# Patient Record
Sex: Male | Born: 1955 | Race: White | Hispanic: No | Marital: Married | Smoking: Never smoker
Health system: Southern US, Community
[De-identification: ages and names within clinical notes are randomized; demographics above are authoritative.]

## PROBLEM LIST (undated history)

## (undated) DIAGNOSIS — I739 Peripheral vascular disease, unspecified: Secondary | ICD-10-CM

## (undated) DIAGNOSIS — I219 Acute myocardial infarction, unspecified: Secondary | ICD-10-CM

## (undated) DIAGNOSIS — M5020 Other cervical disc displacement, unspecified cervical region: Secondary | ICD-10-CM

## (undated) DIAGNOSIS — M25569 Pain in unspecified knee: Secondary | ICD-10-CM

## (undated) DIAGNOSIS — I6529 Occlusion and stenosis of unspecified carotid artery: Secondary | ICD-10-CM

## (undated) DIAGNOSIS — I10 Essential (primary) hypertension: Secondary | ICD-10-CM

## (undated) DIAGNOSIS — Z8669 Personal history of other diseases of the nervous system and sense organs: Secondary | ICD-10-CM

## (undated) DIAGNOSIS — R0602 Shortness of breath: Secondary | ICD-10-CM

## (undated) DIAGNOSIS — Z87442 Personal history of urinary calculi: Secondary | ICD-10-CM

## (undated) DIAGNOSIS — E669 Obesity, unspecified: Secondary | ICD-10-CM

## (undated) DIAGNOSIS — E785 Hyperlipidemia, unspecified: Secondary | ICD-10-CM

## (undated) HISTORY — DX: Personal history of other diseases of the nervous system and sense organs: Z86.69

## (undated) HISTORY — PX: BACK SURGERY: SHX140

## (undated) HISTORY — DX: Shortness of breath: R06.02

## (undated) HISTORY — PX: OTHER SURGICAL HISTORY: SHX169

## (undated) HISTORY — DX: Hyperlipidemia, unspecified: E78.5

## (undated) HISTORY — DX: Peripheral vascular disease, unspecified: I73.9

## (undated) HISTORY — PX: CHOLECYSTECTOMY: SHX55

## (undated) HISTORY — DX: Occlusion and stenosis of unspecified carotid artery: I65.29

## (undated) HISTORY — DX: Obesity, unspecified: E66.9

## (undated) HISTORY — DX: Pain in unspecified knee: M25.569

## (undated) HISTORY — PX: TOTAL KNEE ARTHROPLASTY: SHX125

---

## 1998-06-11 ENCOUNTER — Observation Stay (HOSPITAL_COMMUNITY): Admission: RE | Admit: 1998-06-11 | Discharge: 1998-06-12 | Payer: Self-pay | Admitting: Surgery

## 2002-01-08 ENCOUNTER — Observation Stay (HOSPITAL_COMMUNITY): Admission: AD | Admit: 2002-01-08 | Discharge: 2002-01-09 | Payer: Self-pay | Admitting: Orthopedic Surgery

## 2002-01-08 ENCOUNTER — Encounter: Payer: Self-pay | Admitting: Orthopedic Surgery

## 2002-10-01 ENCOUNTER — Ambulatory Visit (HOSPITAL_COMMUNITY): Admission: RE | Admit: 2002-10-01 | Discharge: 2002-10-01 | Payer: Self-pay | Admitting: Orthopedic Surgery

## 2003-08-23 ENCOUNTER — Emergency Department (HOSPITAL_COMMUNITY): Admission: EM | Admit: 2003-08-23 | Discharge: 2003-08-23 | Payer: Self-pay | Admitting: Emergency Medicine

## 2005-01-13 ENCOUNTER — Ambulatory Visit (HOSPITAL_COMMUNITY): Admission: RE | Admit: 2005-01-13 | Discharge: 2005-01-13 | Payer: Self-pay | Admitting: Gastroenterology

## 2007-02-26 ENCOUNTER — Inpatient Hospital Stay (HOSPITAL_COMMUNITY): Admission: EM | Admit: 2007-02-26 | Discharge: 2007-02-27 | Payer: Self-pay | Admitting: Family Medicine

## 2007-03-31 ENCOUNTER — Emergency Department (HOSPITAL_COMMUNITY): Admission: EM | Admit: 2007-03-31 | Discharge: 2007-03-31 | Payer: Self-pay | Admitting: Emergency Medicine

## 2007-04-05 ENCOUNTER — Encounter (INDEPENDENT_AMBULATORY_CARE_PROVIDER_SITE_OTHER): Payer: Self-pay | Admitting: Gastroenterology

## 2007-04-05 ENCOUNTER — Ambulatory Visit (HOSPITAL_COMMUNITY): Admission: RE | Admit: 2007-04-05 | Discharge: 2007-04-05 | Payer: Self-pay | Admitting: Gastroenterology

## 2008-02-12 ENCOUNTER — Inpatient Hospital Stay (HOSPITAL_COMMUNITY): Admission: RE | Admit: 2008-02-12 | Discharge: 2008-02-16 | Payer: Self-pay | Admitting: Orthopedic Surgery

## 2010-02-18 ENCOUNTER — Ambulatory Visit (HOSPITAL_COMMUNITY): Admission: RE | Admit: 2010-02-18 | Discharge: 2010-02-18 | Payer: Self-pay | Admitting: Gastroenterology

## 2010-03-12 ENCOUNTER — Encounter: Payer: Self-pay | Admitting: Pulmonary Disease

## 2010-07-21 ENCOUNTER — Ambulatory Visit: Payer: Self-pay | Admitting: Pulmonary Disease

## 2010-07-21 DIAGNOSIS — I219 Acute myocardial infarction, unspecified: Secondary | ICD-10-CM | POA: Insufficient documentation

## 2010-07-21 DIAGNOSIS — J984 Other disorders of lung: Secondary | ICD-10-CM | POA: Insufficient documentation

## 2010-08-15 ENCOUNTER — Telehealth (INDEPENDENT_AMBULATORY_CARE_PROVIDER_SITE_OTHER): Payer: Self-pay | Admitting: *Deleted
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
## 2010-11-08 NOTE — Progress Notes (Signed)
  Phone Note Other Incoming   Request: Send information Summary of Call: Request for records received from [facility], [place]. Request forwarded to [facility].      Appended Document:  After research, there was not enough information to forward to [employer]. I faxed 2 pages to [phone].

## 2011-02-21 NOTE — Discharge Summary (Signed)
NAME:  DEMONTRAY, FRANTA NO.:  [id number]   MEDICAL RECORD NO.:  [id number]          PATIENT TYPE:  INP   LOCATION:  3737                         FACILITY:  [facility]   PHYSICIAN:  Vesta Mixer, M.D. DATE OF BIRTH:  Mar 06, 1956   DATE OF ADMISSION:  02/26/2007  DATE OF DISCHARGE:  02/27/2007                               DISCHARGE SUMMARY   DISCHARGE DIAGNOSIS:  1. Chest pain, most likely noncardiac.  2. History of cholecystectomy.  3. History of depression.  4. Gastroesophageal reflux disease.   DISCHARGE MEDICATIONS:  1. Zoloft 20 mg a day.  2. Aspirin 81 mg a day.  3. Nitroglycerin 0.4 mg sublingually as needed.  4. Prilosec 20 mg a day.   DISPOSITION:  The patient is to come to my office tomorrow.  Will set up  a stress Cardiolite study at that time.  He is to call sooner if he has  any worsening problems.   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  He started having episodes of chest pain yesterday at 6 p.m..  He presented to the hospital last night and was admitted for further  evaluation.  Please see dictated H&P for further details.   HOSPITAL COURSE:  Per problems.  1. Chest pain.  The patient had very small Q-waves in leads III and      aVF consistent with possibly an old inferior wall myocardial      infarction.  He ruled out for myocardial infarction during this      admission.  His troponin levels and CPK levels were all very low.      His lipid levels were within normal limits.  We discontinued the      nitroglycerin and heparin.  This morning he has been free of chest      pain throughout the whole day.  He is been up ambulating without      any problems.  Will see him back in the office for a 2-day stress      Cardiolite study starting on Friday.  We will address is other      medical problems at that time.           ______________________________  Vesta Mixer, M.D.     PJN/MEDQ  D:  02/27/2007   T:  02/28/2007  Job:  [id number]
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
## 2011-02-21 NOTE — Op Note (Signed)
NAME:  BALDOMERO, MIRARCHI NO.:  [id number]   MEDICAL RECORD NO.:  [id number]          PATIENT TYPE:  INP   LOCATION:  0001                         FACILITY:  [facility]   PHYSICIAN:  Georges Lynch. Gioffre, M.D.DATE OF BIRTH:  1956/05/01   DATE OF PROCEDURE:  02/12/2008  DATE OF DISCHARGE:                               OPERATIVE REPORT   SURGEON:  Georges Lynch. Darrelyn Hillock, M.D.   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   NOTE:  We previously arthroscoped his knee on the left.   SYSTEM USED:  The DePuy system was a rotating platform.  We utilized  vancomycin in the cement.  The patella was a 3-pegged 35-mm patella.  The tibial tray insert was a size 10-mm thickness, size 4.  The femoral  component was a size 4 left posterior cruciate, sacrificing type.  The  tibial tray was a size 3.  All 3 components were cemented.   PROCEDURE:  Prior to general anesthesia, he had a left femoral nerve  block.  He was taken back to surgery.  Under general anesthesia, a  routine orthopedic prep and draping of the left lower extremity was  carried out.  The leg was exsanguinated with an Esmarch and tourniquet  was elevated at 350 mmHg.  This time, the knee was flexed and an  incision was made over the anterior aspect of the left knee, bleeders  identified and cauterized.  Two flaps were created.  I then went down  and did a median parapatellar approach, reflected the patella laterally,  flexed the knee and made our initial drill hole in the intercondylar  notch of the femur.  Following that, we removed 11-mm thickness off the  distal femur.  We then inserted our sizing guide and measured the femur  to be a size 4 left.  We then inserted our next jig and carried out  anterior and posterior cuts in the usual fashion of the femur for a  size  4 left femur.  After we prepared the femur in the usual fashion, we then  went down and prepared the tibia for a size 3 tibia.  We cut a keel cut  out of the tibial plateau.  We removed approximately 6-mm thickness from  the tibial plateau initially using the medial side of the knee as our  guide.  We did utilize the intramedullary guide system.  After the tibia  was thoroughly prepared, we went back and cut our notch cut of the femur  for a size 4 femur.  Note, when we cut our femur initially, we did cut  anterior, posterior and chamfering cuts in the usual fashion.  After the  femur and tibia were prepared, we thoroughly irrigated out the area.  We  utilized our spacer guides to make sure that we had the appropriate  tension and flexion and extension.  We also checked posteriorly to make  sure  there were no spurs present.  Following that, we then went on and  inserted our trial components and we had an excellent fit.  With the  knee components in, we then measured our patella to be a size 35  patella.  I then resurfaced the patella in the usual fashion and 3 drill  holes were made in the patella for the patellar component.  We did go  through the appropriate measurements of the patella with the calipers.  Following this, all 3 components were removed.  We thoroughly irrigated  out the area.  Gelfoam was inserted up into the femoral canal and down  into the tibial canal.  We bone-waxed the outer holes of the tibia where  the pins were inserted.  We cleaned out the knee, waterpicked the knee  and dried the knee out and cemented all 3 components in simultaneously.  All loose pieces of cement were removed.  We made sure after the cement  was hardened, there were no other loose pieces posteriorly as well.  Following that, we thoroughly irrigated out the knee.  We did go through  the trial with the size 4 ten-millimeter thickness tibial insert and we  had excellent stability, good  flexion and extension and good medial and  lateral stability, so we then removed the trial component, once again  looked for cement, irrigated the knee out and inserted our permanent  rotating platform component and reduced the knee and had good motion and  good stability.  We irrigated the knee out prior to inserting the  components.  We injected about 20 mL of 0.25% Marcaine with epinephrine  and 30 mg of Toradol.  As we inserted a Hemovac drain, and knee was  closed in layers in the usual fashion.  Note, he had 2 grams of IV Ancef  preop.           ______________________________  Georges Lynch. Darrelyn Hillock, M.D.     RAG/MEDQ  D:  02/12/2008  T:  02/12/2008  Job:  [id number]

## 2011-02-21 NOTE — Op Note (Signed)
NAME:  Tanner Phillips, Tanner Phillips NO.:  [id number]   MEDICAL RECORD NO.:  [id number]          PATIENT TYPE:  AMB   LOCATION:  ENDO                         FACILITY:  [facility]   PHYSICIAN:  Anselmo Rod, M.D.  DATE OF BIRTH:  May 18, 1956   DATE OF PROCEDURE:  04/05/2007  DATE OF DISCHARGE:                               OPERATIVE REPORT   PROCEDURE PERFORMED:  Esophagogastroduodenoscopy with multiple cold  biopsies.   ENDOSCOPIST:  Anselmo Rod, M.D.   INSTRUMENT USED:  Pentax video panendoscope.   INDICATION FOR PROCEDURE:  A 55 year old white male with a history of  chest pain, reflux undergoing an EGD to rule out esophagitis, peptic  ulcer disease, etc.   PREPROCEDURE PREPARATION:  Informed consent was procured from the  patient.  The patient was fasted for 8 hours prior to the procedure.  The risks and benefits of the procedure were discussed with the patient  in great detail.   PREPROCEDURE PHYSICAL:  The patient had stable vital signs.  NECK:  Supple.  CHEST:  Clear to auscultation.  S1, S2 regular.  ABDOMEN:  Soft with normal bowel sounds.   DESCRIPTION OF THE PROCEDURE:  The patient was placed in the left  lateral decubitus position, sedated with 70 mcg of Fentanyl and 5 mg of  Versed given intravenously in incremental doses. Once the patient was  adequately sedated and maintained on low flow oxygen, continuous cardiac  monitoring the Pentax video panendoscope was advanced through the  mouthpiece over the tongue, into the esophagus under direct vision.  The  proximal esophagus seemed somewhat abnormal with a large patch of what  appears to be Barrett's-like mucosa. This was biopsied for pathology.  On advancing the scope into the mid esophagus, a linear erosion was  biopsied from 30 cm. Mild antral gastritis was appreciated, biopsies  were done to rule out presence of H. pylori by pathology. The proximal  small bowel appeared normal.  There was no outlet  obstruction. No  abnormaility noted on retroflexion in the cardia.  The Z-line appeared  healthy.  The patient tolerated the procedure without complications.   IMPRESSION:  1. ?Barrett's mucosa at the GEJ, biopsies done.  2. Linear erosion biopsied at 30 centimeters.  3. Mild antral gastritis, biopsies done for Helicobacter pylori.  4. Normal proximal small bowel.  5. No evidence of hiatal hernia.   RECOMMENDATIONS:  1. Await pathology results.  2. Avoid nonsteroidals for now.  3. Soft diet for the next couple of days.  4. Continue PPIs as before.  5. Outpatient followup as need arises in the future.  6. Outpatient followup on a PRN basis.      Anselmo Rod, M.D.  Electronically Signed     JNM/MEDQ  D:  04/05/2007  T:  04/05/2007  Job:  [id number]   cc:   Gabriel Earing, M.D.  Fax: [phone]   Vesta Mixer, M.D.  Fax: [phone]

## 2011-02-21 NOTE — H&P (Signed)
NAME:  Tanner Phillips, Tanner Phillips NO.:  [id number]   MEDICAL RECORD NO.:  [id number]          PATIENT TYPE:   LOCATION:                                 FACILITY:   PHYSICIAN:  Georges Lynch. Gioffre, M.D.DATE OF BIRTH:  Oct 11, 1955   DATE OF ADMISSION:  02/12/2008  DATE OF DISCHARGE:                              HISTORY & PHYSICAL   CHIEF COMPLAINT:  Painful left knee.   HISTORY OF PRESENT ILLNESS:  Tanner Phillips is a patient of Dr. Ranee Gosselin, a 55 year old gentleman here with left knee pain.  He initially  incurred a workman's comp injury to the left knee.  He had an  arthroscopy, found to have some degenerative changes within the knee.  The patient has failed conservative treatment.  The patient has elected  to proceed with a left total knee arthroplasty.   ALLERGIES:  None.   CURRENT MEDICATIONS:  Celebrex 200 mg a day.   PAST MEDICAL HISTORY:  1. History of kidney stones 15 years previous.  2. Knee pain with degenerative changes, failed conservative treatment.   PAST SURGICAL HISTORY:  Includes back surgery, both right and left knee  arthroscopies, cholecystectomy without any complications.   REVIEW OF SYSTEMS:  The review of systems is negative for any other  hematologic, neurologic, pulmonary.  The patient reports question  whether he had a heart attack in the past but recent cardiac exam workup  finds no evidence of damage and has been cleared for surgery.  GI, GU  issues are unremarkable.  Last kidney stone 15 years previous.   FAMILY MEDICAL HISTORY:  Mother is alive and in good health at the age  of 67.  Father has got significant COPD, emphysema and history of  gallbladder cancer.   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  He does  not smoke or use alcohol.  Lives in a [place] house.   PHYSICAL EXAM:  VITAL SIGNS:  Height 5 feet 5 inches, weight is 240  pounds, blood pressure is 136/88, pulse of 70 and regular, respirations  12  and nonlabored.  The patient is afebrile.  GENERAL:  This is a healthy-appearing, well-developed gentleman slightly  centrally heavy-set, appears to be in no extreme distress.  Ambulates  with a fairly easy balanced gait.  HEENT:  Pupils equal, round and reactive.  Gross hearing is intact.  NECK:  Neck was supple.  Good range of motion.  No palpable  lymphadenopathy.  CHEST:  Lung sounds were clear and equal throughout.  HEART:  Regular rate and rhythm.  No murmurs, rubs or gallops.  ABDOMEN:  Soft, obese, nontender.  EXTREMITIES:  Upper extremities had excellent range of motion of the  shoulders, elbows and wrists.  Motor strength was 5/5.  LOWER EXTREMITIES:  Right and left hip had full extension, flexion to  130, 20-30 degrees internal-external rotation without any discomfort.  Right knee had full extension, flexion back to 130 degrees, no ligament  instability.  Left knee had full extension, flexion back to about 120.  He had painful  range of motion.  He had no ligament instability.  The  calf was soft and nontender.  Ankles had good range of motion.  PERIPHERAL VASCULAR:  Carotid pulses were 2+, no bruits.  Radial pulses  were 2+.  Posterior tibial pulses were 2+.  He had no lower extremity  edema.  NEUROLOGICAL:  The patient was conscious, alert and appropriate.  He had  no gross neurologic defects noted.  BREASTS:  Deferred at this time.  RECTAL:  Deferred at this time.  GU:  Deferred at this time.   IMPRESSION:  1. 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  2. History of kidney stones.   PLAN:  The patient will undergo all routine labs and tests prior to  having a left total knee arthroplasty by Dr. Darrelyn Hillock at [facility] on Feb 12, 2008.      Jamelle Rushing, P.A.    ______________________________  Georges Lynch Darrelyn Hillock, M.D.    RWK/MEDQ  D:  02/07/2008  T:  02/07/2008  Job:  [id number]   cc:   Windy Fast A. Darrelyn Hillock, M.D.  Fax: [phone]

## 2011-02-24 NOTE — Op Note (Signed)
NAME:  Tanner Phillips, Tanner Phillips                            ACCOUNT NO.:  [id number]   MEDICAL RECORD NO.:  [id number]                   PATIENT TYPE:  AMB   LOCATION:  DAY                                  FACILITY:  [facility] [place]   PHYSICIAN:  Georges Lynch. Darrelyn Hillock, M.D.             DATE OF BIRTH:  Dec 21, 1955   DATE OF PROCEDURE:  10/01/2002  DATE OF DISCHARGE:                                 OPERATIVE REPORT   SURGEON:  Georges Lynch. Darrelyn Hillock, M.D.   ASSISTANT:  Nurse.   PREOPERATIVE DIAGNOSES:  1. Complex tear of the medial meniscus, right knee.  2. Chondromalacia of the medial femoral condyle, right knee.   POSTOPERATIVE DIAGNOSES:  1. Complex tear of the medial meniscus, right knee.  2. Chondromalacia of the medial femoral condyle, right knee.   OPERATION:  1. Diagnostic arthroscopy, right knee.  2. Medial meniscectomy, right knee, of the posterior horn and the anterior     horn.  3. Abrasion chondroplasty of the medial femoral condyle, right knee.   DESCRIPTION OF PROCEDURE:  Under local anesthesia with standby, routine  orthopedic prep and draping of the right knee was carried out.  He had a 1 g  of IV Ancef preoperatively.   At this time a small punctate incision was made in the suprapatellar pouch  and flow cannula was inserted.  The knee was distended with saline.  Another  small punctate incision made in the anterolateral joint.  The arthroscope  was entered and complete diagnostic arthroscopy was carried out.  The  lateral joint was sliding through the patellar area, which was the only  pathology noted, but he had a severe chondromalacia of his medial femoral  condyle.  We introduced the shaver sucker to do the right knee chondroplasty  #2.  I did note that he had a large complex tear of the posterior horn of  the medial meniscus, as well as a tear of the anterior horn.  I did a  partial medial meniscectomy of the posterior horn and the anterior horn.  His posterior horn was severely torn.   I thoroughly irrigated out the area  and removed the fluid, closed all three punctate incisions with 3-0 nylon  suture.  I injected 30 cc of  0.5% Marcaine with epinephrine to the knee  joint.  Sterile Neosporin and bundle dressing was applied.  The patient went  to the operating room in satisfactory condition.   FOLLOW-UP CARE:  He will be on Percocet 10-650 for pain, one q.4h. p.r.n.  He will be on Bufferin one b.i.d. for two weeks as an anticoagulant.  He  will be on crutches with partial weightbearing.  I will see him in two weeks  or prior to that for follow-up.  Ronald A. Darrelyn Hillock, M.D.   RAG/MEDQ  D:  10/01/2002  T:  10/02/2002  Job:  [id number]

## 2011-02-24 NOTE — Discharge Summary (Signed)
NAME:  Tanner Phillips, Tanner Phillips NO.:  [id number]   MEDICAL RECORD NO.:  [id number]          PATIENT TYPE:  INP   LOCATION:  1613                         FACILITY:  [facility]   PHYSICIAN:  Georges Lynch. Gioffre, M.D.DATE OF BIRTH:  [birth year]   DATE OF ADMISSION:  02/12/2008  DATE OF DISCHARGE:  02/16/2008                               DISCHARGE SUMMARY   ADMISSION DIAGNOSES:  1. Severe osteoarthritis, left knee.  2. History of kidney stones.   DISCHARGE DIAGNOSES:  1. Left total knee arthroplasty.  2. History of kidney stones.   HISTORY OF PRESENT ILLNESS:  The patient is a 55 year old gentleman who  incurred a Workman's Comp injury to his left knee.  The patient had  pain.  Had an arthroscopy, was found to have degenerative changes.  The  patient has failed conservative treatment.  He continues to have pain  with range of motion and weightbearing.  The patient has elected to  proceed with a left total knee arthroplasty.   ALLERGIES:  No known drug allergies.   CURRENT MEDICATIONS:  Celebrex 200 mg a day.   SURGICAL PROCEDURE:  On Feb 12, 2008, the patient was taken to the OR by  Dr. Worthy Rancher, assisted by Oneida Alar, PA-C.  Under general anesthesia,  the patient underwent a left total knee arthroplasty with a DePuy  rotating platform system.  The patient tolerated the procedure well.  There were no complications.  The patient was transferred to the  recovery room in good condition.  The patient had the following  components implanted:  The patient had a size 4 left femoral component,  a size 3  cemented keel tibial tray, a size 4 ten-millimeter thickness  polyethylene bearing, a size 35 three-peg patella.  All components were  implanted with polymethylmethacrylate with vancomycin mixed in.   CONSULTS:  The following routine consults were requested:  Physical  therapy, case management, pharmacy.   HOSPITAL COURSE:  On Feb 12, 2008, the patient was admitted to [facility] under the care of Dr. Worthy Rancher.  The patient was taken  to the OR,  where a left total knee arthroplasty was performed without  any complications.  The patient was transferred to the recovery room and  then to the orthopedic floor in good condition with IV antibiotics, pain  medicines and Coumadin for DVT prophylaxis.  Followed total knee  protocol.  The patient then incurred a 4-day postoperative course in  which the patient had no significant untoward events.  The patient was  able to transition off of  IV medications well.  The patient worked well  with physical therapy.  The patient did have some issues related to a  little bit of a postoperative sleep issues, and he was transitioned over  from Restoril to Ambien with good results.  The patient's wound remained  benign for any signs of infection.  His leg remained neuromotor,  vascularly intact.  The patient worked well with physical therapy and  was able to ambulate in excess of 120 feet without difficulty, so the  patient was arranged for discharge home with outpatient home health  physical therapy and Coumadin for DVT prophylaxis.   LABORATORIES:  CBC on admission found WBC 5.2, hemoglobin 16.3,  hematocrit 47.5, platelets 224.  His H&H on date of discharge was 13.3  and 39.5.  INR was 1.6 on discharge.  The patient's chemistries on  postop day #2 found sodium of 136, potassium of 4.3, glucose 112, BUN 8,  creatinine was 1.04 with an estimated GFR greater than 60.  Admission  urinalysis was negative.   DISCHARGE INSTRUCTIONS:  1. Diet:  No restrictions.  2. Activity:  The patient is to walk with the assistance of a walker.  3. Wound care:  The patient is to change his dressing daily.  4. Followup:  The patient needs a followup appointment with Dr.      Darrelyn Hillock in 2 weeks from date of surgery.  The patient is to call      for appointment.   MEDICATIONS:  1. Percocet 10/650 one tablet every four to six hours for  pain.  2. Coumadin 5 mg a day unless changed by home health pharmacy.  3. Robaxin 500 mg once every six hours for muscle spasms if needed.  4. Ambien 10 mg once at nighttime if needed.  5. The patient is not to take his Celebrex, Motrin and aspirin while      is on the Coumadin.   [facility] for managing Coumadin and home health physical  therapy.   The patient's condition upon discharge to home is listed as improved.      Jamelle Rushing, P.A.    ______________________________  Georges Lynch Darrelyn Hillock, M.D.    RWK/MEDQ  D:  03/03/2008  T:  03/03/2008  Job:  [id number]

## 2011-02-24 NOTE — Op Note (Signed)
NAME:  Tanner Phillips, Tanner Phillips NO.:  [id number]   MEDICAL RECORD NO.:  [id number]          PATIENT TYPE:  AMB   LOCATION:  ENDO                         FACILITY:  [facility]   PHYSICIAN:  Anselmo Rod, M.D.  DATE OF BIRTH:  August 05, 1956   DATE OF PROCEDURE:  01/13/2005  DATE OF DISCHARGE:                                 OPERATIVE REPORT   PROCEDURE PERFORMED:  Screening colonoscopy.   ENDOSCOPIST:  Anselmo Rod, M.D.   INSTRUMENT USED:  Olympus video colonoscope.   INDICATION FOR PROCEDURE:  A 55 year old white male undergoing a screening  colonoscopy.  Rule out colonic polyps, masses, etc.   PREPROCEDURE PREPARATION:  Informed consent was procured from the patient.  The patient was fasted for eight hours prior to the procedure and prepped  with a bottle of magnesium citrate and a gallon of GoLYTELY the night prior  to the procedure.  The risks and benefits of the procedure, including a 10%  miss rate of cancer and polyps, were discussed with her as well.   PREPROCEDURE PHYSICAL:  VITAL SIGNS:  The patient had stable vital signs.  NECK:  Supple.  CHEST:  Clear to auscultation.  S1, S2 regular.  ABDOMEN:  Soft with normal bowel sounds.   DESCRIPTION OF PROCEDURE:  The patient was placed in the left lateral  decubitus position and sedated with 70 mg of Demerol and 10 mg of Versed in  slow incremental doses.  Once the patient was adequately sedate and  maintained on low-flow oxygen and continuous cardiac monitoring, the Olympus  video colonoscope was advanced from the rectum to the cecum.  The  appendiceal orifice and the ileocecal valve were clearly visualized and  photographed.  The terminal ileum appeared healthy and without lesions.  There was some residual stool in the colon.  Multiple washes were done.  No  masses, polyps, erosions, ulcerations or diverticula were seen.  Small  internal hemorrhoids were appreciated on retroflexion in the rectum.  The  patient  tolerated the procedure well without complications.   IMPRESSION:  Normal colonoscopy to the terminal ileum except for small,  nonbleeding internal hemorrhoids.  No masses, polyps or diverticula seen.   RECOMMENDATIONS:  1.  Continue a high-fiber diet with liberal fluid intake.  2.  Repeat colonoscopy in the next five years unless the patient develops      any abnormal symptoms in the interim.  3.  Outpatient follow-up as the need arises in the future.      JNM/MEDQ  D:  01/13/2005  T:  01/13/2005  Job:  [id number]   cc:   Gabriel Earing, M.D.  [address]  [place]  [place] [address]  Fax: [phone]

## 2011-02-24 NOTE — Op Note (Signed)
[facility]  Patient:    Tanner Phillips, Tanner Phillips Visit Number: [id number] MRN: [id number]          Service Type: Attending:  Georges Lynch. Darrelyn Hillock, M.D. Dictated by:   Georges Lynch Darrelyn Hillock, M.D. Proc. Date: 01/08/02                             Operative Report  SURGEON:  Windy Fast A. Darrelyn Hillock, M.D.  ASSISTANT:  Javier Docker, M.D.  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  OPERATION PERFORMED:  Hemilaminectomy and microdiskectomy and partial facetectomy and foraminotomy at 5-1 on the right.  DESCRIPTION OF PROCEDURE:  Under general anesthesia, routine orthopedic prep and draping of the lower back was carried out. The patient had 1 gm of IV Ancef. I did an initial prep and then a sterile prep of his back and two x-rays were taken after markers were placed on the back on two successive times. We verified the L5-S1 interspace, went down and separated the muscle from the lamina. We inserted self retaining retractors and carried out a hemilaminectomy. We first did a foraminotomy for the S1 root. We explored the S1 root and it was fine. We went up to the disk space at 5-1 and then went out and did a partial facetectomy out laterally in order to remove the foraminal disk herniation. A cruciate incision was made in the posterior longitudinal ligament. I went down and did a complete diskectomy and then we went out laterally and removed all the disk material. Following that, we were able to easily pass the hockey stick up under the 5 root with no problem. There was no compression left laterally. We made multiple passes into the disk space at 5-1. The disk space was markedly narrowed. We thoroughly irrigated out the area, good hemostasis was maintained with the  bipolar cautery. We then loosely applied some thrombin soaked Gelfoam and closed the wound in layers in the usual fashion. The skin was closed with metal staples and sterile Neosporin dressing was applied. The patient left the operating room in satisfactory condition. Dictated by:   Georges Lynch Darrelyn Hillock, M.D. Attending:  Georges Lynch. Darrelyn Hillock, M.D. DD:  01/08/02 TD:  01/09/02 Job: [id number]

## 2011-07-05 LAB — BASIC METABOLIC PANEL
BUN: 14
BUN: 8
CO2: 25
CO2: 27
Calcium: 8.1 — ABNORMAL LOW
Calcium: 8.3 — ABNORMAL LOW
Chloride: 103
Chloride: 107
Creatinine, Ser: 1.04
Creatinine, Ser: 1.18
GFR calc Af Amer: >60
GFR calc Af Amer: >60
GFR calc non Af Amer: >60
GFR calc non Af Amer: >60
Glucose, Bld: 112 — ABNORMAL HIGH
Glucose, Bld: 113 — ABNORMAL HIGH
Potassium: 3.8
Potassium: 4.3
Sodium: 136
Sodium: 137

## 2011-07-05 LAB — PROTIME-INR
INR: 0.9
INR: 1.1
INR: 1.4
INR: 1.5
INR: 1.6 — ABNORMAL HIGH
Prothrombin Time: 12.2
Prothrombin Time: 14.8
Prothrombin Time: 17.9 — ABNORMAL HIGH
Prothrombin Time: 18.8 — ABNORMAL HIGH
Prothrombin Time: 19.4 — ABNORMAL HIGH

## 2011-07-05 LAB — CBC
HCT: 38.9 — ABNORMAL LOW
HCT: 39.3
HCT: 39.5
HCT: 47.5
Hemoglobin: 13.2
Hemoglobin: 13.3
Hemoglobin: 13.4
Hemoglobin: 16.3
MCHC: 33.6
MCHC: 34
MCHC: 34
MCHC: 34.4
MCV: 84.3
MCV: 84.6
MCV: 84.8
MCV: 85.3
Platelets: 163
Platelets: 166
Platelets: 197
Platelets: 224
RBC: 4.59
RBC: 4.63
RBC: 4.64
RBC: 5.63
RDW: 12.7
RDW: 13.2
RDW: 13.4
RDW: 13.5
WBC: 5.2
WBC: 7.5
WBC: 8.1
WBC: 8.1

## 2011-07-05 LAB — COMPREHENSIVE METABOLIC PANEL
ALT: 46
AST: 29
Albumin: 4
Alkaline Phosphatase: 55
BUN: 12
CO2: 25
Calcium: 9.2
Chloride: 108
Creatinine, Ser: 1.16
GFR calc Af Amer: >60
GFR calc non Af Amer: >60
Glucose, Bld: 103 — ABNORMAL HIGH
Potassium: 4
Sodium: 140
Total Bilirubin: 0.6
Total Protein: 6.2

## 2011-07-05 LAB — TYPE AND SCREEN
ABO/RH(D): A POS
Antibody Screen: NEGATIVE

## 2011-07-05 LAB — URINALYSIS, ROUTINE W REFLEX MICROSCOPIC
Bilirubin Urine: NEGATIVE
Glucose, UA: NEGATIVE
Hgb urine dipstick: NEGATIVE
Ketones, ur: NEGATIVE
Nitrite: NEGATIVE
Protein, ur: NEGATIVE
Specific Gravity, Urine: 1.022
Urobilinogen, UA: 0.2
pH: 5.5

## 2011-07-05 LAB — HEMOGLOBIN AND HEMATOCRIT, BLOOD
HCT: 44.2
Hemoglobin: 15.1

## 2011-07-05 LAB — DIFFERENTIAL
Basophils Absolute: 0
Basophils Relative: 1
Eosinophils Absolute: 0.2
Eosinophils Relative: 3
Lymphocytes Relative: 27
Lymphs Abs: 1.4
Monocytes Absolute: 0.6
Monocytes Relative: 12
Neutro Abs: 3
Neutrophils Relative %: 57

## 2011-07-05 LAB — ABO/RH: ABO/RH(D): A POS

## 2011-07-05 LAB — APTT: aPTT: 23 — ABNORMAL LOW

## 2011-07-26 LAB — POCT CARDIAC MARKERS
CKMB, poc: <1 — ABNORMAL LOW
CKMB, poc: <1 — ABNORMAL LOW
Myoglobin, poc: 58.8
Myoglobin, poc: 64
Operator id: 151321
Operator id: 151321
Troponin i, poc: <0.05
Troponin i, poc: <0.05

## 2011-07-26 LAB — BASIC METABOLIC PANEL
BUN: 11
Calcium: 8.9
Creatinine, Ser: 0.97
GFR calc non Af Amer: >60

## 2011-07-26 LAB — CBC
MCV: 85.4
Platelets: 225
WBC: 7.7

## 2012-03-15 ENCOUNTER — Encounter: Payer: Self-pay | Admitting: Cardiovascular Disease

## 2012-05-24 ENCOUNTER — Other Ambulatory Visit: Payer: Self-pay | Admitting: Physician Assistant

## 2015-03-18 ENCOUNTER — Other Ambulatory Visit: Payer: Self-pay | Admitting: Gastroenterology

## 2015-05-31 ENCOUNTER — Encounter (HOSPITAL_COMMUNITY): Payer: Self-pay | Admitting: *Deleted

## 2015-06-03 ENCOUNTER — Ambulatory Visit (HOSPITAL_COMMUNITY): Payer: BLUE CROSS/BLUE SHIELD | Admitting: Certified Registered"

## 2015-06-03 ENCOUNTER — Encounter (HOSPITAL_COMMUNITY)
Admission: RE | Disposition: A | Discharge status: Home / Self Care | Payer: Self-pay | Source: Ambulatory Visit | Attending: Gastroenterology

## 2015-06-03 ENCOUNTER — Encounter (HOSPITAL_COMMUNITY): Payer: Self-pay

## 2015-06-03 ENCOUNTER — Ambulatory Visit (HOSPITAL_COMMUNITY)
Admission: RE | Admit: 2015-06-03 | Discharge: 2015-06-03 | Disposition: A | Discharge status: Home / Self Care | Payer: BLUE CROSS/BLUE SHIELD | Source: Ambulatory Visit | Attending: Gastroenterology | Admitting: Gastroenterology

## 2015-06-03 DIAGNOSIS — Z800 Family history of malignant neoplasm of digestive organs: Secondary | ICD-10-CM | POA: Diagnosis not present

## 2015-06-03 DIAGNOSIS — Z1211 Encounter for screening for malignant neoplasm of colon: Secondary | ICD-10-CM | POA: Insufficient documentation

## 2015-06-03 DIAGNOSIS — E669 Obesity, unspecified: Secondary | ICD-10-CM | POA: Diagnosis not present

## 2015-06-03 DIAGNOSIS — D120 Benign neoplasm of cecum: Secondary | ICD-10-CM | POA: Insufficient documentation

## 2015-06-03 DIAGNOSIS — Z79899 Other long term (current) drug therapy: Secondary | ICD-10-CM | POA: Diagnosis not present

## 2015-06-03 DIAGNOSIS — G4730 Sleep apnea, unspecified: Secondary | ICD-10-CM | POA: Diagnosis not present

## 2015-06-03 DIAGNOSIS — K5730 Diverticulosis of large intestine without perforation or abscess without bleeding: Secondary | ICD-10-CM | POA: Diagnosis not present

## 2015-06-03 DIAGNOSIS — I252 Old myocardial infarction: Secondary | ICD-10-CM | POA: Diagnosis not present

## 2015-06-03 DIAGNOSIS — I2510 Atherosclerotic heart disease of native coronary artery without angina pectoris: Secondary | ICD-10-CM | POA: Insufficient documentation

## 2015-06-03 DIAGNOSIS — Z6839 Body mass index (BMI) 39.0-39.9, adult: Secondary | ICD-10-CM | POA: Diagnosis not present

## 2015-06-03 DIAGNOSIS — Z791 Long term (current) use of non-steroidal anti-inflammatories (NSAID): Secondary | ICD-10-CM | POA: Insufficient documentation

## 2015-06-03 HISTORY — PX: COLONOSCOPY WITH PROPOFOL: SHX5780

## 2015-06-03 HISTORY — DX: Acute myocardial infarction, unspecified: I21.9

## 2015-06-03 SURGERY — COLONOSCOPY WITH PROPOFOL
Anesthesia: Monitor Anesthesia Care

## 2015-06-03 MED ORDER — LIDOCAINE HCL (PF) 2 % IJ SOLN
INTRAMUSCULAR | Status: DC | PRN
  Administered 2015-06-03: 20 mg via INTRADERMAL

## 2015-06-03 MED ORDER — PROPOFOL 10 MG/ML IV BOLUS
INTRAVENOUS | Status: AC
Start: 2015-06-03 — End: 2015-06-03
  Filled 2015-06-03: qty 20

## 2015-06-03 MED ORDER — PROPOFOL 10 MG/ML IV BOLUS
INTRAVENOUS | Status: AC
  Filled 2015-06-03: qty 20

## 2015-06-03 MED ORDER — PROPOFOL 10 MG/ML IV BOLUS
INTRAVENOUS | Status: DC | PRN
  Administered 2015-06-03: 50 mg via INTRAVENOUS
  Administered 2015-06-03: 100 mg via INTRAVENOUS
  Administered 2015-06-03: 50 mg via INTRAVENOUS

## 2015-06-03 MED ORDER — LACTATED RINGERS IV SOLN
INTRAVENOUS | Status: DC
  Administered 2015-06-03: 1000 mL via INTRAVENOUS

## 2015-06-03 MED ORDER — LIDOCAINE HCL (CARDIAC) 20 MG/ML IV SOLN
INTRAVENOUS | Status: AC
  Filled 2015-06-03: qty 5

## 2015-06-03 MED ORDER — SODIUM CHLORIDE 0.9 % IV SOLN: INTRAVENOUS | Status: DC

## 2015-06-03 SURGICAL SUPPLY — 21 items

## 2015-06-03 NOTE — Anesthesia Postprocedure Evaluation (Signed)
  Anesthesia Post-op Note  Patient: Tanner Phillips  Procedure(s) Performed: Procedure(s): COLONOSCOPY WITH PROPOFOL (N/A)  Patient Location: PACU  Anesthesia Type:MAC  Level of Consciousness: awake, alert  and oriented  Airway and Oxygen Therapy: Patient Spontanous Breathing  Post-op Pain: none  Post-op Assessment: Post-op Vital signs reviewed and Patient's Cardiovascular Status Stable              Post-op Vital Signs: Reviewed and stable  Last Vitals:  Filed Vitals:   06/03/15 0800  BP: 153/90  Pulse: 68  Temp:   Resp: 23    Complications: No apparent anesthesia complications

## 2015-06-03 NOTE — Transfer of Care (Signed)
Immediate Anesthesia Transfer of Care Note  Patient: Tanner Phillips  Procedure(s) Performed: Procedure(s): COLONOSCOPY WITH PROPOFOL (N/A)  Patient Location: PACU  Anesthesia Type:MAC  Level of Consciousness:  sedated, patient cooperative and responds to stimulation  Airway & Oxygen Therapy:Patient Spontanous Breathing and Patient connected to face mask oxgen  Post-op Assessment:  Report given to PACU RN and Post -op Vital signs reviewed and stable  Post vital signs:  Reviewed and stable  Last Vitals:  Filed Vitals:   06/03/15 0621  BP: 148/81  Pulse: 62  Temp: 36.8 C  Resp: 11    Complications: No apparent anesthesia complications

## 2015-06-03 NOTE — Anesthesia Preprocedure Evaluation (Addendum)
Anesthesia Evaluation  Patient identified by MRN, date of birth, ID band Patient awake    Reviewed: Allergy & Precautions, NPO status , Patient's Chart, lab work & pertinent test results  Airway Mallampati: II  TM Distance: >3 FB Neck ROM: Full    Dental  (+) Teeth Intact   Pulmonary  breath sounds clear to auscultation        Cardiovascular + CAD and + Past MI Rhythm:Regular Rate:Normal     Neuro/Psych negative neurological ROS  negative psych ROS   GI/Hepatic negative GI ROS, Neg liver ROS,   Endo/Other  negative endocrine ROS  Renal/GU negative Renal ROS  negative genitourinary   Musculoskeletal negative musculoskeletal ROS (+)   Abdominal   Peds negative pediatric ROS (+)  Hematology negative hematology ROS (+)   Anesthesia Other Findings   Reproductive/Obstetrics negative OB ROS                            Anesthesia Physical Anesthesia Plan  ASA: II  Anesthesia Plan: MAC   Post-op Pain Management:    Induction: Intravenous  Airway Management Planned: Natural Airway and Simple Face Mask  Additional Equipment:   Intra-op Plan:   Post-operative Plan:   Informed Consent: I have reviewed the patients History and Physical, chart, labs and discussed the procedure including the risks, benefits and alternatives for the proposed anesthesia with the patient or authorized representative who has indicated his/her understanding and acceptance.   Dental advisory given  Plan Discussed with: CRNA  Anesthesia Plan Comments:         Anesthesia Quick Evaluation

## 2015-06-03 NOTE — Discharge Instructions (Signed)
Colon Polyps Polyps are lumps of extra tissue growing inside the body. Polyps can grow in the large intestine (colon). Most colon polyps are noncancerous (benign). However, some colon polyps can become cancerous over time. Polyps that are larger than a pea may be harmful. To be safe, caregivers remove and test all polyps. CAUSES  Polyps form when mutations in the genes cause your cells to grow and divide even though no more tissue is needed. RISK FACTORS There are a number of risk factors that can increase your chances of getting colon polyps. They include:  Being older than 50 years.  Family history of colon polyps or colon cancer.  Long-term colon diseases, such as colitis or Crohn disease.  Being overweight.  Smoking.  Being inactive.  Drinking too much alcohol. SYMPTOMS  Most small polyps do not cause symptoms. If symptoms are present, they may include:  Blood in the stool. The stool may look dark red or black.  Constipation or diarrhea that lasts longer than 1 week. DIAGNOSIS People often do not know they have polyps until their caregiver finds them during a regular checkup. Your caregiver can use 4 tests to check for polyps:  Digital rectal exam. The caregiver wears gloves and feels inside the rectum. This test would find polyps only in the rectum.  Barium enema. The caregiver puts a liquid called barium into your rectum before taking X-rays of your colon. Barium makes your colon look white. Polyps are dark, so they are easy to see in the X-ray pictures.  Sigmoidoscopy. A thin, flexible tube (sigmoidoscope) is placed into your rectum. The sigmoidoscope has a light and tiny camera in it. The caregiver uses the sigmoidoscope to look at the last third of your colon.  Colonoscopy. This test is like sigmoidoscopy, but the caregiver looks at the entire colon. This is the most common method for finding and removing polyps. TREATMENT  Any polyps will be removed during a  sigmoidoscopy or colonoscopy. The polyps are then tested for cancer. PREVENTION  To help lower your risk of getting more colon polyps:  Eat plenty of fruits and vegetables. Avoid eating fatty foods.  Do not smoke.  Avoid drinking alcohol.  Exercise every day.  Lose weight if recommended by your caregiver.  Eat plenty of calcium and folate. Foods that are rich in calcium include milk, cheese, and broccoli. Foods that are rich in folate include chickpeas, kidney beans, and spinach. HOME CARE INSTRUCTIONS Keep all follow-up appointments as directed by your caregiver. You may need periodic exams to check for polyps. SEEK MEDICAL CARE IF: You notice bleeding during a bowel movement. Document Released: 06/21/2004 Document Revised: 12/18/2011 Document Reviewed: 12/05/2011 [facility] Patient Information 2015 [place], [place]. This information is not intended to replace advice given to you by your health care provider. Make sure you discuss any questions you have with your health care provider. Colonoscopy, Care After Refer to this sheet in the next few weeks. These instructions provide you with information on caring for yourself after your procedure. Your health care provider may also give you more specific instructions. Your treatment has been planned according to current medical practices, but problems sometimes occur. Call your health care provider if you have any problems or questions after your procedure. WHAT TO EXPECT AFTER THE PROCEDURE  After your procedure, it is typical to have the following:  A small amount of blood in your stool.  Moderate amounts of gas and mild abdominal cramping or bloating. [place]  TO EXPECT AFTER THE PROCEDURE   After your procedure, it is typical to have the following:   A small amount of blood in your stool.   Moderate amounts of gas and mild abdominal cramping or bloating.  HOME CARE INSTRUCTIONS   Do not drive, operate machinery, or sign important documents for 24 hours.   You may shower and resume your regular physical activities, but move at a slower pace for the first 24 hours.   Take frequent rest periods for the first 24 hours.   Walk around or put a warm  pack on your abdomen to help reduce abdominal cramping and bloating.   Drink enough fluids to keep your urine clear or pale yellow.   You may resume your normal diet as instructed by your health care provider. Avoid heavy or fried foods that are hard to digest.   Avoid drinking alcohol for 24 hours or as instructed by your health care provider.   Only take over-the-counter or prescription medicines as directed by your health care provider.   If a tissue sample (biopsy) was taken during your procedure:   Do not take aspirin or blood thinners for 7 days, or as instructed by your health care provider.   Do not drink alcohol for 7 days, or as instructed by your health care provider.   Eat soft foods for the first 24 hours.  SEEK MEDICAL CARE IF:  You have persistent spotting of blood in your stool 2-3 days after the procedure.  SEEK IMMEDIATE MEDICAL CARE IF:   You have more than a small spotting of blood in your stool.   You pass large blood clots in your stool.   Your abdomen is swollen (distended).   You have nausea or vomiting.   You have a fever.   You have increasing abdominal pain that is not relieved with medicine.  Document Released: 05/09/2004 Document Revised: 07/16/2013 Document Reviewed: 06/02/2013  [employer] Patient Information 2015 [employer]. This information is not intended to replace advice given to you by your health care provider. Make sure you discuss any questions you have with your health care provider.

## 2015-06-03 NOTE — H&P (Signed)
Tanner Phillips is an 59 y.o. male.    Chief Complaint: Colorectal cancer screening.  HPI: 59 year old white male presents to the hospital for a screening colonoscopy. He has severe sleep apnea and is therefore he is being done in a hospital setting. See office notes for details.      Past Medical History  Diagnosis Date  . Chest pain   . Knee pain   . Obesity   . History of Bell's palsy   . SOB (shortness of breath)   . Myocardial infarction     14-15 yrs. ago   Past Surgical History  Procedure Laterality Date  . Back surgery      LOWER  . Cholecystectomy    . Right shoulder surgery    . Left shoulder surgery     History reviewed. No pertinent family history. Social History:  reports that he has never smoked. He has never used smokeless tobacco. He reports that he drinks alcohol. He reports that he does not use illicit drugs.  Allergies: No Known Allergies  Medications Prior to Admission  Medication Sig Dispense Refill  . amoxicillin (AMOXIL) 500 MG capsule Take 2,000 mg by mouth as directed. For dental appointments    . fexofenadine (ALLEGRA) 180 MG tablet Take 180 mg by mouth daily.    Marland Kitchen ibuprofen (ADVIL,MOTRIN) 800 MG tablet Take 800 mg by mouth every 8 (eight) hours as needed for moderate pain.    Marland Kitchen zolpidem (AMBIEN) 10 MG tablet Take 10 mg by mouth at bedtime as needed for sleep.     Review of Systems  HENT: Negative.   Eyes: Negative.   Respiratory: Negative.   Cardiovascular: Negative.   Gastrointestinal: Negative.   Genitourinary: Negative.   Skin: Negative.   Neurological: Positive for weakness.  Endo/Heme/Allergies: Negative.   Psychiatric/Behavioral: Negative.    Blood pressure 148/81, pulse 62, temperature 98.2 F (36.8 C), temperature source Oral, resp. rate 11, height 5\' 5"  (1.651 m), weight 108.863 kg (240 lb), SpO2 95 %. Physical Exam   Assessment/Plan Colorectal cancer screening/family history of colon cancer; proceed with a colonoscopy at this  time.  Dillie Burandt 06/03/2015, 7:18 AM

## 2015-06-03 NOTE — Op Note (Addendum)
[facility] [place], [address]   OPERATIVE PROCEDURE REPORT  PATIENT: Tanner Phillips, Tanner Phillips  MR#: [id number] BIRTHDATE: 26-Aug-1956 GENDER: male ENDOSCOPIST: Edmonia James, MD ASSISTANT:   Cristopher Estimable, technician & Hilma Favors, RN. PROCEDURE DATE: 06/03/2015 PRE-PROCEDURE PREPARATION: Patient fasted for 4 hours prior to procedure. The patient was prepped with 32 oz.  of Suprep the night before the procedure and 32 oz.  the morning of the procedure.  The patient was fasted for 4 hours prior to the procedure. PRE-PROCEDURE PHYSICAL: Patient has stable vital signs. Neck is supple. There is no JVD, thyromegaly or LAD. Chest clear to auscultation.  S1 and S2 regular.  Abdomen soft, non-distended, non-tender with NABS. PROCEDURE:     Colonoscopy with cold biopsy x 1. ASA CLASS:     Class III INDICATIONS:     1.  Colorectal cancer screening-family history of colon cancer-father.Marland Kitchen MEDICATIONS:     Monitored anesthesia care  DESCRIPTION OF PROCEDURE: After the risks, benefits, and alternatives of the procedure were thoroughly explained [including a 10% missed rate of cancer and polyps], informed consent was obtained. Digital rectal exam was performed.  The EC-[id number] ([id number])  was introduced through the anus  and advanced to the terminal ileum which was intubated for a short distanced. The quality of the prep was excellent. Multiple washes were done. Small lesions could be missed. The instrument was then slowly withdrawn as the colon was fully examined. Estimated blood loss is zero unless otherwise noted in this procedure report.     COLON FINDINGS: There were a few small scattered sigmoid dverticulosis noted.  A small single polyp was found in the ceum and was removed by cold biopsy x 1. The rest of the entire colonic mucosa appeared healthy with a normal vascular pattern. No masses AVMs were noted. The appendiceal orifice and the ICV were identified  and photographed.  The terminal ileum appeared normal. Retroflexed views revealed no abnormalities.  The patient tolerated the procedure without immediate complications.  The scope was then withdrawn from the patient and the procedure terminated.  TIME TO CECUM:   4 minutes 00 seconds WITHDRAW TIME:  9 minutes 00 seconds  IMPRESSION:     1.  Few small scattered sigmoid diverticula 2.  One dimunitive cecal polyp-removed by cold biopsy x 1. 3. Otherwise normal exam upto the terminal ileum.  RECOMMENDATIONS:     1.  Hold Aspirin and all other NSAIDS for 2 weeks. 2.  Continue current medications. 3.  Continue surveillance. 4.  Await pathology results. 5.  High fiber diet with liberal fluid intake. 6.  OP follow-up is advised on a PRN basis.  REPEAT EXAM:      In 5 years  for a repeat colonoscopy.  If the patient has any abnormal GI symptoms in the interim, he have been advised to contact the office as soon as possible for further recommendations.   REFERRED JO:ACZYS Little, M.D. eSigned:  Edmonia James, MD 26-Jun-2015 7:27 AM Revised: 2015/06/26 7:27 AM CPT CODES:     06301 Colonoscopy, flexible, proximal to splenic flexure; with biopsy, single or multiple ICD CODES:     Z12.11 Encounter for screening for malignant neoplasm of colon Z80.0 Family history of malignant neoplasm of digestive organs, K63.5 Colon polyp k57.30 Diverticulosis  The ICD and CPT codes recommended by this software are interpretations from the data that the clinical staff has captured with the software.  The verification of the translation of this report  to the ICD and CPT codes and modifiers is the sole responsibility of the health care institution and practicing physician where this report was generated.  [employer]. will not be held responsible for the validity of the ICD and CPT codes included on this report.  AMA assumes no liability for data contained or not contained herein. CPT is a  Designer, television/film set of the [employer].  PATIENT NAME:  Tanner Phillips, Tanner Phillips MR#: [id number]

## 2015-06-04 ENCOUNTER — Encounter (HOSPITAL_COMMUNITY): Payer: Self-pay | Admitting: Gastroenterology

## 2015-08-26 ENCOUNTER — Ambulatory Visit
Admission: RE | Admit: 2015-08-26 | Discharge: 2015-08-26 | Disposition: A | Discharge status: Home / Self Care | Payer: BLUE CROSS/BLUE SHIELD | Source: Ambulatory Visit | Attending: Family Medicine | Admitting: Family Medicine

## 2015-08-26 ENCOUNTER — Other Ambulatory Visit: Payer: Self-pay | Admitting: Family Medicine

## 2015-08-26 DIAGNOSIS — Z139 Encounter for screening, unspecified: Secondary | ICD-10-CM

## 2017-05-20 ENCOUNTER — Emergency Department (HOSPITAL_COMMUNITY)
Admission: EM | Admit: 2017-05-20 | Discharge: 2017-05-21 | Disposition: A | Discharge status: Home / Self Care | Payer: Self-pay | Attending: Emergency Medicine | Admitting: Emergency Medicine

## 2017-05-20 ENCOUNTER — Encounter (HOSPITAL_COMMUNITY): Payer: Self-pay | Admitting: Nurse Practitioner

## 2017-05-20 DIAGNOSIS — N200 Calculus of kidney: Secondary | ICD-10-CM | POA: Insufficient documentation

## 2017-05-20 DIAGNOSIS — Z79899 Other long term (current) drug therapy: Secondary | ICD-10-CM | POA: Insufficient documentation

## 2017-05-20 NOTE — ED Triage Notes (Signed)
Pt is c/o LLQ abdominal pain. Remarks on hx of diverticulosis suspecting a flare up.

## 2017-05-21 ENCOUNTER — Emergency Department (HOSPITAL_COMMUNITY): Payer: Self-pay

## 2017-05-21 ENCOUNTER — Encounter (HOSPITAL_COMMUNITY): Payer: Self-pay | Admitting: Radiology

## 2017-05-21 LAB — COMPREHENSIVE METABOLIC PANEL
ALT: 32 U/L (ref 17–63)
AST: 23 U/L (ref 15–41)
Albumin: 4.1 g/dL (ref 3.5–5.0)
Alkaline Phosphatase: 46 U/L (ref 38–126)
Anion gap: 10 (ref 5–15)
BUN: 17 mg/dL (ref 6–20)
CHLORIDE: 107 mmol/L (ref 101–111)
CO2: 22 mmol/L (ref 22–32)
CREATININE: 1.58 mg/dL — AB (ref 0.61–1.24)
Calcium: 9 mg/dL (ref 8.9–10.3)
GFR calc non Af Amer: 45 mL/min — ABNORMAL LOW (ref >60)
GFR, EST AFRICAN AMERICAN: 52 mL/min — AB (ref >60)
Glucose, Bld: 137 mg/dL — ABNORMAL HIGH (ref 65–99)
POTASSIUM: 4.1 mmol/L (ref 3.5–5.1)
SODIUM: 139 mmol/L (ref 135–145)
Total Bilirubin: 0.9 mg/dL (ref 0.3–1.2)
Total Protein: 7 g/dL (ref 6.5–8.1)

## 2017-05-21 LAB — URINALYSIS, ROUTINE W REFLEX MICROSCOPIC
Bilirubin Urine: NEGATIVE
GLUCOSE, UA: NEGATIVE mg/dL
HGB URINE DIPSTICK: NEGATIVE
Ketones, ur: 20 mg/dL — AB
LEUKOCYTES UA: NEGATIVE
Nitrite: NEGATIVE
PH: 5 (ref 5.0–8.0)
Protein, ur: NEGATIVE mg/dL
SPECIFIC GRAVITY, URINE: 1.019 (ref 1.005–1.030)

## 2017-05-21 LAB — CBC
HEMATOCRIT: 45.1 % (ref 39.0–52.0)
Hemoglobin: 15.7 g/dL (ref 13.0–17.0)
MCH: 29.5 pg (ref 26.0–34.0)
MCHC: 34.8 g/dL (ref 30.0–36.0)
MCV: 84.6 fL (ref 78.0–100.0)
PLATELETS: 251 10*3/uL (ref 150–400)
RBC: 5.33 MIL/uL (ref 4.22–5.81)
RDW: 13.7 % (ref 11.5–15.5)
WBC: 14.7 10*3/uL — ABNORMAL HIGH (ref 4.0–10.5)

## 2017-05-21 LAB — LIPASE, BLOOD: LIPASE: 32 U/L (ref 11–51)

## 2017-05-21 MED ORDER — HYDROMORPHONE HCL 1 MG/ML IJ SOLN
1.0000 mg | Freq: Once | INTRAMUSCULAR | Status: AC
  Administered 2017-05-21: 1 mg via INTRAVENOUS
  Filled 2017-05-21: qty 1

## 2017-05-21 MED ORDER — ONDANSETRON 4 MG PO TBDP: 4.0000 mg | ORAL_TABLET | Freq: Three times a day (TID) | ORAL | 0 refills | Status: DC | PRN

## 2017-05-21 MED ORDER — SODIUM CHLORIDE 0.9 % IV BOLUS (SEPSIS)
1000.0000 mL | Freq: Once | INTRAVENOUS | Status: AC
  Administered 2017-05-21: 1000 mL via INTRAVENOUS

## 2017-05-21 MED ORDER — OXYCODONE-ACETAMINOPHEN 5-325 MG PO TABS: 1.0000 | ORAL_TABLET | Freq: Four times a day (QID) | ORAL | 0 refills | Status: DC | PRN

## 2017-05-21 MED ORDER — IOPAMIDOL (ISOVUE-300) INJECTION 61%
100.0000 mL | Freq: Once | INTRAVENOUS | Status: AC | PRN
  Administered 2017-05-21: 100 mL via INTRAVENOUS

## 2017-05-21 MED ORDER — HYDROMORPHONE HCL 1 MG/ML IJ SOLN
1.0000 mg | Freq: Once | INTRAMUSCULAR | Status: AC
Start: 2017-05-21 — End: 2017-05-21
  Administered 2017-05-21: 1 mg via INTRAVENOUS
  Filled 2017-05-21: qty 1

## 2017-05-21 MED ORDER — ONDANSETRON HCL 4 MG/2ML IJ SOLN
4.0000 mg | Freq: Once | INTRAMUSCULAR | Status: AC
  Administered 2017-05-21: 4 mg via INTRAVENOUS
  Filled 2017-05-21: qty 2

## 2017-05-21 MED ORDER — SODIUM CHLORIDE 0.9 % IV SOLN
Freq: Once | INTRAVENOUS | Status: AC
  Administered 2017-05-21: via INTRAVENOUS

## 2017-05-21 MED ORDER — IOPAMIDOL (ISOVUE-300) INJECTION 61%
INTRAVENOUS | Status: AC
  Filled 2017-05-21: qty 100

## 2017-05-21 NOTE — ED Provider Notes (Signed)
[facility] DEPT Provider Note   CSN: [id number] Arrival date & time: 05/20/17  2255     History   Chief Complaint Chief Complaint  Patient presents with  . Abdominal Pain    HPI Tanner Phillips is a 61 y.o. male.  This is 61 year old male with known diverticular disease.  He was been well until his afternoon when he developed vomiting and diarrhea and excruciating left lower quadrant abdominal pain.  Denies any dysuria, constipation      Past Medical History:  Diagnosis Date  . Chest pain   . History of Bell's palsy   . Knee pain   . Myocardial infarction ([place])    14-15 yrs. ago  . Obesity   . SOB (shortness of breath)     Patient Active Problem List   Diagnosis Date Noted  . 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 PULMONARY NODULE, LEFT LOWER LOBE 07/21/2010    Past Surgical History:  Procedure Laterality Date  . BACK SURGERY     LOWER  . CHOLECYSTECTOMY    . COLONOSCOPY WITH PROPOFOL N/A 06/03/2015   Procedure: COLONOSCOPY WITH PROPOFOL;  Surgeon: Juanita Craver, MD;  Location: [facility] ENDOSCOPY;  Service: Endoscopy;  Laterality: N/A;  . Left Shoulder Surgery    . Right Shoulder Surgery         Home Medications    Prior to Admission medications   Medication Sig Start Date End Date Taking? Authorizing Provider  amoxicillin (AMOXIL) 500 MG capsule Take 2,000 mg by mouth as directed. For dental appointments    [provider]  fexofenadine (ALLEGRA) 180 MG tablet Take 180 mg by mouth daily.    [provider]  ondansetron (ZOFRAN ODT) 4 MG disintegrating tablet Take 1 tablet (4 mg total) by mouth every 8 (eight) hours as needed for nausea or vomiting. 05/21/17   Junius Creamer, NP  oxyCODONE-acetaminophen (PERCOCET/ROXICET) 5-325 MG tablet Take 1-2 tablets by mouth every 6 (six) hours as needed for severe pain. 05/21/17   Junius Creamer, NP  zolpidem (AMBIEN) 10 MG tablet Take 10 mg by mouth at bedtime as needed for sleep.    [provider]     Family History History reviewed. No pertinent family history.  Social History Social History  Substance Use Topics  . Smoking status: Never Smoker  . Smokeless tobacco: Never Used  . Alcohol use Yes     Comment: social     Allergies   Patient has no known allergies.   Review of Systems Review of Systems  Constitutional: Negative for fever.  Respiratory: Negative for shortness of breath.   Cardiovascular: Negative for chest pain.  Gastrointestinal: Positive for abdominal pain, diarrhea, nausea and vomiting. Negative for abdominal distention.  Genitourinary: Negative for dysuria.  All other systems reviewed and are negative.    Physical Exam Updated Vital Signs BP (!) 106/59   Pulse 90   Temp 98 F (36.7 C) (Oral)   Resp 14   Ht 5\' 5"  (1.651 m)   Wt 115.7 kg (255 lb)   SpO2 94%   BMI 42.43 kg/m   Physical Exam  Constitutional: He appears well-developed and well-nourished.  HENT:  Head: Normocephalic.  Eyes: Pupils are equal, round, and reactive to light.  Cardiovascular: Normal rate.   Pulmonary/Chest: Effort normal.  Abdominal: He exhibits no distension. There is tenderness.  Musculoskeletal: Normal range of motion.  Skin: Skin is warm.  Psychiatric: He has a normal mood and affect.  Nursing note and vitals reviewed.  ED Treatments / Results  Labs (all labs ordered are listed, but only abnormal results are displayed) Labs Reviewed  COMPREHENSIVE METABOLIC PANEL - Abnormal; Notable for the following:       Result Value   Glucose, Bld 137 (*)    Creatinine, Ser 1.58 (*)    GFR calc non Af Amer 45 (*)    GFR calc Af Amer 52 (*)    All other components within normal limits  CBC - Abnormal; Notable for the following:    WBC 14.7 (*)    All other components within normal limits  URINALYSIS, ROUTINE W REFLEX MICROSCOPIC - Abnormal; Notable for the following:    Ketones, ur 20 (*)    All other components within normal limits  LIPASE, BLOOD     EKG  EKG Interpretation None       Radiology Ct Abdomen Pelvis W Contrast  Result Date: 05/21/2017 CLINICAL DATA:  Left lower quadrant pain EXAM: CT ABDOMEN AND PELVIS WITH CONTRAST TECHNIQUE: Multidetector CT imaging of the abdomen and pelvis was performed using the standard protocol following bolus administration of intravenous contrast. CONTRAST:  176mL ISOVUE-300 IOPAMIDOL (ISOVUE-300) INJECTION 61% COMPARISON:  None. FINDINGS: Lower chest: Lung bases demonstrate no acute consolidation or pleural effusion. 6 x 5 mm, average diameter 6 mm pulmonary nodule in the lingula, series 6, image number 14. Nonenlarged heart. 3.7 cm fluid density mass in the right pericardial space. Hepatobiliary: Hepatic steatosis. Surgical clips in the gallbladder fossa. No biliary dilatation Pancreas: Unremarkable. No pancreatic ductal dilatation or surrounding inflammatory changes. Spleen: Normal in size without focal abnormality. Adrenals/Urinary Tract: Adrenal glands are within normal limits. Left perinephric fat stranding. Mild to moderate left hydronephrosis and hydroureter, secondary to a 3 mm stone in the distal left ureter, just above the left UVJ. Bladder otherwise normal Stomach/Bowel: Stomach is within normal limits. Appendix appears normal. No evidence of bowel wall thickening, distention, or inflammatory changes. Vascular/Lymphatic: Aortic atherosclerosis. No enlarged abdominal or pelvic lymph nodes. Reproductive: Prostate calcification Other: No free air or free fluid Musculoskeletal: No acute or significant osseous findings. Degenerative changes at L5-S1 with minimal retrolisthesis of L5 on S1. IMPRESSION: 1. Mild to moderate left hydronephrosis and hydroureter, secondary to a 3 mm stone in the distal left ureter, just above the left UVJ 2. 6 mm pulmonary nodule in the lingula. Non-contrast chest CT at 6-12 months is recommended. If the nodule is stable at time of repeat CT, then future CT at 18-24 months  (from today's scan) is considered optional for low-risk patients, but is recommended for high-risk patients. This recommendation follows the consensus statement: Guidelines for Management of Incidental Pulmonary Nodules Detected on CT Images: From the Fleischner Society 2017; Radiology 2017; 284:228-243. 3. 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   On: 05/21/2017 01:56    Procedures Procedures (including critical care time)  Medications Ordered in ED Medications  iopamidol (ISOVUE-300) 61 % injection (not administered)  sodium chloride 0.9 % bolus 1,000 mL (0 mLs Intravenous Stopped 05/21/17 0146)  0.9 %  sodium chloride infusion ( Intravenous New Bag/Given 05/21/17 0028)  ondansetron (ZOFRAN) injection 4 mg (4 mg Intravenous Given 05/21/17 0027)  HYDROmorphone (DILAUDID) injection 1 mg (1 mg Intravenous Given 05/21/17 0027)  iopamidol (ISOVUE-300) 61 % injection 100 mL (100 mLs Intravenous Contrast Given 05/21/17 0128)  HYDROmorphone (DILAUDID) injection 1 mg (1 mg Intravenous Given 05/21/17 0223)  Initial Impression / Assessment and Plan / ED Course  I have reviewed the triage vital signs and the nursing notes.  Pertinent labs & imaging results that were available during my care of the patient were reviewed by me and considered in my medical decision making (see chart for details).        Final Clinical Impressions(s) / ED Diagnoses   Final diagnoses:  Kidney stone    New Prescriptions New Prescriptions   ONDANSETRON (ZOFRAN ODT) 4 MG DISINTEGRATING TABLET    Take 1 tablet (4 mg total) by mouth every 8 (eight) hours as needed for nausea or vomiting.   OXYCODONE-ACETAMINOPHEN (PERCOCET/ROXICET) 5-325 MG TABLET    Take 1-2 tablets by mouth every 6 (six) hours as needed for severe pain.     Junius Creamer, NP 05/21/17 [place], MD 05/22/17 1006

## 2017-05-21 NOTE — Discharge Instructions (Signed)
Your CT scan shows that you have a 3 mm kidney stone on what should pass without too much difficulty.  He been given a referral to [facility].  Urology.  Please call and make an appointment for follow-up You have been given a prescription for Percocet for pain control and Zofran for control of nausea.  Take these as needed.  If you've pain is uncontrollable at home.  Please return for further evaluation and treatment in the emergency department

## 2018-04-12 IMAGING — CT CT ABD-PELV W/ CM
2 of 6 series · 16 of 46 positions shown, 18 images · IV contrast (ISOVUE)
Comparison: None.

CLINICAL DATA: Left lower quadrant pain

EXAM:
CT ABDOMEN AND PELVIS WITH CONTRAST
TECHNIQUE: Multidetector CT imaging of the abdomen and pelvis was performed
using the standard protocol following bolus administration of
intravenous contrast.
CONTRAST:  100mL CNJUTQ-MAA IOPAMIDOL (CNJUTQ-MAA) INJECTION 61%

[Series 2: abd/pel with · axial · 0.94mm/px · z∈[+1128,+1548]mm · 13 of 98 slices shown, 15 images]
[im 7/98  soft-tissue]
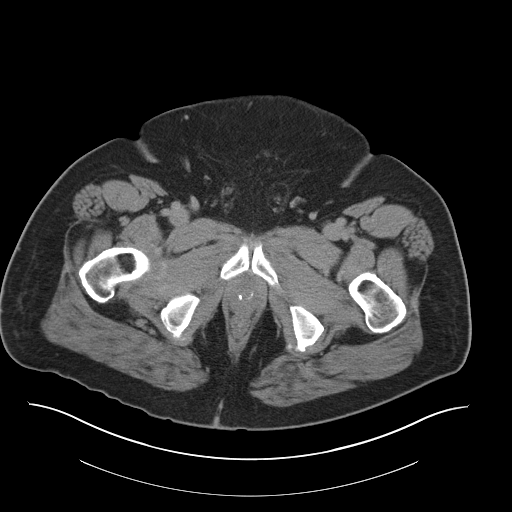
[im 7/98  bone]
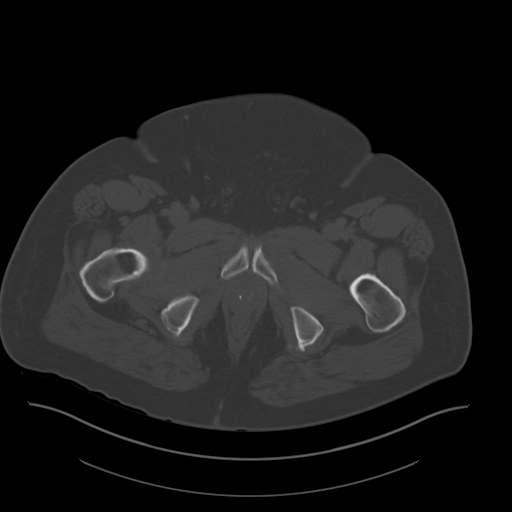
[im 14/98  soft-tissue]
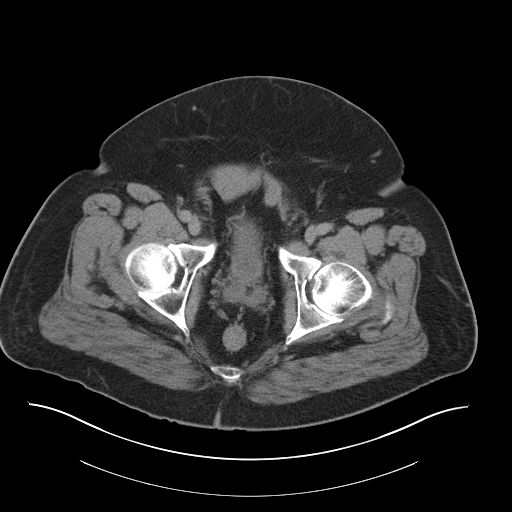
[im 21/98  soft-tissue]
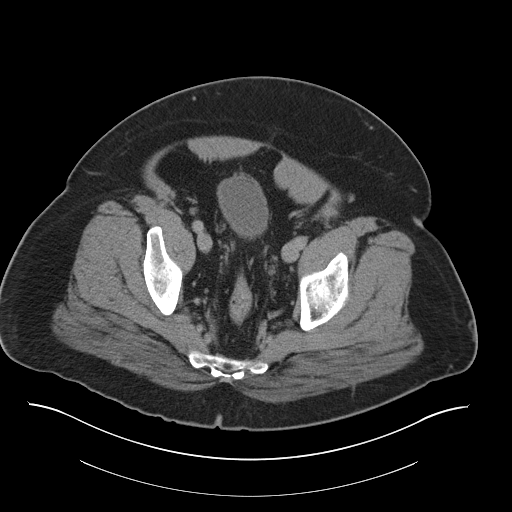
[im 28/98  soft-tissue]
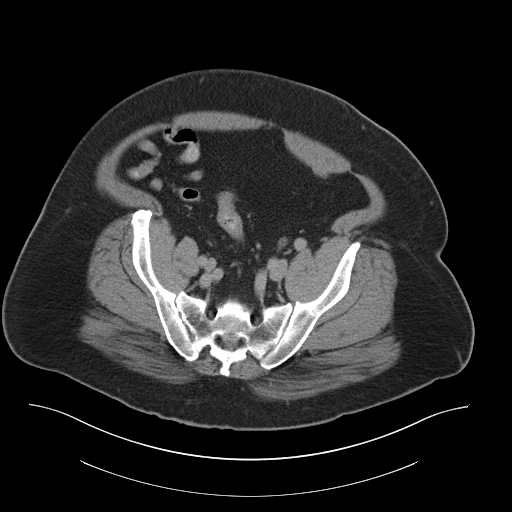
[im 35/98  soft-tissue]
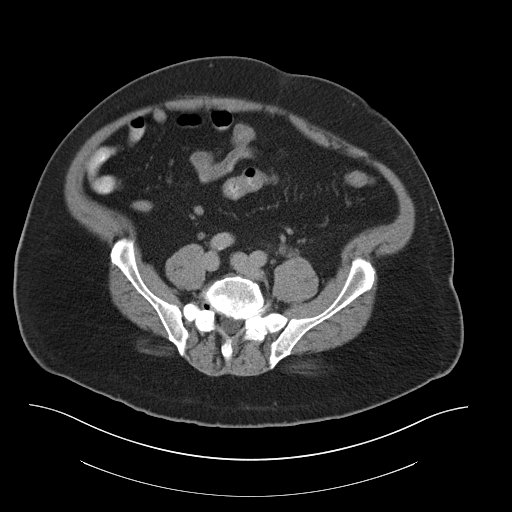
[im 42/98  soft-tissue]
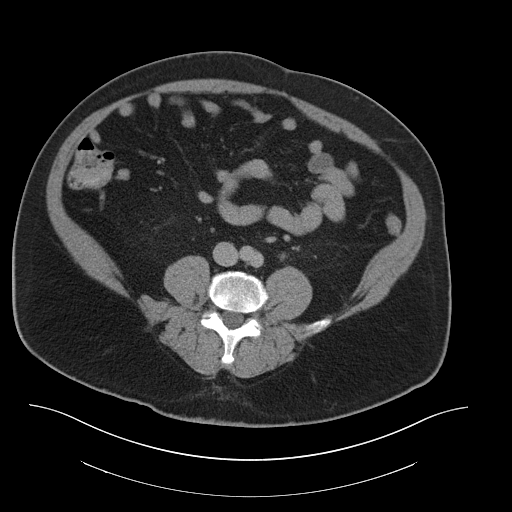
[im 49/98  soft-tissue]
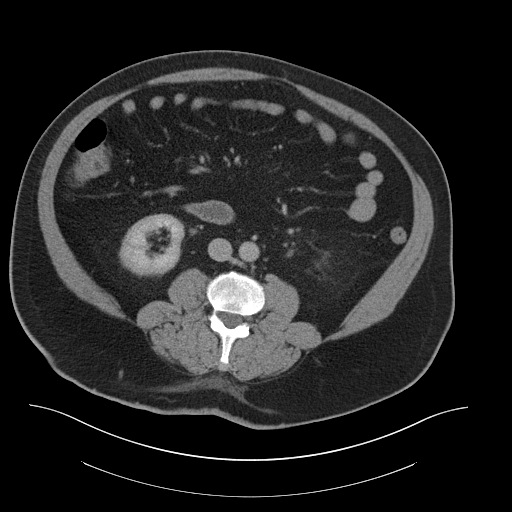
[im 56/98  soft-tissue]
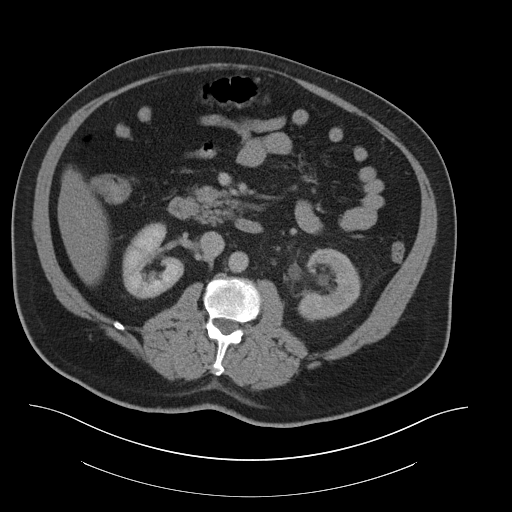
[im 63/98  soft-tissue]
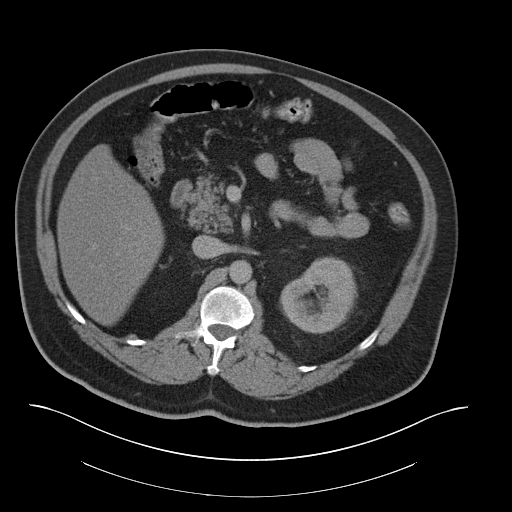
[im 63/98  bone]
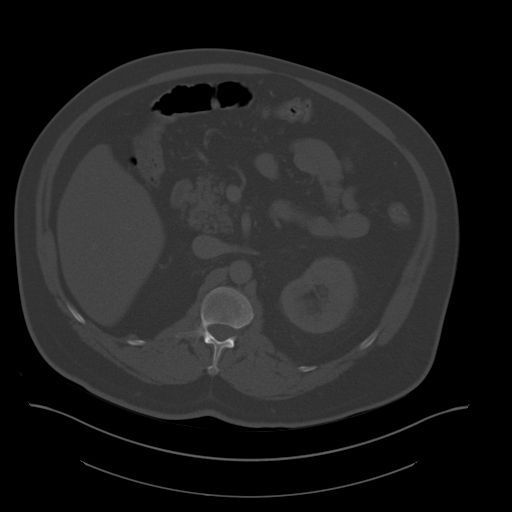
[im 70/98  soft-tissue]
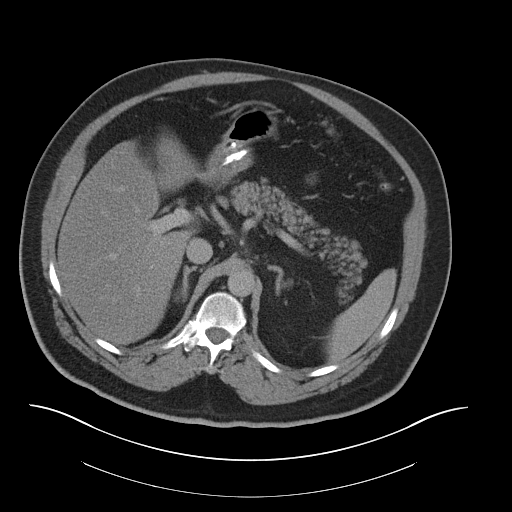
[im 77/98  soft-tissue]
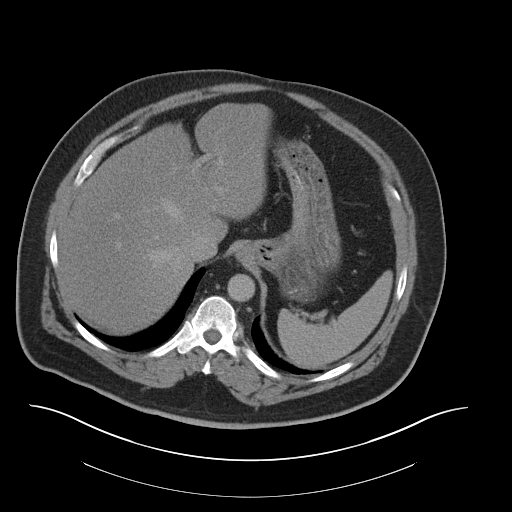
[im 84/98  soft-tissue]
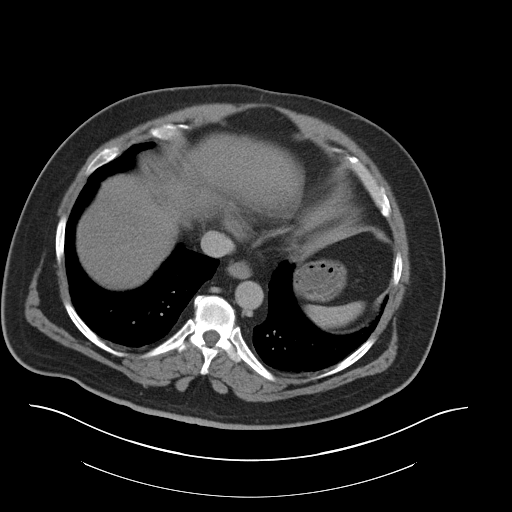
[im 91/98  soft-tissue]
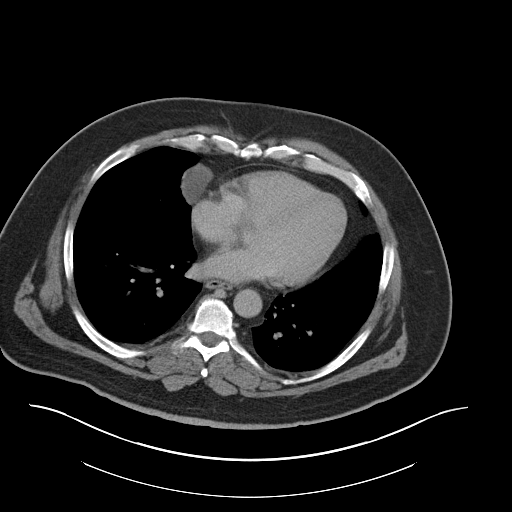

[Series 3: coronal a/|p · coronal · 0.86mm/px · 3 of 191 slices shown]
[im 64/191  soft-tissue]
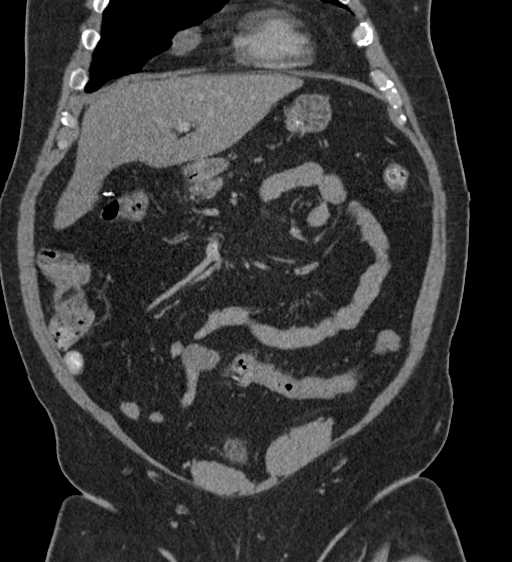
[im 85/191  soft-tissue]
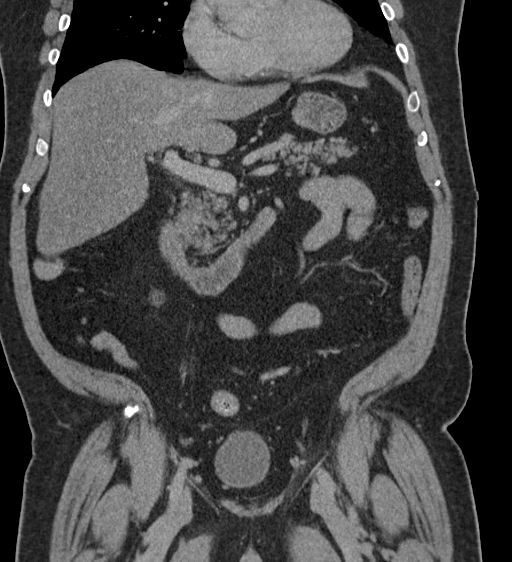
[im 106/191  soft-tissue]
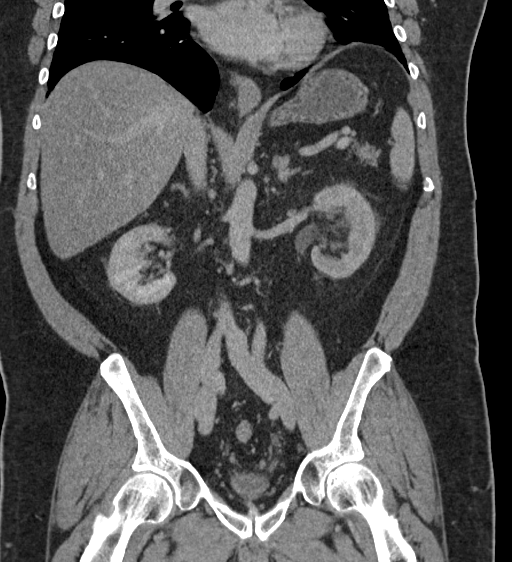

[16 of 46 positions shown; findings below may reference images not displayed]

FINDINGS: Lower chest: Lung bases demonstrate no acute consolidation or
pleural effusion. 6 x 5 mm, average diameter 6 mm pulmonary nodule
in the lingula, series 6, image number 14. Nonenlarged heart. 3.7 cm
fluid density mass in the right pericardial space.

Hepatobiliary: Hepatic steatosis. Surgical clips in the gallbladder
fossa. No biliary dilatation

Pancreas: Unremarkable. No pancreatic ductal dilatation or
surrounding inflammatory changes.

Spleen: Normal in size without focal abnormality.

Adrenals/Urinary Tract: Adrenal glands are within normal limits.
Left perinephric fat stranding. Mild to moderate left hydronephrosis
and hydroureter, secondary to a 3 mm stone in the distal left
ureter, just above the left UVJ. Bladder otherwise normal

Stomach/Bowel: Stomach is within normal limits. Appendix appears
normal. No evidence of bowel wall thickening, distention, or
inflammatory changes.

Vascular/Lymphatic: Aortic atherosclerosis. No enlarged abdominal or
pelvic lymph nodes.

Reproductive: Prostate calcification

Other: No free air or free fluid

Musculoskeletal: No acute or significant osseous findings.
Degenerative changes at L5-S1 with minimal retrolisthesis of L5 on
S1.
IMPRESSION: 1. Mild to moderate left hydronephrosis and hydroureter, secondary
to a 3 mm stone in the distal left ureter, just above the left UVJ
2. 6 mm pulmonary nodule in the lingula. Non-contrast chest CT at
6-12 months is recommended. If the nodule is stable at time of
repeat CT, then future CT at 18-24 months (from today's scan) is
considered optional for low-risk patients, but is recommended for
high-risk patients. This recommendation follows the consensus
statement: Guidelines for Management of Incidental Pulmonary Nodules
Detected on CT Images: From the [HOSPITAL] 9949; Radiology
9949; [DATE].
3. 3.7 cm fluid density mass in the right pericardial space, likely
representing a pericardial cyst

## 2018-11-26 ENCOUNTER — Other Ambulatory Visit: Payer: Self-pay | Admitting: Internal Medicine

## 2018-11-26 DIAGNOSIS — R109 Unspecified abdominal pain: Secondary | ICD-10-CM

## 2018-11-26 DIAGNOSIS — Z87442 Personal history of urinary calculi: Secondary | ICD-10-CM

## 2018-12-02 ENCOUNTER — Ambulatory Visit
Admission: RE | Admit: 2018-12-02 | Discharge: 2018-12-02 | Disposition: A | Discharge status: Home / Self Care | Payer: Medicare Other | Source: Ambulatory Visit | Attending: Internal Medicine | Admitting: Internal Medicine

## 2018-12-02 DIAGNOSIS — Z87442 Personal history of urinary calculi: Secondary | ICD-10-CM

## 2018-12-02 DIAGNOSIS — R109 Unspecified abdominal pain: Secondary | ICD-10-CM

## 2019-05-07 ENCOUNTER — Other Ambulatory Visit: Payer: Self-pay | Admitting: Sports Medicine

## 2019-05-07 ENCOUNTER — Ambulatory Visit: Payer: Medicare Other | Admitting: Sports Medicine

## 2019-05-07 ENCOUNTER — Encounter: Payer: Self-pay | Admitting: Sports Medicine

## 2019-05-07 ENCOUNTER — Other Ambulatory Visit: Payer: Self-pay

## 2019-05-07 ENCOUNTER — Ambulatory Visit (INDEPENDENT_AMBULATORY_CARE_PROVIDER_SITE_OTHER): Payer: Medicare Other

## 2019-05-07 VITALS — Temp 98.6°F | Resp 16

## 2019-05-07 DIAGNOSIS — M722 Plantar fascial fibromatosis: Secondary | ICD-10-CM

## 2019-05-07 DIAGNOSIS — M79672 Pain in left foot: Secondary | ICD-10-CM

## 2019-05-07 MED ORDER — TRIAMCINOLONE ACETONIDE 40 MG/ML IJ SUSP
20.0000 mg | Freq: Once | INTRAMUSCULAR | Status: AC
  Administered 2019-05-07: 20 mg

## 2019-05-07 NOTE — Progress Notes (Signed)
Subjective: Tanner Phillips is a 63 y.o. male patient presents to office with complaint of moderate heel pain on the left. Patient admits to post static dyskinesia for 3 months in duration, sharp 10/10 worse with standing or walking 1st few steps in AM. Patient has treated this problem with epsom saltwith no relief. Denies any other pedal complaints.   Review of Systems  Musculoskeletal: Positive for joint pain and myalgias.  All other systems reviewed and are negative.    Patient Active Problem List   Diagnosis Date Noted  . 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 amoxicillin (AMOXIL) 500 MG capsule Take 2,000 mg by mouth as directed. For dental appointments    . fexofenadine (ALLEGRA) 180 MG tablet Take 180 mg by mouth daily.    . ondansetron (ZOFRAN ODT) 4 MG disintegrating tablet Take 1 tablet (4 mg total) by mouth every 8 (eight) hours as needed for nausea or vomiting. 20 tablet 0  . oxyCODONE-acetaminophen (PERCOCET/ROXICET) 5-325 MG tablet Take 1-2 tablets by mouth every 6 (six) hours as needed for severe pain. 20 tablet 0  . zolpidem (AMBIEN) 10 MG tablet Take 10 mg by mouth at bedtime as needed for sleep.     No current facility-administered medications on file prior to visit.     No Known Allergies  Objective: Physical Exam General: The patient is alert and oriented x3 in no acute distress.  Dermatology: Skin is warm, dry and supple bilateral lower extremities. Nails 1-10 are normal. There is no erythema, edema, no eccymosis, no open lesions present. Integument is otherwise unremarkable.  Vascular: Dorsalis Pedis pulse and Posterior Tibial pulse are 2/4 bilateral. Capillary fill time is immediate to all digits.  Neurological: Grossly intact to light touch with an achilles reflex of +2/5 and a  negative Tinel's sign  bilateral.  Musculoskeletal: Tenderness to palpation at the medial calcaneal tubercale and through the insertion of the plantar fascia on the left. No pain with calf compression bilateral. There is decreased Ankle joint range of motion bilateral. All other joints range of motion within normal limits bilateral. Strength 5/5 in all groups bilateral.   Gait: Unassisted, Antalgic avoid weight on left heel  Xray, Left foot:  Normal osseous mineralization. Joint spaces preserved. No fracture/dislocation/boney destruction. Calcaneal spur present with mild thickening of plantar fascia. No other soft tissue abnormalities or radiopaque foreign bodies.   Assessment and Plan: Problem List Items Addressed This Visit    None    Visit Diagnoses    Plantar fasciitis of left foot    -  Primary   Pain of left heel          -Complete examination performed.  -Xrays reviewed -Discussed with patient in detail the condition of plantar fasciitis, how this occurs and general treatment options. Explained both conservative and surgical treatments.  -After oral consent and aseptic prep, injected a mixture containing 1 ml of 2%  plain lidocaine, 1 ml 0.5% plain marcaine, 0.5 ml of kenalog 10 and 0.5 ml of dexamethasone phosphate into left heel. Post-injection care discussed with patient.  - Explained in detail the use of the fascial brace for left which was dispensed at today's visit. -Explained and dispensed to patient daily stretching exercises. -Recommend patient to ice affected area 1-2x daily. -Patient to return to office in 3-4 weeks for follow up or sooner if problems or  questions arise.  Landis Martins, DPM

## 2019-05-09 ENCOUNTER — Other Ambulatory Visit: Payer: Self-pay | Admitting: Sports Medicine

## 2019-05-09 DIAGNOSIS — M722 Plantar fascial fibromatosis: Secondary | ICD-10-CM

## 2019-05-09 DIAGNOSIS — M79672 Pain in left foot: Secondary | ICD-10-CM

## 2019-05-28 ENCOUNTER — Encounter: Payer: Self-pay | Admitting: Sports Medicine

## 2019-05-28 ENCOUNTER — Other Ambulatory Visit: Payer: Self-pay

## 2019-05-28 ENCOUNTER — Ambulatory Visit: Payer: Medicare Other | Admitting: Sports Medicine

## 2019-05-28 VITALS — Temp 97.8°F | Resp 16

## 2019-05-28 DIAGNOSIS — M722 Plantar fascial fibromatosis: Secondary | ICD-10-CM

## 2019-05-28 DIAGNOSIS — M79672 Pain in left foot: Secondary | ICD-10-CM

## 2019-05-28 MED ORDER — MELOXICAM 15 MG PO TABS: 15.0000 mg | ORAL_TABLET | Freq: Every day | ORAL | 0 refills | Status: DC

## 2019-05-28 MED ORDER — TRIAMCINOLONE ACETONIDE 40 MG/ML IJ SUSP
20.0000 mg | Freq: Once | INTRAMUSCULAR | Status: AC
Start: 2019-05-28 — End: 2019-05-28
  Administered 2019-05-28: 20 mg

## 2019-05-28 NOTE — Progress Notes (Signed)
Subjective: Tanner Phillips is a 63 y.o. male returns to office for follow up evaluation after Left heel injection for plantar fasciitis, injection #1 administered 3 weeks ago. Patient states that the injection seems to help his pain for a little but still has pretty bad pain some days with tingling; pain is now 7/10. Reports that its hard to walk on sometimes with tingling. Patient denies any recent changes in medications or new problems since last visit.   Patient Active Problem List   Diagnosis Date Noted  . 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 fexofenadine (ALLEGRA) 180 MG tablet Take 180 mg by mouth daily.    Marland Kitchen zolpidem (AMBIEN) 10 MG tablet Take 10 mg by mouth at bedtime as needed for sleep.     No current facility-administered medications on file prior to visit.     No Known Allergies  Objective:   General:  Alert and oriented x 3, in no acute distress  Dermatology: Skin is warm, dry, and supple bilateral. Nails are within normal limits. There is no lower extremity erythema, no eccymosis, no open lesions present bilateral.   Vascular: Dorsalis Pedis and Posterior Tibial pedal pulses are 2/4 bilateral. + hair growth noted bilateral. Capillary Fill Time is 3 seconds in all digits. No varicosities, No edema bilateral lower extremities.   Neurological: Sensation grossly intact to light touch with an achilles reflex of +2 and a  negative Tinel's sign bilateral. Vibratory, sharp/dull, Semmes Weinstein Monofilament within normal limits.   Musculoskeletal: There is decreased tenderness to palpation at the medial calcaneal tubercale and through the insertion of the plantar fascia on the Left foot. No pain with compression to calcaneus or application of tuning fork. There is decreased Ankle joint range of motion bilateral. All other jointsrange of motion  within normal limits bilateral. Strength 5/5 bilateral.   Assessment and Plan: Problem List Items Addressed This Visit    None    Visit Diagnoses    Plantar fasciitis of left foot    -  Primary   Pain of left heel          -Complete examination performed.  -Previous x-rays reviewed. -Discussed with patient in detail the condition of plantar fasciitis, how this  occurs related to the foot type of the patient and general treatment options. - Patient opted for another injection today; After oral consent and aseptic prep, injected a mixture containing 1 ml of 1%plain lidocaine, 1 ml 0.5% plain marcaine, 0.5 ml of kenalog 40 and 0.5 ml of dexmethasone phosphate to left heel at area of most pain/trigger point injection. This is inj #2 to area.  -Rx Meloxicam  -Dispensed night splint -Continue with plantar fascial brace, stretching, icing, good supportive shoes daily. -Discussed long term care and reocurrence; will closely monitor; if fails to improve will consider other treatment modalities.  -Patient to return to office in 1 month for follow up or sooner if problems or questions arise.  Landis Martins, DPM

## 2019-06-25 ENCOUNTER — Ambulatory Visit: Payer: Medicare Other | Admitting: Sports Medicine

## 2019-10-24 IMAGING — CT CT ABD-PELV W/O
1 of 2 series · 14 of 32 positions shown, 19 images · non-contrast
Comparison: 05/21/2017

CLINICAL DATA: Right lower quadrant pain

EXAM:
CT ABDOMEN AND PELVIS WITHOUT CONTRAST
TECHNIQUE: Multidetector CT imaging of the abdomen and pelvis was performed
following the standard protocol without IV contrast.

[Series 2: abd/pelvis w/(date) · axial · 0.98mm/px · z∈[-448,+2]mm · 14 of 102 slices shown, 19 images]
[im 6/102  soft-tissue]
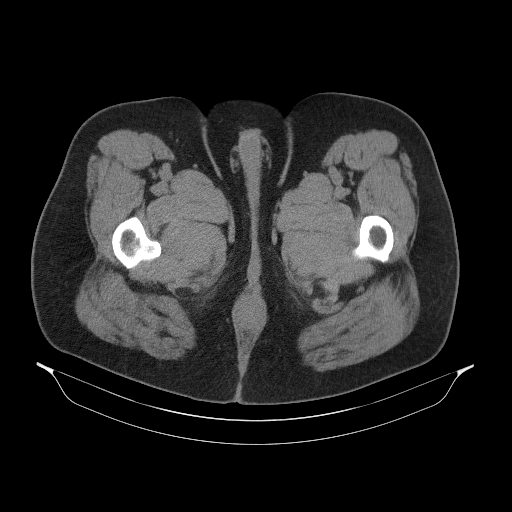
[im 6/102  bone]
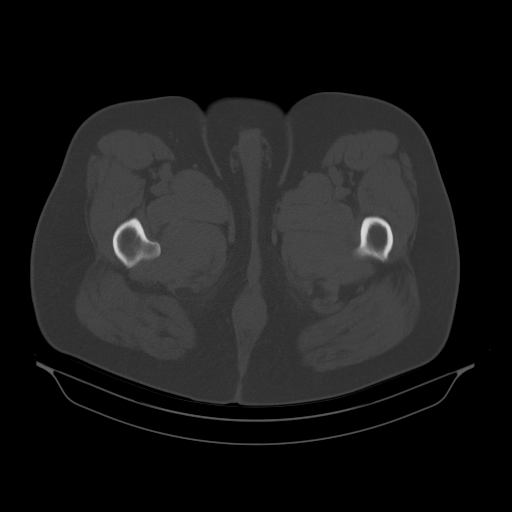
[im 12/102  soft-tissue]
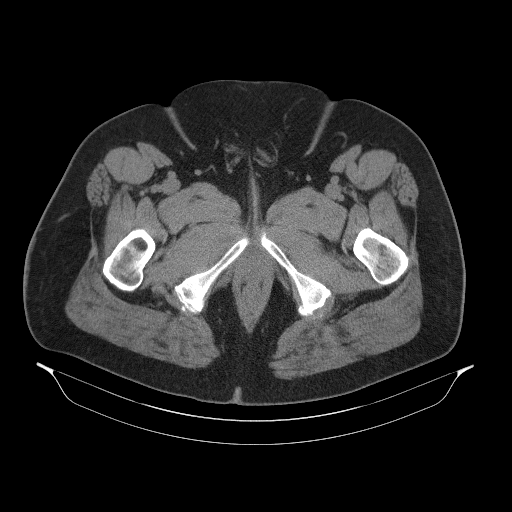
[im 23/102  soft-tissue]
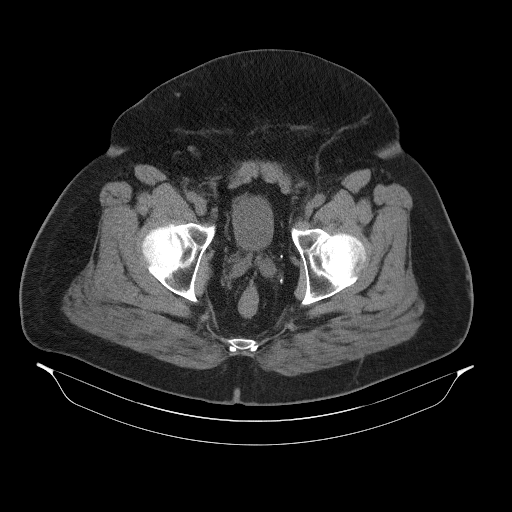
[im 29/102  soft-tissue]
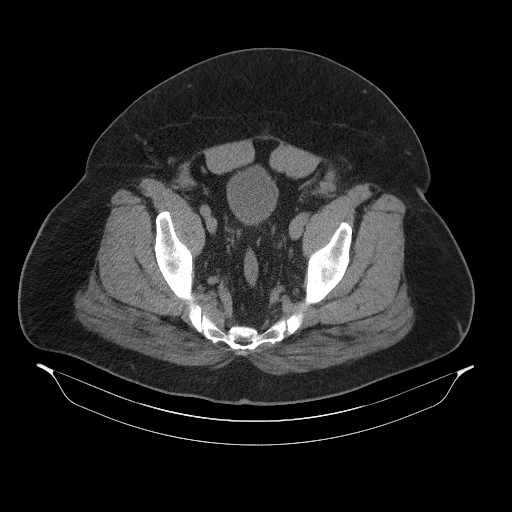
[im 34/102  soft-tissue]
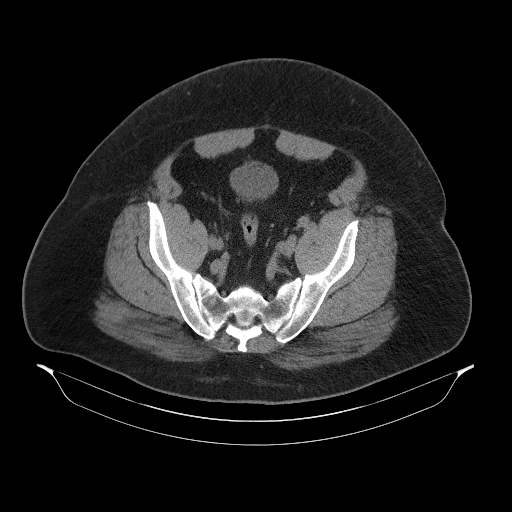
[im 45/102  soft-tissue]
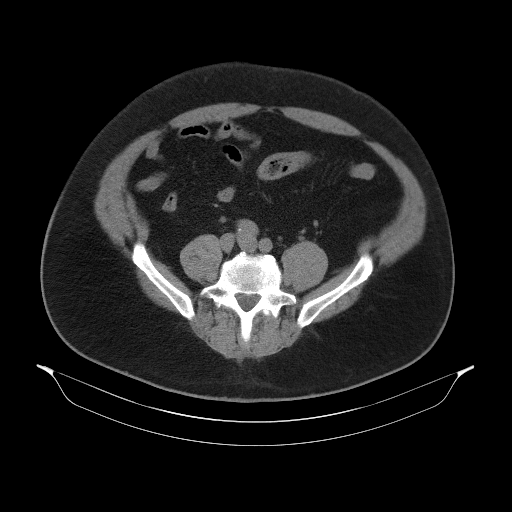
[im 51/102  soft-tissue]
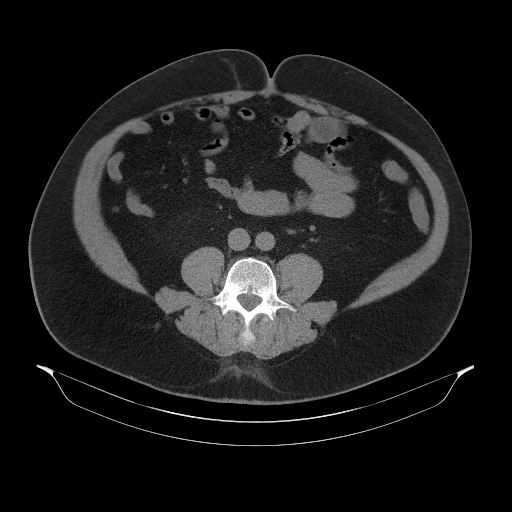
[im 57/102  soft-tissue]
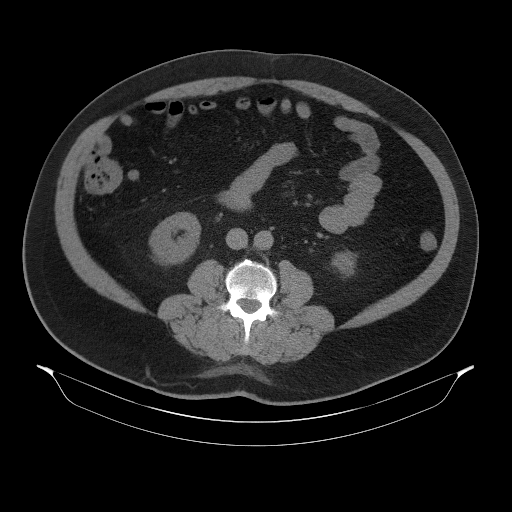
[im 68/102  soft-tissue]
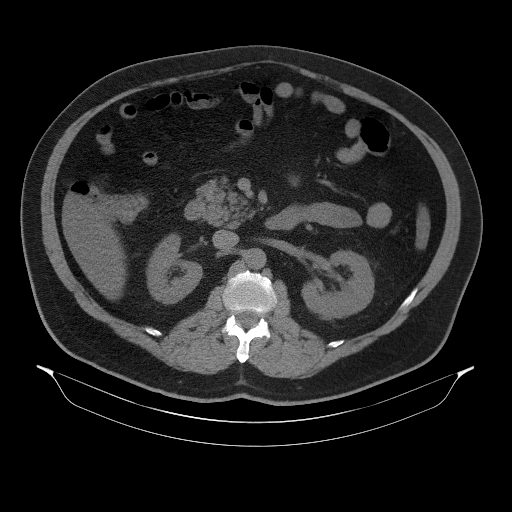
[im 68/102  bone]
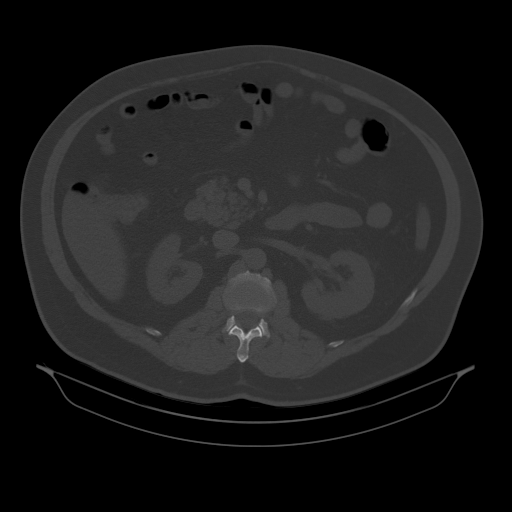
[im 73/102  soft-tissue]
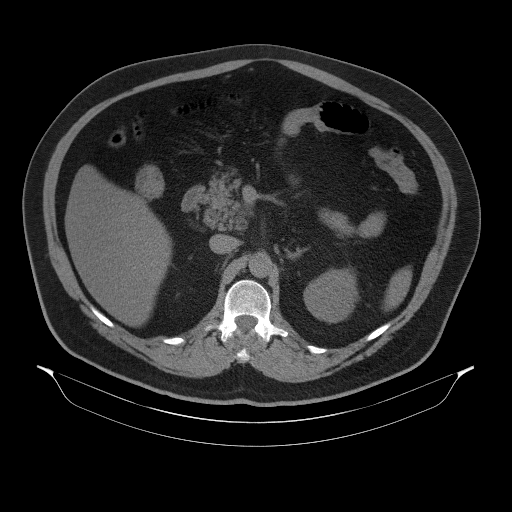
[im 79/102  soft-tissue]
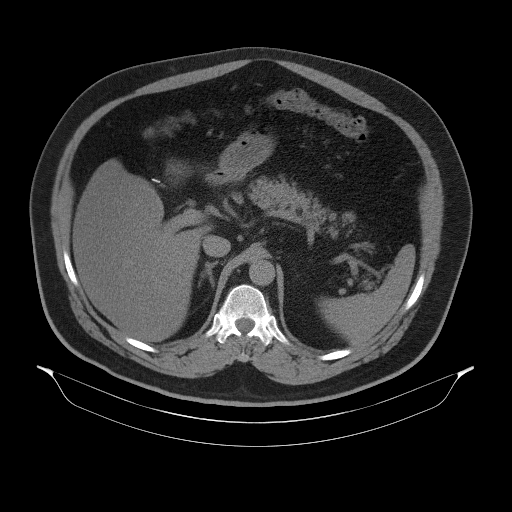
[im 79/102  lung]
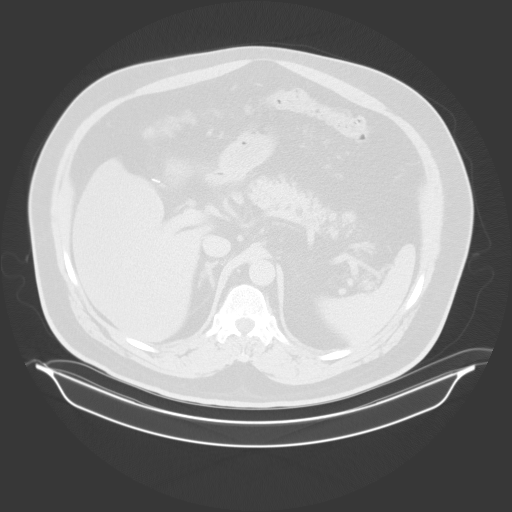
[im 85/102  lung]
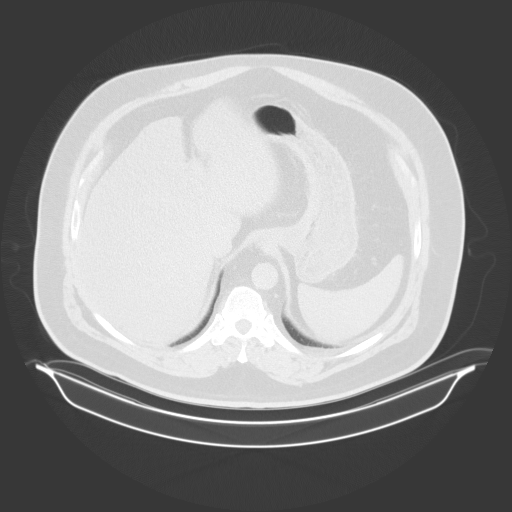
[im 90/102  soft-tissue]
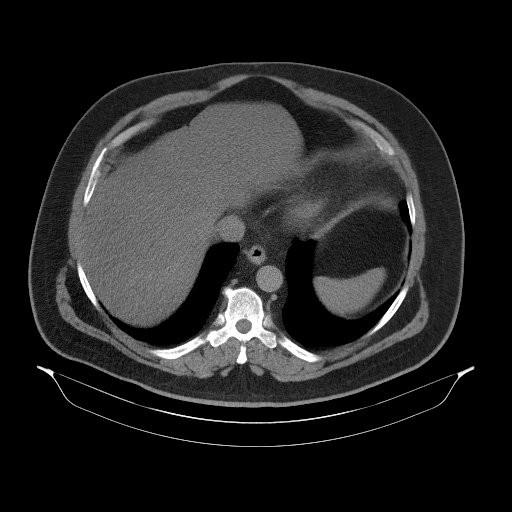
[im 90/102  lung]
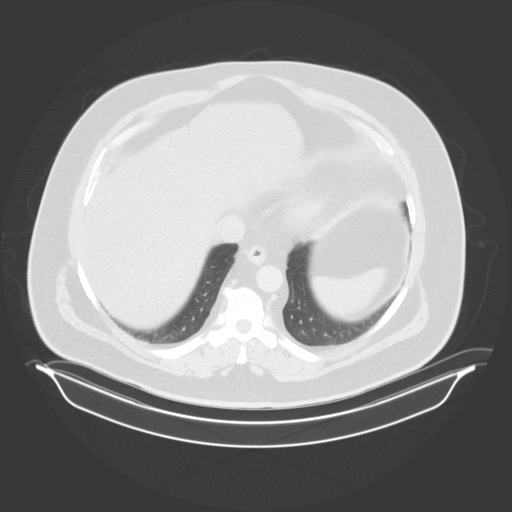
[im 96/102  soft-tissue]
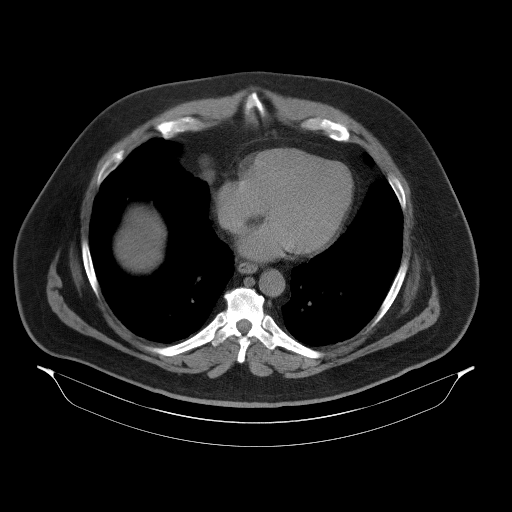
[im 96/102  lung]
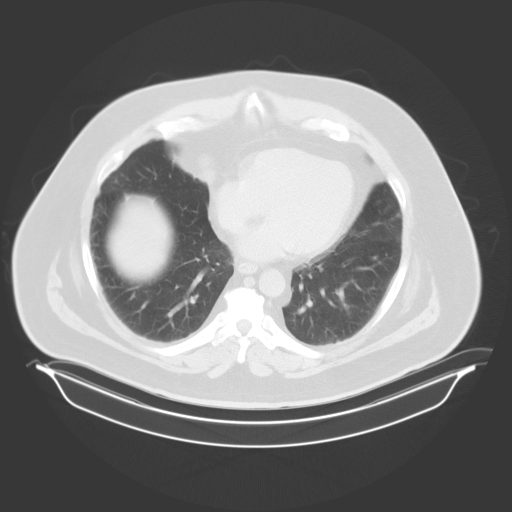

[14 of 32 positions shown; findings below may reference images not displayed]

FINDINGS: Lower chest: No acute abnormality. Unchanged fluid attenuation
lesion adjacent to the right atrium, likely a pericardial
duplication cyst.

Hepatobiliary: Hepatic steatosis. No focal liver abnormality is
seen. Status post cholecystectomy.

Pancreas: Unremarkable. No pancreatic ductal dilatation or
surrounding inflammatory changes.

Spleen: Normal in size without focal abnormality.

Adrenals/Urinary Tract: Adrenal glands are unremarkable. Kidneys are
normal, without renal calculi, focal lesion, or hydronephrosis.
Bladder is unremarkable.

Stomach/Bowel: Stomach is within normal limits. Appendix appears
normal. No evidence of bowel wall thickening, distention, or
inflammatory changes.

Vascular/Lymphatic: No significant vascular findings are present. No
enlarged abdominal or pelvic lymph nodes.

Reproductive: No mass or other abnormality.

Other: No abdominal wall hernia or abnormality. No abdominopelvic
ascites.

Musculoskeletal: No acute or significant osseous findings.
IMPRESSION: 1. No acute noncontrast CT findings of the abdomen or pelvis to
explain right lower quadrant pain.

2.  Normal appendix.

3.  Hepatic steatosis.

4.  Chronic and incidental findings as detailed above.

## 2020-03-10 ENCOUNTER — Ambulatory Visit: Payer: Medicare Other | Admitting: Sports Medicine

## 2020-03-10 ENCOUNTER — Other Ambulatory Visit: Payer: Self-pay | Admitting: Sports Medicine

## 2020-03-10 ENCOUNTER — Encounter: Payer: Self-pay | Admitting: Sports Medicine

## 2020-03-10 ENCOUNTER — Other Ambulatory Visit: Payer: Self-pay

## 2020-03-10 DIAGNOSIS — M79672 Pain in left foot: Secondary | ICD-10-CM

## 2020-03-10 DIAGNOSIS — M62172 Other rupture of muscle (nontraumatic), left ankle and foot: Secondary | ICD-10-CM

## 2020-03-10 DIAGNOSIS — M722 Plantar fascial fibromatosis: Secondary | ICD-10-CM | POA: Diagnosis not present

## 2020-03-10 DIAGNOSIS — G8929 Other chronic pain: Secondary | ICD-10-CM

## 2020-03-10 MED ORDER — TRIAMCINOLONE ACETONIDE 40 MG/ML IJ SUSP
20.0000 mg | Freq: Once | INTRAMUSCULAR | Status: AC
  Administered 2020-03-10: 20 mg

## 2020-03-10 NOTE — Patient Instructions (Signed)
Pre-Operative Instructions  Congratulations, you have decided to take an important step towards improving your quality of life.  You can be assured that the doctors and staff at [facility] will be with you every step of the way.  Here are some important things you should know:  1. Plan to be at the surgery center/hospital at least 1 (one) hour prior to your scheduled time, unless otherwise directed by the surgical center/hospital staff.  You must have a responsible adult accompany you, remain during the surgery and drive you home.  Make sure you have directions to the surgical center/hospital to ensure you arrive on time. 2. If you are having surgery at [facility] or [facility], you will need a copy of your medical history and physical form from your family physician within one month prior to the date of surgery. We will give you a form for your primary physician to complete.  3. We make every effort to accommodate the date you request for surgery.  However, there are times where surgery dates or times have to be moved.  We will contact you as soon as possible if a change in schedule is required.   4. No aspirin/ibuprofen for one week before surgery.  If you are on aspirin, any non-steroidal anti-inflammatory medications (Mobic, Aleve, Ibuprofen) should not be taken seven (7) days prior to your surgery.  You make take Tylenol for pain prior to surgery.  5. Medications - If you are taking daily heart and blood pressure medications, seizure, reflux, allergy, asthma, anxiety, pain or diabetes medications, make sure you notify the surgery center/hospital before the day of surgery so they can tell you which medications you should take or avoid the day of surgery. 6. No food or drink after midnight the night before surgery unless directed otherwise by surgical center/hospital staff. 7. No alcoholic beverages 24-hours prior to surgery.  No smoking 24-hours prior or 24-hours after  surgery. 8. Wear loose pants or shorts. They should be loose enough to fit over bandages, boots, and casts. 9. Don't wear slip-on shoes. Sneakers are preferred. 10. Bring your boot with you to the surgery center/hospital.  Also bring crutches or a walker if your physician has prescribed it for you.  If you do not have this equipment, it will be provided for you after surgery. 11. If you have not been contacted by the surgery center/hospital by the day before your surgery, call to confirm the date and time of your surgery. 12. Leave-time from work may vary depending on the type of surgery you have.  Appropriate arrangements should be made prior to surgery with your employer. 13. Prescriptions will be provided immediately following surgery by your doctor.  Fill these as soon as possible after surgery and take the medication as directed. Pain medications will not be refilled on weekends and must be approved by the doctor. 14. Remove nail polish on the operative foot and avoid getting pedicures prior to surgery. 15. Wash the night before surgery.  The night before surgery wash the foot and leg well with water and the antibacterial soap provided. Be sure to pay special attention to beneath the toenails and in between the toes.  Wash for at least three (3) minutes. Rinse thoroughly with water and dry well with a towel.  Perform this wash unless told not to do so by your physician.  Enclosed: 1 Ice pack (please put in freezer the night before surgery)   1 Hibiclens skin cleaner     Pre-op instructions  If you have any questions regarding the instructions, please do not hesitate to call our office.  [place]: [address], [place], [place] [address] -- [phone]  [place]: [address] [place], [place] [address] -- [phone]  [place]: [address], [place], [place] [address] -- [phone]   Website: [email or web address] 

## 2020-03-10 NOTE — Progress Notes (Signed)
Subjective: Tanner Phillips is a 64 y.o. male returns to office for follow up evaluation left heel pain.  Patient reports that the pain is back nothing is helping the last shot helped for a while but the pain is now back 10 out of 10 worse with first few steps out of bed in the morning has been stretching and using good supportive shoes and taking Motrin without any improvement.  Patient Active Problem List   Diagnosis Date Noted  . 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 amLODipine (NORVASC) 5 MG tablet Take 5 mg by mouth daily.    . fexofenadine (ALLEGRA) 180 MG tablet Take 180 mg by mouth daily.    . meloxicam (MOBIC) 15 MG tablet Take 1 tablet (15 mg total) by mouth daily. 30 tablet 0  . zolpidem (AMBIEN) 10 MG tablet Take 10 mg by mouth at bedtime as needed for sleep.     No current facility-administered medications on file prior to visit.    No Known Allergies   Past Surgical History:  Procedure Laterality Date  . BACK SURGERY     LOWER  . CHOLECYSTECTOMY    . COLONOSCOPY WITH PROPOFOL N/A 06/03/2015   Procedure: COLONOSCOPY WITH PROPOFOL;  Surgeon: Juanita Craver, MD;  Location: [facility];  Service: Endoscopy;  Laterality: N/A;  . Left Shoulder Surgery    . Right Shoulder Surgery      No family history on file.  Social History   Socioeconomic History  . Marital status: Married    Spouse name: Not on file  . Number of children: Not on file  . Years of education: Not on file  . Highest education level: Not on file  Occupational History  . Not on file  Tobacco Use  . Smoking status: Never Smoker  . Smokeless tobacco: Never Used  Substance and Sexual Activity  . Alcohol use: Yes    Comment: social  . Drug use: No  . Sexual activity: Not on file  Other Topics Concern  . Not on file  Social History Narrative   MARRIED -WORKS AT [employer]   Social Determinants of Health   Financial Resource Strain:   . Difficulty of Paying Living Expenses:   Food Insecurity:   . Worried About Charity fundraiser in the Last Year:   . Arboriculturist in the Last Year:   Transportation Needs:   . Film/video editor (Medical):   Marland Kitchen Lack of Transportation (Non-Medical):   Physical Activity:   . Days of Exercise per Week:   . Minutes of Exercise per Session:   Stress:   . Feeling of Stress :   Social Connections:   . Frequency of Communication with Friends and Family:   . Frequency of Social Gatherings with Friends and Family:   . Attends Religious Services:   . Active Member of Clubs or Organizations:   . Attends Archivist Meetings:   Marland Kitchen Marital Status:      Objective:   General:  Alert and oriented x 3, in no acute distress  Dermatology: Skin is warm, dry, and supple bilateral. Nails are within normal limits. There is no lower extremity erythema, no eccymosis, no open lesions present bilateral.   Vascular: Dorsalis Pedis and Posterior Tibial pedal pulses are 2/4 bilateral. + hair growth noted bilateral. Capillary Fill  Time is 3 seconds in all digits. No varicosities, No edema bilateral lower extremities.   Neurological: Sensation grossly intact to light touch bilateral.  Musculoskeletal: There is tenderness to palpation at the medial calcaneal tubercale and through the insertion of the plantar fascia on the Left foot. No pain with compression to calcaneus or application of tuning fork. There is decreased Ankle joint range of motion bilateral. All other jointsrange of motion within normal limits bilateral. Strength 5/5 bilateral.   Assessment and Plan: Problem List Items Addressed This Visit    None    Visit Diagnoses    Plantar fasciitis of left foot    -  Primary   Pain of left heel       Chronic pain of left heel          -Complete examination performed.  -Previous x-rays reviewed. -Discussed  with patient in detail the condition of plantar fasciitis, how this  occurs related to the foot type of the patient and general treatment options. - Patient opted for another injection today; After oral consent and aseptic prep, injected a mixture containing 1 ml of 1%plain lidocaine, 1 ml 0.5% plain marcaine, 0.5 ml of kenalog 40 and 0.5 ml of dexmethasone phosphate to left heel at area of most pain/trigger point injection. This is inj #1 to the area for this year however due to the chronic nature of patient's symptoms do think that he qualifies for surgical intervention since he has failed all conservative treatment and pain continues to recur -Patient opt for surgical management. Consent obtained for EPF left foot Pre and Post op course explained. Risks, benefits, alternatives explained. No guarantees given or implied. Surgical booking slip submitted and provided patient with Surgical packet and info for [facility]. To dispense cam boot at surgical center and will allow weightbearing with boot -Advised patient that after surgery to use night splint and meanwhile continue with gentle stretching rest ice elevation. -Patient to return to office after surgery or sooner if problems or issues arise. Landis Martins, DPM

## 2020-03-11 ENCOUNTER — Telehealth: Payer: Self-pay

## 2020-03-11 NOTE — Telephone Encounter (Signed)
DOS 03/29/2020  EPT LT - [place] EFFECTIVE DATE - 10/10/2019  PLAN DEDUCTIBLE - $0.00 OUT OF POCKET - $3600 W/ [id number] REMAINING  CO-INSURANCE 0% / Day OUTPATIENT SURGERY 0% / [place] $295 / Day OUTPATIENT SURGERY $295 / [place]   Notification or Prior Authorization is not required for the requested services  Decision ID [id number]

## 2020-03-29 ENCOUNTER — Telehealth: Payer: Self-pay

## 2020-03-29 NOTE — Telephone Encounter (Signed)
Tanner Phillips called wanting to cancel his surgery on 04/05/20. He stated that he has been wearing the boot and his foot feels much better. He stated he will call if he starts having problems. Left message for Caren Griffins at [facility].

## 2020-04-07 ENCOUNTER — Encounter: Payer: Medicare Other | Admitting: Sports Medicine

## 2020-04-13 ENCOUNTER — Encounter: Payer: Medicare Other | Admitting: Podiatry

## 2020-04-21 ENCOUNTER — Encounter: Payer: Medicare Other | Admitting: Sports Medicine

## 2020-04-28 ENCOUNTER — Encounter: Payer: Medicare Other | Admitting: Sports Medicine

## 2020-05-05 ENCOUNTER — Encounter: Payer: Medicare Other | Admitting: Sports Medicine

## 2020-05-18 ENCOUNTER — Other Ambulatory Visit: Payer: Self-pay | Admitting: Gastroenterology

## 2020-06-16 ENCOUNTER — Other Ambulatory Visit: Payer: Self-pay

## 2020-06-16 ENCOUNTER — Ambulatory Visit
Admission: RE | Admit: 2020-06-16 | Discharge: 2020-06-16 | Disposition: A | Discharge status: Home / Self Care | Payer: Medicare Other | Source: Ambulatory Visit | Attending: Internal Medicine | Admitting: Internal Medicine

## 2020-06-16 ENCOUNTER — Other Ambulatory Visit: Payer: Self-pay | Admitting: Internal Medicine

## 2020-06-16 DIAGNOSIS — J189 Pneumonia, unspecified organism: Secondary | ICD-10-CM

## 2020-07-23 ENCOUNTER — Other Ambulatory Visit (HOSPITAL_COMMUNITY)
Admission: RE | Admit: 2020-07-23 | Discharge: 2020-07-23 | Disposition: A | Discharge status: Home / Self Care | Payer: Medicare Other | Source: Ambulatory Visit | Attending: Gastroenterology | Admitting: Gastroenterology

## 2020-07-23 DIAGNOSIS — Z01812 Encounter for preprocedural laboratory examination: Secondary | ICD-10-CM | POA: Insufficient documentation

## 2020-07-23 DIAGNOSIS — Z20822 Contact with and (suspected) exposure to covid-19: Secondary | ICD-10-CM | POA: Insufficient documentation

## 2020-07-23 LAB — SARS CORONAVIRUS 2 (TAT 6-24 HRS): SARS Coronavirus 2: NEGATIVE

## 2020-07-26 NOTE — Anesthesia Preprocedure Evaluation (Addendum)
Anesthesia Evaluation  Patient identified by MRN, date of birth, ID band Patient awake    Reviewed: Allergy & Precautions, NPO status , Patient's Chart, lab work & pertinent test results  Airway Mallampati: II  TM Distance: >3 FB Neck ROM: Full    Dental  (+) Dental Advisory Given, Teeth Intact   Pulmonary    Pulmonary exam normal breath sounds clear to auscultation       Cardiovascular + CAD and + Past MI  Normal cardiovascular exam Rhythm:Regular Rate:Normal     Neuro/Psych negative neurological ROS  negative psych ROS   GI/Hepatic negative GI ROS, Neg liver ROS,   Endo/Other  negative endocrine ROS  Renal/GU negative Renal ROS     Musculoskeletal negative musculoskeletal ROS (+)   Abdominal   Peds  Hematology negative hematology ROS (+)   Anesthesia Other Findings   Reproductive/Obstetrics                            Anesthesia Physical  Anesthesia Plan  ASA: III  Anesthesia Plan: MAC   Post-op Pain Management:    Induction: Intravenous  PONV Risk Score and Plan: 1 and Propofol infusion, TIVA, Treatment may vary due to age or medical condition and Ondansetron  Airway Management Planned: Natural Airway and Simple Face Mask  Additional Equipment: None  Intra-op Plan:   Post-operative Plan:   Informed Consent: I have reviewed the patients History and Physical, chart, labs and discussed the procedure including the risks, benefits and alternatives for the proposed anesthesia with the patient or authorized representative who has indicated his/her understanding and acceptance.     Dental advisory given  Plan Discussed with: CRNA  Anesthesia Plan Comments:        Anesthesia Quick Evaluation

## 2020-07-27 ENCOUNTER — Encounter (HOSPITAL_COMMUNITY): Payer: Self-pay | Admitting: Gastroenterology

## 2020-07-27 ENCOUNTER — Encounter (HOSPITAL_COMMUNITY)
Admission: RE | Disposition: A | Discharge status: Home / Self Care | Payer: Self-pay | Source: Home / Self Care | Attending: Gastroenterology

## 2020-07-27 ENCOUNTER — Other Ambulatory Visit: Payer: Self-pay

## 2020-07-27 ENCOUNTER — Ambulatory Visit (HOSPITAL_COMMUNITY): Payer: Medicare Other | Admitting: Anesthesiology

## 2020-07-27 ENCOUNTER — Ambulatory Visit (HOSPITAL_COMMUNITY)
Admission: RE | Admit: 2020-07-27 | Discharge: 2020-07-27 | Disposition: A | Discharge status: Home / Self Care | Payer: Medicare Other | Attending: Gastroenterology | Admitting: Gastroenterology

## 2020-07-27 DIAGNOSIS — E668 Other obesity: Secondary | ICD-10-CM | POA: Insufficient documentation

## 2020-07-27 DIAGNOSIS — Z6841 Body Mass Index (BMI) 40.0 and over, adult: Secondary | ICD-10-CM | POA: Insufficient documentation

## 2020-07-27 DIAGNOSIS — Z79899 Other long term (current) drug therapy: Secondary | ICD-10-CM | POA: Insufficient documentation

## 2020-07-27 DIAGNOSIS — I252 Old myocardial infarction: Secondary | ICD-10-CM | POA: Diagnosis not present

## 2020-07-27 DIAGNOSIS — Z1211 Encounter for screening for malignant neoplasm of colon: Secondary | ICD-10-CM | POA: Diagnosis present

## 2020-07-27 DIAGNOSIS — I2510 Atherosclerotic heart disease of native coronary artery without angina pectoris: Secondary | ICD-10-CM | POA: Insufficient documentation

## 2020-07-27 DIAGNOSIS — Z800 Family history of malignant neoplasm of digestive organs: Secondary | ICD-10-CM | POA: Diagnosis not present

## 2020-07-27 DIAGNOSIS — Z9049 Acquired absence of other specified parts of digestive tract: Secondary | ICD-10-CM | POA: Diagnosis not present

## 2020-07-27 HISTORY — PX: ESOPHAGOGASTRODUODENOSCOPY (EGD) WITH PROPOFOL: SHX5813

## 2020-07-27 HISTORY — PX: COLONOSCOPY WITH PROPOFOL: SHX5780

## 2020-07-27 SURGERY — ESOPHAGOGASTRODUODENOSCOPY (EGD) WITH PROPOFOL
Anesthesia: Monitor Anesthesia Care

## 2020-07-27 MED ORDER — PROPOFOL 1000 MG/100ML IV EMUL
INTRAVENOUS | Status: AC
  Filled 2020-07-27: qty 100

## 2020-07-27 MED ORDER — SODIUM CHLORIDE 0.9 % IV SOLN: INTRAVENOUS | Status: DC

## 2020-07-27 MED ORDER — LACTATED RINGERS IV SOLN
INTRAVENOUS | Status: DC
  Administered 2020-07-27: 1000 mL via INTRAVENOUS

## 2020-07-27 MED ORDER — LIDOCAINE 2% (20 MG/ML) 5 ML SYRINGE
INTRAMUSCULAR | Status: DC | PRN
  Administered 2020-07-27: 100 mg via INTRAVENOUS

## 2020-07-27 MED ORDER — PROPOFOL 10 MG/ML IV BOLUS
INTRAVENOUS | Status: DC | PRN
  Administered 2020-07-27: 10 mg via INTRAVENOUS
  Administered 2020-07-27: 30 mg via INTRAVENOUS

## 2020-07-27 MED ORDER — PHENYLEPHRINE 40 MCG/ML (10ML) SYRINGE FOR IV PUSH (FOR BLOOD PRESSURE SUPPORT)
PREFILLED_SYRINGE | INTRAVENOUS | Status: DC | PRN
  Administered 2020-07-27: 80 ug via INTRAVENOUS

## 2020-07-27 MED ORDER — ONDANSETRON HCL 4 MG/2ML IJ SOLN
INTRAMUSCULAR | Status: DC | PRN
  Administered 2020-07-27: 4 mg via INTRAVENOUS

## 2020-07-27 MED ORDER — PROPOFOL 500 MG/50ML IV EMUL
INTRAVENOUS | Status: DC | PRN
  Administered 2020-07-27: 150 ug/kg/min via INTRAVENOUS

## 2020-07-27 SURGICAL SUPPLY — 24 items

## 2020-07-27 NOTE — Op Note (Signed)
[facility] Patient Name: Tanner Phillips Procedure Date: 07/27/2020 MRN: [id number] Attending MD: Juanita Craver , MD Date of Birth: June 13, 1956 CSN: [id number] Age: 64 Admit Type: Outpatient Procedure:                Diagnostic EGD. Indications:              Gastro-esophageal reflux disease, Chest pain (non                            cardiac) Providers:                Juanita Craver, MD, Carmie End, RN, Erenest Rasher, RN, Elspeth Cho Technician, Lesia Sago, Technician, Virgia Land, CRNA Referring MD:             Jilda Panda, MD Medicines:                Monitored Anesthesia Care Complications:            No immediate complications. Estimated Blood Loss:     Estimated blood loss: none. Procedure:                Pre-Anesthesia Assessment: - Prior to the                            procedure, a history and physical was performed,                            and patient medications and allergies were                            reviewed. The patient's tolerance of previous                            anesthesia was also reviewed. The risks and                            benefits of the procedure and the sedation options                            and risks were discussed with the patient. All                            questions were answered, and informed consent was                            obtained. Prior Anticoagulants: The patient has                            taken no previous anticoagulant or antiplatelet                            agents.  ASA Grade Assessment: III - A patient with                            severe systemic disease. After reviewing the risks                            and benefits, the patient was deemed in                            satisfactory condition to undergo the procedure.                            After obtaining informed consent, the endoscope was                             passed under direct vision. Throughout the                            procedure, the patient's blood pressure, pulse, and                            oxygen saturations were monitored continuously. The                            GIF-H190 ([id number]) Olympus gastroscope was                            introduced through the mouth, and advanced to the                            second part of duodenum. The EGD was accomplished                            without difficulty. The patient tolerated the                            procedure well. Scope In: Scope Out: Findings:      The examined esophagus and the GEJ was normal; SCJ was measutred at 40       cm.      The entire examined stomach was normal.      The cardia and gastric fundus were normal on retroflexion.      The examined duodenum was normal. Impression:               - Normal appearing, widely patent esophagus and GEJ.                           - Normal stomach.                           - Normal examined duodenum.                           - No specimens collected. Moderate Sedation:      MAC used. Recommendation:           -  High fiber, low fat diet with augmented water                            consumption daily.                           - Continue present medications; do not use NSAIDS                            like Voltaren, Meloxicam etc.                           - Return to my office PRN. Procedure Code(s):        --- Professional ---                           [phone], Esophagogastroduodenoscopy, flexible,                            transoral; diagnostic, including collection of                            specimen(s) by brushing or washing, when performed                            (separate procedure) Diagnosis Code(s):        --- Professional ---                           K21.9, Gastro-esophageal reflux disease without                            esophagitis                           R07.89, Other chest pain CPT  copyright 2019 American Medical Association. All rights reserved. The codes documented in this report are preliminary and upon coder review may  be revised to meet current compliance requirements. Juanita Craver, MD Juanita Craver, MD 07/27/2020 7:51:37 AM This report has been signed electronically. Number of Addenda: 0

## 2020-07-27 NOTE — H&P (Signed)
Tanner Phillips is an 64 y.o. male.   Chief Complaint: Colorectal cancer screening/atypical chest pain. HPI: [name] is a 64 year old white male with multiple medical problems listed below who presents to [facility] today for a screening colonoscopy.  An EGD is planned today as well as he had some atypical chest pain. See office notes for further details  Past Medical History:  Diagnosis Date  . Chest pain   . History of Bell's palsy   . Knee pain   . Myocardial infarction ([place])    14-15 yrs. ago  . Obesity   . SOB (shortness of breath)    Past Surgical History:  Procedure Laterality Date  . BACK SURGERY     LOWER  . CHOLECYSTECTOMY    . COLONOSCOPY WITH PROPOFOL N/A 06/03/2015   Procedure: COLONOSCOPY WITH PROPOFOL;  Surgeon: Juanita Craver, MD;  Location: [facility];  Service: Endoscopy;  Laterality: N/A;  . Left Shoulder Surgery    . Right Shoulder Surgery     History reviewed. No pertinent family history. Social History:  reports that he has never smoked. He has never used smokeless tobacco. He reports current alcohol use. He reports that he does not use drugs.  Allergies: No Known Allergies  Medications Prior to Admission  Medication Sig Dispense Refill  . amLODipine (NORVASC) 5 MG tablet Take 5 mg by mouth daily.    Marland Kitchen omeprazole (PRILOSEC) 40 MG capsule Take 40 mg by mouth every morning.    . zolpidem (AMBIEN) 10 MG tablet Take 10 mg by mouth at bedtime.     . fexofenadine (ALLEGRA) 180 MG tablet Take 180 mg by mouth daily. (Patient not taking: Reported on 07/19/2020)    . guaiFENesin (MUCINEX) 600 MG 12 hr tablet Take 600 mg by mouth 2 (two) times daily as needed for cough or to loosen phlegm.    . meloxicam (MOBIC) 15 MG tablet Take 1 tablet (15 mg total) by mouth daily. (Patient not taking: Reported on 07/19/2020) 30 tablet 0    No results found for this or any previous visit (from the past 48 hour(s)). No results found.  Review of Systems   Constitutional: Negative.   HENT: Negative.   Eyes: Negative.   Respiratory: Negative for chest tightness.   Cardiovascular: Positive for chest pain.  Gastrointestinal: Negative.   Endocrine: Negative.   Genitourinary: Negative.   Musculoskeletal: Negative.   Skin: Negative.   Allergic/Immunologic: Negative.   Neurological: Positive for headaches.  Hematological: Negative.   Psychiatric/Behavioral: Negative.    Blood pressure (!) 169/80, pulse 78, temperature 98.2 F (36.8 C), temperature source Oral, resp. rate 17, height 5\' 5"  (1.651 m), weight 117 kg, SpO2 96 %. Physical Exam Constitutional:      Appearance: Normal appearance.  HENT:     Head: Normocephalic and atraumatic.     Mouth/Throat:     Mouth: Mucous membranes are dry.  Eyes:     Extraocular Movements: Extraocular movements intact.     Pupils: Pupils are equal, round, and reactive to light.  Cardiovascular:     Rate and Rhythm: Normal rate and regular rhythm.     Pulses: Normal pulses.     Heart sounds: Normal heart sounds.  Abdominal:     General: Abdomen is flat.     Palpations: Abdomen is soft.  Musculoskeletal:        General: Normal range of motion.     Cervical back: Normal range of motion and neck supple.  Neurological:     General: No focal deficit present.     Mental Status: He is alert. He is disoriented.  Psychiatric:        Mood and Affect: Mood normal.        Behavior: Behavior normal.        Thought Content: Thought content normal.        Judgment: Judgment normal.    Assessment/Plan Colorectal cancer screening/chest pain-proceed with a EGD and a colonoscopy at this time.  Juanita Craver, MD 07/27/2020, 7:04 AM

## 2020-07-27 NOTE — Anesthesia Procedure Notes (Signed)
Procedure Name: MAC Date/Time: 07/27/2020 7:13 AM Performed by: Maxwell Caul, CRNA Pre-anesthesia Checklist: Patient identified, Emergency Drugs available, Suction available and Patient being monitored Oxygen Delivery Method: Simple face mask

## 2020-07-27 NOTE — Op Note (Signed)
[facility] Patient Name: Tanner Phillips Procedure Date: 07/27/2020 MRN: [id number] Attending MD: Juanita Craver , MD Date of Birth: 1955/12/28 CSN: [id number] Age: 64 Admit Type: Outpatient Procedure:                Screening colonoscopy. Indications:              Personal history of colonic polyps; Family history                            of colon cancer-father; CRC screening for                            colorectal malignant neoplasm Providers:                Juanita Craver, MD, Carmie End, RN, Erenest Rasher, RN, Elspeth Cho, Technician, Lesia Sago, Technician, Virgia Land, CRNA Referring MD:             Jilda Panda, MD Medicines:                Monitored Anesthesia Care Complications:            No immediate complications. Estimated Blood Loss:     Estimated blood loss: none. Procedure:                Pre-Anesthesia Assessment: - Prior to the                            procedure, a history and physical was performed,                            and patient medications and allergies were                            reviewed. The patient's tolerance of previous                            anesthesia was also reviewed. The risks and                            benefits of the procedure and the sedation options                            and risks were discussed with the patient. All                            questions were answered, and informed consent was                            obtained. Prior Anticoagulants: The patient has  taken no previous anticoagulant or antiplatelet                            agents. ASA Grade Assessment: III - A patient with                            severe systemic disease. After reviewing the risks                            and benefits, the patient was deemed in                            satisfactory condition to undergo the procedure.                             After obtaining informed consent, the colonoscope                            was passed under direct vision. Throughout the                            procedure, the patient's blood pressure, pulse, and                            oxygen saturations were monitored continuously. The                            CF-HQ190L ([id number] Olympus colonoscope was                            introduced through the anus and advanced to the the                            cecum, identified by appendiceal orifice and                            ileocecal valve. The colonoscopy was performed                            without difficulty. The patient tolerated the                            procedure well. The quality of the bowel                            preparation was adequate. The ileocecal valve, the                            appendiceal orifice and the rectum were                            photographed. The bowel preparation used was  Clenpiq via split dose instruction. Scope In: 7:30:17 AM Scope Out: 7:39:15 AM Scope Withdrawal Time: 0 hours 6 minutes 2 seconds  Total Procedure Duration: 0 hours 8 minutes 58 seconds  Findings:      The entire examined colon appeared normal.      No additional abnormalities were found on retroflexion. Impression:               - The entire examined colon is normal.                           - No specimens collected. Moderate Sedation:      MAC used. Recommendation:           - High fiber, low fat diet with augmented water                            consumption daily.                           - Continue present medications.                           - Repeat colonoscopy in 5 years for surveillance.                           - Return to GI office PRN.                           - If the patient has any abnormal GI symptoms in                            the interim, he has been advised to call the office                             ASAP for further recommendations. Procedure Code(s):        --- Professional ---                           [phone], Colonoscopy, flexible; diagnostic, including                            collection of specimen(s) by brushing or washing,                            when performed (separate procedure) Diagnosis Code(s):        --- Professional ---                           Z86.010, Personal history of colonic polyps                           Z80.0, Family history of malignant neoplasm of                            digestive organs  Z12.11, Encounter for screening for malignant                            neoplasm of colon CPT copyright 2019 [employer]. All rights reserved. The codes documented in this report are preliminary and upon coder review may  be revised to meet current compliance requirements. Juanita Craver, MD Juanita Craver, MD 07/27/2020 7:56:50 AM This report has been signed electronically. Number of Addenda: 0

## 2020-07-27 NOTE — Anesthesia Postprocedure Evaluation (Signed)
Anesthesia Post Note  Patient: Tanner Phillips  Procedure(s) Performed: ESOPHAGOGASTRODUODENOSCOPY (EGD) WITH PROPOFOL (N/A ) COLONOSCOPY WITH PROPOFOL (N/A )     Patient location during evaluation: PACU Anesthesia Type: MAC Level of consciousness: awake and alert Pain management: pain level controlled Vital Signs Assessment: post-procedure vital signs reviewed and stable Respiratory status: spontaneous breathing Cardiovascular status: stable Anesthetic complications: no   No complications documented.  Last Vitals:  Vitals:   07/27/20 0800 07/27/20 0810  BP: 127/73 126/72  Pulse: 78 71  Resp: 19 18  Temp:    SpO2: 93% 96%    Last Pain:  Vitals:   07/27/20 0810  TempSrc:   PainSc: 0-No pain                 Nolon Nations

## 2020-07-27 NOTE — Discharge Instructions (Signed)

## 2020-07-27 NOTE — Transfer of Care (Signed)
Immediate Anesthesia Transfer of Care Note  Patient: Tanner Phillips  Procedure(s) Performed: ESOPHAGOGASTRODUODENOSCOPY (EGD) WITH PROPOFOL (N/A ) COLONOSCOPY WITH PROPOFOL (N/A )  Patient Location: PACU and Endoscopy Unit  Anesthesia Type:MAC  Level of Consciousness: awake, alert  and oriented  Airway & Oxygen Therapy: Patient Spontanous Breathing and Patient connected to face mask oxygen  Post-op Assessment: Report given to RN and Post -op Vital signs reviewed and stable  Post vital signs: Reviewed and stable  Last Vitals:  Vitals Value Taken Time  BP    Temp    Pulse 76 07/27/20 0746  Resp 25 07/27/20 0746  SpO2 94 % 07/27/20 0746  Vitals shown include unvalidated device data.  Last Pain:  Vitals:   07/27/20 0627  TempSrc: Oral  PainSc: 0-No pain         Complications: No complications documented.

## 2020-07-28 ENCOUNTER — Encounter (HOSPITAL_COMMUNITY): Payer: Self-pay | Admitting: Gastroenterology

## 2021-05-08 IMAGING — CR DG CHEST 2V
2 series · 2 of 2 positions shown · non-contrast
Comparison: Chest x-ray 08/26/2015.

CLINICAL DATA: Pneumonia 05/16/2020, persistent chest discomfort
with mild productive cough and shortness of breath

EXAM:
CHEST - 2 VIEW.  Patient is slightly rotated on the frontal view.

[w chest pa]
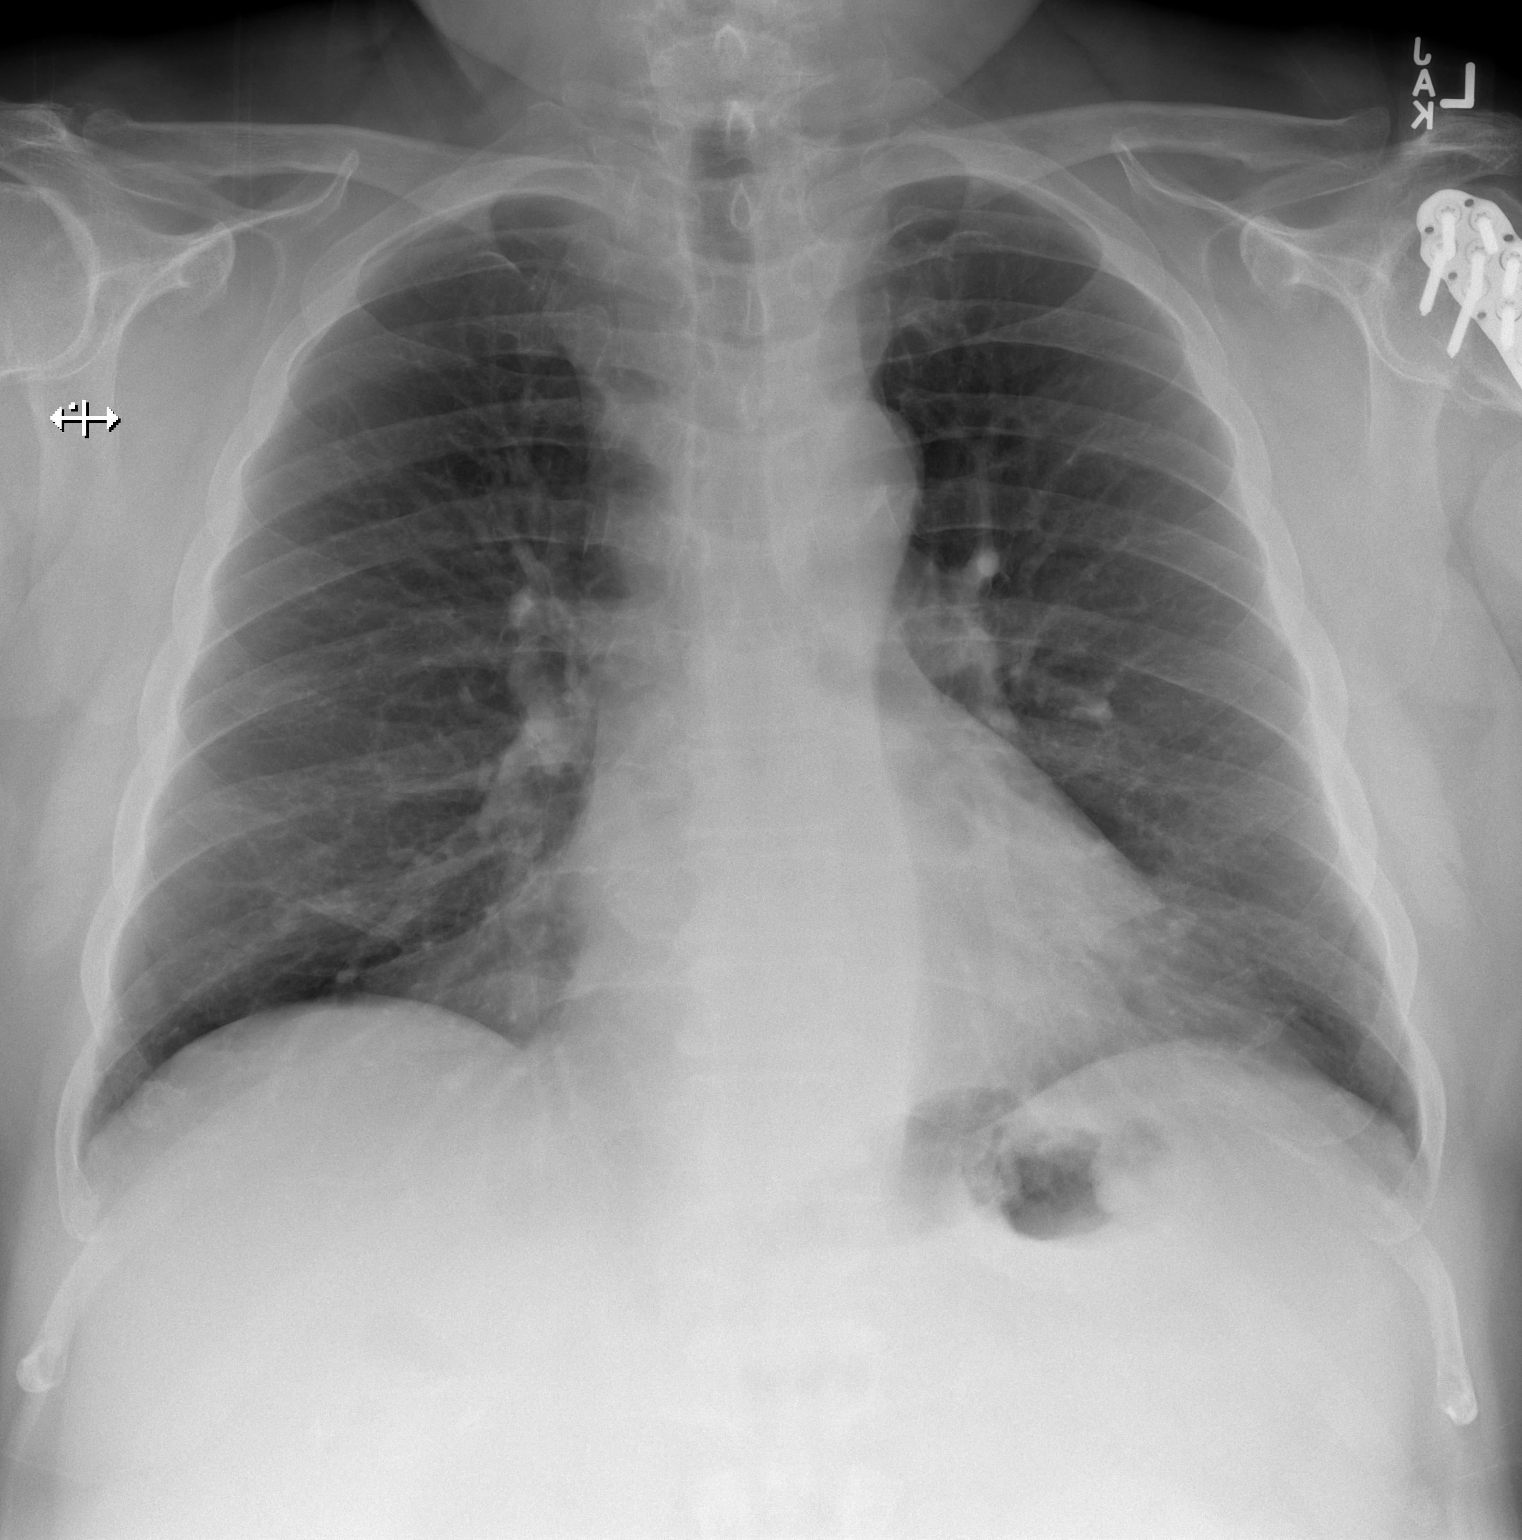

[w chest lat]
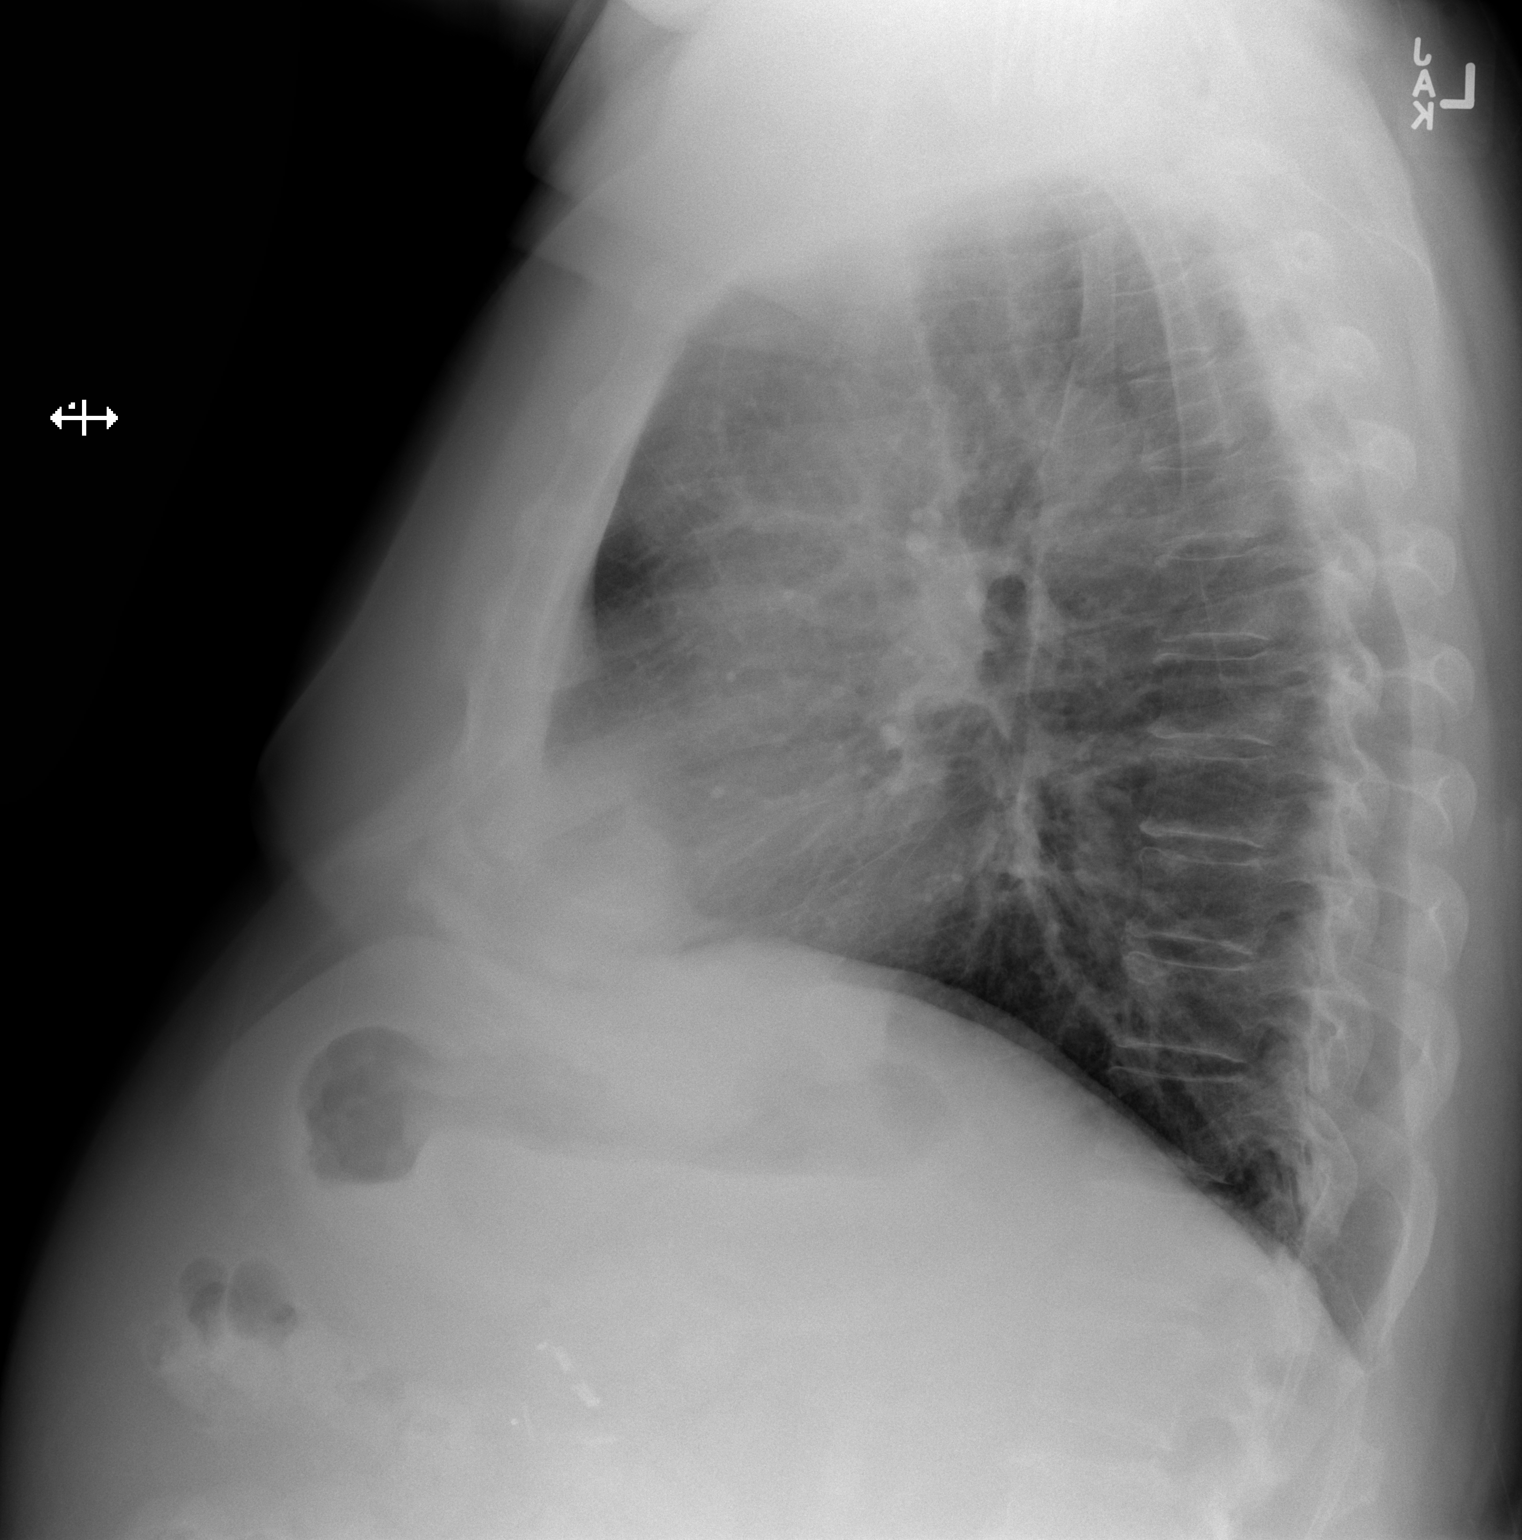

[2 of 2 positions shown; findings below may reference images not displayed]

FINDINGS: The cardiomediastinal silhouette is unremarkable.

No focal consolidation. No pulmonary edema. No pleural effusion. No
pneumothorax.

No acute osseous findings in a patient status post plate and screw
fixation of a partially visualized left humerus.
IMPRESSION: No active cardiopulmonary disease.

## 2023-08-06 ENCOUNTER — Emergency Department (HOSPITAL_COMMUNITY): Payer: Medicare Other

## 2023-08-06 ENCOUNTER — Emergency Department (HOSPITAL_BASED_OUTPATIENT_CLINIC_OR_DEPARTMENT_OTHER): Payer: Medicare Other

## 2023-08-06 ENCOUNTER — Encounter (HOSPITAL_COMMUNITY): Payer: Self-pay

## 2023-08-06 ENCOUNTER — Inpatient Hospital Stay (HOSPITAL_COMMUNITY)
Admission: EM | Admit: 2023-08-06 | Discharge: 2023-08-21 | DRG: 252 | Disposition: A | Discharge status: Home Health | Payer: Medicare Other | Attending: Internal Medicine | Admitting: Internal Medicine

## 2023-08-06 ENCOUNTER — Other Ambulatory Visit: Payer: Self-pay

## 2023-08-06 DIAGNOSIS — L0390 Cellulitis, unspecified: Secondary | ICD-10-CM | POA: Diagnosis not present

## 2023-08-06 DIAGNOSIS — M4310 Spondylolisthesis, site unspecified: Secondary | ICD-10-CM | POA: Diagnosis present

## 2023-08-06 DIAGNOSIS — I70239 Atherosclerosis of native arteries of right leg with ulceration of unspecified site: Secondary | ICD-10-CM | POA: Diagnosis present

## 2023-08-06 DIAGNOSIS — I10 Essential (primary) hypertension: Secondary | ICD-10-CM | POA: Diagnosis present

## 2023-08-06 DIAGNOSIS — J9601 Acute respiratory failure with hypoxia: Secondary | ICD-10-CM | POA: Diagnosis not present

## 2023-08-06 DIAGNOSIS — G4733 Obstructive sleep apnea (adult) (pediatric): Secondary | ICD-10-CM | POA: Diagnosis present

## 2023-08-06 DIAGNOSIS — I82432 Acute embolism and thrombosis of left popliteal vein: Secondary | ICD-10-CM | POA: Diagnosis present

## 2023-08-06 DIAGNOSIS — W19XXXA Unspecified fall, initial encounter: Secondary | ICD-10-CM | POA: Diagnosis present

## 2023-08-06 DIAGNOSIS — I70262 Atherosclerosis of native arteries of extremities with gangrene, left leg: Secondary | ICD-10-CM | POA: Diagnosis not present

## 2023-08-06 DIAGNOSIS — I739 Peripheral vascular disease, unspecified: Secondary | ICD-10-CM | POA: Diagnosis present

## 2023-08-06 DIAGNOSIS — N1831 Chronic kidney disease, stage 3a: Secondary | ICD-10-CM | POA: Diagnosis present

## 2023-08-06 DIAGNOSIS — I82452 Acute embolism and thrombosis of left peroneal vein: Secondary | ICD-10-CM | POA: Diagnosis present

## 2023-08-06 DIAGNOSIS — S0010XA Contusion of unspecified eyelid and periocular area, initial encounter: Secondary | ICD-10-CM | POA: Diagnosis present

## 2023-08-06 DIAGNOSIS — Z79899 Other long term (current) drug therapy: Secondary | ICD-10-CM

## 2023-08-06 DIAGNOSIS — G8929 Other chronic pain: Secondary | ICD-10-CM | POA: Diagnosis present

## 2023-08-06 DIAGNOSIS — I82442 Acute embolism and thrombosis of left tibial vein: Secondary | ICD-10-CM | POA: Diagnosis present

## 2023-08-06 DIAGNOSIS — L97919 Non-pressure chronic ulcer of unspecified part of right lower leg with unspecified severity: Secondary | ICD-10-CM | POA: Diagnosis present

## 2023-08-06 DIAGNOSIS — M545 Low back pain, unspecified: Secondary | ICD-10-CM | POA: Diagnosis present

## 2023-08-06 DIAGNOSIS — E785 Hyperlipidemia, unspecified: Secondary | ICD-10-CM | POA: Diagnosis present

## 2023-08-06 DIAGNOSIS — N179 Acute kidney failure, unspecified: Secondary | ICD-10-CM | POA: Diagnosis present

## 2023-08-06 DIAGNOSIS — M5410 Radiculopathy, site unspecified: Secondary | ICD-10-CM | POA: Diagnosis present

## 2023-08-06 DIAGNOSIS — I82409 Acute embolism and thrombosis of unspecified deep veins of unspecified lower extremity: Secondary | ICD-10-CM | POA: Diagnosis present

## 2023-08-06 DIAGNOSIS — L97429 Non-pressure chronic ulcer of left heel and midfoot with unspecified severity: Secondary | ICD-10-CM | POA: Diagnosis present

## 2023-08-06 DIAGNOSIS — I82462 Acute embolism and thrombosis of left calf muscular vein: Secondary | ICD-10-CM | POA: Diagnosis present

## 2023-08-06 DIAGNOSIS — L659 Nonscarring hair loss, unspecified: Secondary | ICD-10-CM | POA: Diagnosis present

## 2023-08-06 DIAGNOSIS — I2510 Atherosclerotic heart disease of native coronary artery without angina pectoris: Secondary | ICD-10-CM | POA: Diagnosis present

## 2023-08-06 DIAGNOSIS — Z23 Encounter for immunization: Secondary | ICD-10-CM

## 2023-08-06 DIAGNOSIS — M479 Spondylosis, unspecified: Secondary | ICD-10-CM | POA: Diagnosis present

## 2023-08-06 DIAGNOSIS — Z6841 Body Mass Index (BMI) 40.0 and over, adult: Secondary | ICD-10-CM

## 2023-08-06 DIAGNOSIS — M79605 Pain in left leg: Principal | ICD-10-CM

## 2023-08-06 DIAGNOSIS — M79662 Pain in left lower leg: Secondary | ICD-10-CM | POA: Diagnosis not present

## 2023-08-06 DIAGNOSIS — I252 Old myocardial infarction: Secondary | ICD-10-CM

## 2023-08-06 DIAGNOSIS — L03116 Cellulitis of left lower limb: Secondary | ICD-10-CM

## 2023-08-06 DIAGNOSIS — I82412 Acute embolism and thrombosis of left femoral vein: Secondary | ICD-10-CM | POA: Diagnosis present

## 2023-08-06 DIAGNOSIS — E8770 Fluid overload, unspecified: Secondary | ICD-10-CM | POA: Diagnosis not present

## 2023-08-06 DIAGNOSIS — R5381 Other malaise: Secondary | ICD-10-CM | POA: Diagnosis present

## 2023-08-06 DIAGNOSIS — N289 Disorder of kidney and ureter, unspecified: Secondary | ICD-10-CM

## 2023-08-06 DIAGNOSIS — M5412 Radiculopathy, cervical region: Secondary | ICD-10-CM | POA: Diagnosis present

## 2023-08-06 DIAGNOSIS — G4700 Insomnia, unspecified: Secondary | ICD-10-CM | POA: Diagnosis present

## 2023-08-06 DIAGNOSIS — R7303 Prediabetes: Secondary | ICD-10-CM | POA: Diagnosis present

## 2023-08-06 DIAGNOSIS — I743 Embolism and thrombosis of arteries of the lower extremities: Secondary | ICD-10-CM | POA: Diagnosis present

## 2023-08-06 DIAGNOSIS — L97529 Non-pressure chronic ulcer of other part of left foot with unspecified severity: Secondary | ICD-10-CM | POA: Diagnosis present

## 2023-08-06 DIAGNOSIS — I129 Hypertensive chronic kidney disease with stage 1 through stage 4 chronic kidney disease, or unspecified chronic kidney disease: Secondary | ICD-10-CM | POA: Diagnosis present

## 2023-08-06 DIAGNOSIS — Z9049 Acquired absence of other specified parts of digestive tract: Secondary | ICD-10-CM

## 2023-08-06 HISTORY — DX: Other cervical disc displacement, unspecified cervical region: M50.20

## 2023-08-06 LAB — CBC WITH DIFFERENTIAL/PLATELET
Abs Immature Granulocytes: 0.15 10*3/uL — ABNORMAL HIGH (ref 0.00–0.07)
Basophils Absolute: 0.1 10*3/uL (ref 0.0–0.1)
Basophils Relative: 0 %
Eosinophils Absolute: 0.1 10*3/uL (ref 0.0–0.5)
Eosinophils Relative: 1 %
HCT: 43.6 % (ref 39.0–52.0)
Hemoglobin: 14.2 g/dL (ref 13.0–17.0)
Immature Granulocytes: 1 %
Lymphocytes Relative: 9 %
Lymphs Abs: 1.2 10*3/uL (ref 0.7–4.0)
MCH: 28.6 pg (ref 26.0–34.0)
MCHC: 32.6 g/dL (ref 30.0–36.0)
MCV: 87.9 fL (ref 80.0–100.0)
Monocytes Absolute: 1.3 10*3/uL — ABNORMAL HIGH (ref 0.1–1.0)
Monocytes Relative: 11 %
Neutro Abs: 10 10*3/uL — ABNORMAL HIGH (ref 1.7–7.7)
Neutrophils Relative %: 78 %
Platelets: 240 10*3/uL (ref 150–400)
RBC: 4.96 MIL/uL (ref 4.22–5.81)
RDW: 13.2 % (ref 11.5–15.5)
WBC: 12.8 10*3/uL — ABNORMAL HIGH (ref 4.0–10.5)
nRBC: 0 % (ref 0.0–0.2)

## 2023-08-06 LAB — BASIC METABOLIC PANEL
Anion gap: 10 (ref 5–15)
BUN: 17 mg/dL (ref 8–23)
CO2: 24 mmol/L (ref 22–32)
Calcium: 8.4 mg/dL — ABNORMAL LOW (ref 8.9–10.3)
Chloride: 102 mmol/L (ref 98–111)
Creatinine, Ser: 1.39 mg/dL — ABNORMAL HIGH (ref 0.61–1.24)
GFR, Estimated: 55 mL/min — ABNORMAL LOW (ref >60)
Glucose, Bld: 103 mg/dL — ABNORMAL HIGH (ref 70–99)
Potassium: 4 mmol/L (ref 3.5–5.1)
Sodium: 136 mmol/L (ref 135–145)

## 2023-08-06 LAB — LACTIC ACID, PLASMA
Lactic Acid, Venous: 1 mmol/L (ref 0.5–1.9)
Lactic Acid, Venous: 1 mmol/L (ref 0.5–1.9)

## 2023-08-06 LAB — HIV ANTIBODY (ROUTINE TESTING W REFLEX): HIV Screen 4th Generation wRfx: NONREACTIVE

## 2023-08-06 MED ORDER — ALPRAZOLAM 0.5 MG PO TABS
0.5000 mg | ORAL_TABLET | Freq: Every evening | ORAL | Status: DC | PRN
  Administered 2023-08-08 – 2023-08-13 (×2): 0.5 mg via ORAL
  Filled 2023-08-06 (×2): qty 1

## 2023-08-06 MED ORDER — CYCLOBENZAPRINE HCL 10 MG PO TABS
10.0000 mg | ORAL_TABLET | Freq: Three times a day (TID) | ORAL | Status: DC | PRN
  Administered 2023-08-06 – 2023-08-18 (×10): 10 mg via ORAL
  Filled 2023-08-06 (×10): qty 1

## 2023-08-06 MED ORDER — APIXABAN 5 MG PO TABS: 5.0000 mg | ORAL_TABLET | Freq: Two times a day (BID) | ORAL | Status: DC

## 2023-08-06 MED ORDER — VANCOMYCIN HCL 2000 MG/400ML IV SOLN
2000.0000 mg | Freq: Once | INTRAVENOUS | Status: AC
  Administered 2023-08-06: 2000 mg via INTRAVENOUS
  Filled 2023-08-06: qty 400

## 2023-08-06 MED ORDER — GABAPENTIN 300 MG PO CAPS
300.0000 mg | ORAL_CAPSULE | Freq: Two times a day (BID) | ORAL | Status: DC
  Administered 2023-08-06 – 2023-08-21 (×29): 300 mg via ORAL
  Filled 2023-08-06 (×29): qty 1

## 2023-08-06 MED ORDER — ACETAMINOPHEN 325 MG PO TABS
650.0000 mg | ORAL_TABLET | Freq: Four times a day (QID) | ORAL | Status: DC | PRN
  Administered 2023-08-06: 650 mg via ORAL
  Filled 2023-08-06: qty 2

## 2023-08-06 MED ORDER — ONDANSETRON HCL 4 MG/2ML IJ SOLN: 4.0000 mg | Freq: Four times a day (QID) | INTRAMUSCULAR | Status: DC | PRN

## 2023-08-06 MED ORDER — ENOXAPARIN SODIUM 40 MG/0.4ML IJ SOSY: 40.0000 mg | PREFILLED_SYRINGE | INTRAMUSCULAR | Status: DC

## 2023-08-06 MED ORDER — AMLODIPINE BESYLATE 10 MG PO TABS
10.0000 mg | ORAL_TABLET | Freq: Every day | ORAL | Status: DC
  Administered 2023-08-06 – 2023-08-21 (×14): 10 mg via ORAL
  Filled 2023-08-06 (×15): qty 1

## 2023-08-06 MED ORDER — HYDRALAZINE HCL 20 MG/ML IJ SOLN: 5.0000 mg | Freq: Four times a day (QID) | INTRAMUSCULAR | Status: DC | PRN

## 2023-08-06 MED ORDER — ALPRAZOLAM 0.5 MG PO TABS: 0.5000 mg | ORAL_TABLET | Freq: Three times a day (TID) | ORAL | Status: DC | PRN

## 2023-08-06 MED ORDER — HYDROMORPHONE HCL 1 MG/ML IJ SOLN
1.0000 mg | Freq: Once | INTRAMUSCULAR | Status: AC
  Administered 2023-08-06: 1 mg via INTRAVENOUS
  Filled 2023-08-06: qty 1

## 2023-08-06 MED ORDER — VANCOMYCIN HCL 500 MG/100ML IV SOLN
500.0000 mg | Freq: Once | INTRAVENOUS | Status: AC
  Administered 2023-08-06: 500 mg via INTRAVENOUS
  Filled 2023-08-06: qty 100

## 2023-08-06 MED ORDER — ZOLPIDEM TARTRATE 5 MG PO TABS
5.0000 mg | ORAL_TABLET | Freq: Every day | ORAL | Status: DC
  Administered 2023-08-06 – 2023-08-20 (×15): 5 mg via ORAL
  Filled 2023-08-06 (×15): qty 1

## 2023-08-06 MED ORDER — ONDANSETRON HCL 4 MG PO TABS: 4.0000 mg | ORAL_TABLET | Freq: Four times a day (QID) | ORAL | Status: DC | PRN

## 2023-08-06 MED ORDER — INFLUENZA VAC A&B SURF ANT ADJ 0.5 ML IM SUSY
0.5000 mL | PREFILLED_SYRINGE | INTRAMUSCULAR | Status: AC
  Administered 2023-08-07: 0.5 mL via INTRAMUSCULAR
  Filled 2023-08-06: qty 0.5

## 2023-08-06 MED ORDER — CEFAZOLIN SODIUM-DEXTROSE 2-4 GM/100ML-% IV SOLN
2.0000 g | Freq: Three times a day (TID) | INTRAVENOUS | Status: AC
  Administered 2023-08-06 – 2023-08-07 (×4): 2 g via INTRAVENOUS
  Filled 2023-08-06 (×5): qty 100

## 2023-08-06 MED ORDER — ACETAMINOPHEN 650 MG RE SUPP: 650.0000 mg | Freq: Four times a day (QID) | RECTAL | Status: DC | PRN

## 2023-08-06 MED ORDER — TRAZODONE HCL 50 MG PO TABS: 25.0000 mg | ORAL_TABLET | Freq: Every evening | ORAL | Status: DC | PRN

## 2023-08-06 MED ORDER — HYDROCODONE-ACETAMINOPHEN 5-325 MG PO TABS
1.0000 | ORAL_TABLET | Freq: Three times a day (TID) | ORAL | Status: DC | PRN
  Administered 2023-08-06 – 2023-08-11 (×13): 1 via ORAL
  Filled 2023-08-06 (×13): qty 1

## 2023-08-06 MED ORDER — ALBUTEROL SULFATE (2.5 MG/3ML) 0.083% IN NEBU: 2.5000 mg | INHALATION_SOLUTION | RESPIRATORY_TRACT | Status: DC | PRN

## 2023-08-06 MED ORDER — HYDROCODONE-ACETAMINOPHEN 5-325 MG PO TABS
1.0000 | ORAL_TABLET | Freq: Once | ORAL | Status: AC
  Administered 2023-08-06: 1 via ORAL
  Filled 2023-08-06: qty 1

## 2023-08-06 MED ORDER — APIXABAN 5 MG PO TABS: 10.0000 mg | ORAL_TABLET | Freq: Two times a day (BID) | ORAL | Status: DC

## 2023-08-06 MED ORDER — APIXABAN 5 MG PO TABS
10.0000 mg | ORAL_TABLET | Freq: Two times a day (BID) | ORAL | Status: DC
  Administered 2023-08-06 – 2023-08-08 (×4): 10 mg via ORAL
  Filled 2023-08-06 (×4): qty 2

## 2023-08-06 NOTE — ED Triage Notes (Signed)
Pt coming from home. Sores on bottom L foot. Tenderness and swelling. Started about a week ago. Reports multiple falls recently. L and right leg pain. L leg pain can't put weight on. R leg just started after arriving in ED.

## 2023-08-06 NOTE — Progress Notes (Signed)
A consult was received from an ED physician for vancomycin per pharmacy dosing.  The patient's profile has been reviewed for ht/wt/allergies/indication/available labs.    A one time order has been placed for vancomycin 2000 mg + vancomycin 500 mg to make total dose = 2500 mg.   Further antibiotics/pharmacy consults should be ordered by admitting physician if indicated.                       Thank you,  Herby Abraham, Pharm.D Use secure chat for questions 08/06/2023 2:47 PM

## 2023-08-06 NOTE — H&P (Signed)
History and Physical  Tanner Phillips WFU:[id number] DOB: 1956-04-01 DOA: 08/06/2023  PCP: Ralene Ok, MD   Chief Complaint: Left leg redness, pain  HPI: Tanner Phillips is a 67 y.o. male with medical history significant for hypertension, slipped cervical disc, CAD status post MI being admitted to the hospital with left lower extremity cellulitis.  History is provided by the patient and his wife, who state that they initially noticed some cracks in the thick skin of his heel, which progressed to redness, and later swelling of the left lower extremity foot and calf.  Currently his left leg is slightly more swollen than the right, he has some calf tenderness.  Had some subjective chills.  Did not have any measured fevers.  He has a known history of a slipped disc he says, but it does not typically cause him weakness or pain in any of his extremities.  He went to urgent care about 3 days ago, was started on p.o. doxycycline for concern for left lower extremity cellulitis.  Says this failed to improve, so he came to the ER for evaluation.  In the emergency department, he was tachycardic but afebrile.  Blood pressure is elevated and stable.  Lab work significant for WBC 12.8.  Creatinine 1.39.  He was given a dose of IV vancomycin and admitted to the hospitalist service.  Review of Systems: Please see HPI for pertinent positives and negatives. A complete 10 system review of systems are otherwise negative.  Past Medical History:  Diagnosis Date   Chest pain    History of Bell's palsy    Knee pain    Myocardial infarction (HCC)    14-15 yrs. ago   Obesity    Slipped cervical disc    SOB (shortness of breath)    Past Surgical History:  Procedure Laterality Date   BACK SURGERY     LOWER   CHOLECYSTECTOMY     COLONOSCOPY WITH PROPOFOL N/A 06/03/2015   Procedure: COLONOSCOPY WITH PROPOFOL;  Surgeon: Charna Elizabeth, MD;  Location: [facility];  Service: Endoscopy;  Laterality: N/A;   COLONOSCOPY WITH  PROPOFOL N/A 07/27/2020   Procedure: COLONOSCOPY WITH PROPOFOL;  Surgeon: Charna Elizabeth, MD;  Location: [facility];  Service: Endoscopy;  Laterality: N/A;   ESOPHAGOGASTRODUODENOSCOPY (EGD) WITH PROPOFOL N/A 07/27/2020   Procedure: ESOPHAGOGASTRODUODENOSCOPY (EGD) WITH PROPOFOL;  Surgeon: Charna Elizabeth, MD;  Location: [facility];  Service: Endoscopy;  Laterality: N/A;   Left Shoulder Surgery     Right Shoulder Surgery      Social History:  reports that he has never smoked. He has never used smokeless tobacco. He reports current alcohol use. He reports that he does not use drugs.   No Known Allergies  No family history on file.   Prior to Admission medications   Medication Sig Start Date End Date Taking? Authorizing Provider  ALPRAZolam Prudy Feeler) 0.5 MG tablet Take 0.5 mg by mouth 3 (three) times daily as needed for anxiety.   Yes [provider]  amLODipine (NORVASC) 10 MG tablet Take 10 mg by mouth daily.   Yes [provider]  clindamycin (CLEOCIN) 300 MG capsule Take 300 mg by mouth 3 (three) times daily. 08/04/23  Yes [provider]  cyclobenzaprine (FLEXERIL) 10 MG tablet Take 10 mg by mouth as needed for muscle spasms. 06/22/23  Yes [provider]  gabapentin (NEURONTIN) 300 MG capsule Take 300 mg by mouth 2 (two) times daily.   Yes [provider]  HYDROcodone-acetaminophen (NORCO/VICODIN) 5-325  MG tablet Take 1 tablet by mouth 3 (three) times daily as needed for moderate pain (pain score 4-6) or severe pain (pain score 7-10). 08/02/23  Yes [provider]  ibuprofen (ADVIL) 200 MG tablet Take 600 mg by mouth as needed for mild pain (pain score 1-3) or moderate pain (pain score 4-6).   Yes [provider]  traMADol (ULTRAM) 50 MG tablet Take 50 mg by mouth as needed for moderate pain (pain score 4-6) or severe pain (pain score 7-10).   Yes [provider]  zolpidem (AMBIEN) 10 MG tablet Take 10 mg by mouth at  bedtime.   Yes [provider]  predniSONE (DELTASONE) 10 MG tablet Take by mouth. Patient not taking: Reported on 08/06/2023 07/10/23   [provider]    Physical Exam: BP 129/69 (BP Location: Right Arm)   Pulse 64   Temp 99.2 F (37.3 C)   Resp 17   Ht 5\' 5"  (1.651 m)   Wt 120.7 kg   SpO2 91%   BMI 44.26 kg/m   General:  Alert, oriented, calm, in no acute distress, resting comfortably, looks nontoxic, his wife is at the bedside Eyes: EOMI, clear conjuctivae, white sclerea Neck: supple, no masses, trachea mildline  Cardiovascular: RRR, no murmurs or rubs, he has asymmetrical nonpitting edema, left lower extremity greater than the right. Respiratory: clear to auscultation bilaterally, no wheezes, no crackles  Abdomen: soft, nontender, nondistended, normal bowel tones heard  Skin: dry, erythematous and warm skin changes overlying the left heel, dorsum of the foot, and inside part of his calf, no open skin lesions, areas of drainage Musculoskeletal: no joint effusions, normal range of motion, he has tenderness in the left calf Psychiatric: appropriate affect, normal speech  Neurologic: extraocular muscles intact, clear speech, moving all extremities with intact sensorium         Labs on Admission:  Basic Metabolic Panel: Recent Labs  Lab 08/06/23 0927  NA 136  K 4.0  CL 102  CO2 24  GLUCOSE 103*  BUN 17  CREATININE 1.39*  CALCIUM 8.4*   Liver Function Tests: No results for input(s): "AST", "ALT", "ALKPHOS", "BILITOT", "PROT", "ALBUMIN" in the last 168 hours. No results for input(s): "LIPASE", "AMYLASE" in the last 168 hours. No results for input(s): "AMMONIA" in the last 168 hours. CBC: Recent Labs  Lab 08/06/23 0927  WBC 12.8*  NEUTROABS 10.0*  HGB 14.2  HCT 43.6  MCV 87.9  PLT 240   Cardiac Enzymes: No results for input(s): "CKTOTAL", "CKMB", "CKMBINDEX", "TROPONINI" in the last 168 hours.  BNP (last 3 results) No results for input(s):  "BNP" in the last 8760 hours.  ProBNP (last 3 results) No results for input(s): "PROBNP" in the last 8760 hours.  CBG: No results for input(s): "GLUCAP" in the last 168 hours.  Radiological Exams on Admission: CT Hip Left Wo Contrast  Result Date: 08/06/2023 CLINICAL DATA:  History of falls.  Left leg pain. EXAM: CT OF THE LEFT HIP WITHOUT CONTRAST TECHNIQUE: Multidetector CT imaging of the left hip was performed according to the standard protocol. Multiplanar CT image reconstructions were also generated. RADIATION DOSE REDUCTION: This exam was performed according to the departmental dose-optimization program which includes automated exposure control, adjustment of the mA and/or kV according to patient size and/or use of iterative reconstruction technique. COMPARISON:  CT abdomen/pelvis dated November 21, 2022. FINDINGS: Bones/Joint/Cartilage No acute osseous abnormality. The left femoral head is seated within the acetabulum. Degenerative changes of the left  hip manifested by joint space narrowing superiorly and marginal osteophytosis. A tiny well corticated ossicle adjacent to the anterior superior acetabular rim may represent an os acetabula. No significant joint effusion identified. The pubic symphysis is anatomically aligned with degenerative changes. Ligaments Ligaments are suboptimally evaluated by CT. Muscles and Tendons No intramuscular fluid collection or hematoma. Soft tissue No fluid collection or hematoma. No enlarged lymph nodes identified in the field of view. IMPRESSION: 1. No acute osseous abnormality. 2. Osteoarthritis of the left hip. Electronically Signed   By: Hart Robinsons M.D.   On: 08/06/2023 14:52   DG HIP UNILAT WITH PELVIS 1V LEFT  Result Date: 08/06/2023 CLINICAL DATA:  Tenderness and swelling in left hip. EXAM: DG HIP (WITH OR WITHOUT PELVIS) 1V*L* COMPARISON:  None Available. FINDINGS: There is loss of definition of the cortex of the medial aspect of the right  superior pubic ramus (marked with electronic arrow sign). This may be artifactual however, correlate clinically for point tenderness to determine the need for additional imaging with pelvic CT scan. Otherwise, no acute fracture or dislocation. No aggressive osseous lesion. Visualized sacral arcuate lines are unremarkable. There are changes of chronic pubic symphisitis. There are mild degenerative changes of bilateral hip joints without significant joint space narrowing. Osteophytosis of the superior acetabulum. No radiopaque foreign bodies. IMPRESSION: *There is loss of definition of the cortex of the medial aspect of the right superior pubic ramus. This may be artifactual; however, correlate clinically for point tenderness to determine the need for additional imaging with pelvic CT scan, which is more sensitive for detection of undisplaced fractures. Otherwise, no acute osseous abnormality. Electronically Signed   By: Jules Schick M.D.   On: 08/06/2023 10:26   DG Ankle Complete Left  Result Date: 08/06/2023 CLINICAL DATA:  Tenderness and swelling in left ankle. EXAM: LEFT ANKLE COMPLETE - 3+ VIEW COMPARISON:  None Available. FINDINGS: Bone mineralization within normal limits for patient's age. No acute fracture or dislocation. No aggressive osseous lesion. Ankle mortise appears intact.  No significant degenerative changes. Calcaneal spur noted along the Achilles tendon attachment site. No focal soft tissue swelling. No radiopaque foreign bodies. IMPRESSION: *No acute osseous abnormality of the left ankle joint. Electronically Signed   By: Jules Schick M.D.   On: 08/06/2023 10:21    Assessment/Plan Tanner Phillips is a 67 y.o. male with medical history significant for hypertension, slipped cervical disc, CAD status post MI being admitted to the hospital with sepsis due to left lower extremity cellulitis.   Sepsis-meeting criteria with tachycardia, leukocytosis, source is left lower extremity cellulitis.   Hemodynamically stable.  Left ankle x-ray with no evidence of deeper infection. -Observation admission -Empiric IV Ancef -Check lactic acid level  Left lower extremity swelling and tenderness-could be due to cellulitis, treating as above.  Follow-up lower extremity Doppler to rule out PE.  Renal insufficiency-insufficient data points, but this may be stable CKD -Will follow renal function with daily labs  Hypertension-amlodipine 10 mg p.o. daily  Cervical radiculopathy-continue Flexeril  Insomnia-Ambien as needed  DVT prophylaxis: Lovenox     Code Status: Full Code  Consults called: None  Admission status: Observation  Time spent: 59 minutes  Kammy Klett Sharlette Dense MD [facility] Pager [phone]  If 7PM-7AM, please contact night-coverage [email or web address] Password [email or web address]  08/06/2023, 2:56 PM

## 2023-08-06 NOTE — Progress Notes (Signed)
PHARMACY - ANTICOAGULATION CONSULT NOTE  Pharmacy Consult for Eliquis Indication: DVT  No Known Allergies  Patient Measurements: Height: 5\' 5"  (165.1 cm) Weight: 120.7 kg (266 lb) IBW/kg (Calculated) : 61.5 Heparin Dosing Weight:   Vital Signs: Temp: 98.2 F (36.8 C) (10/28 1703) Temp Source: Oral (10/28 1531) BP: 162/101 (10/28 1703) Pulse Rate: 52 (10/28 1703)  Labs: Recent Labs    08/06/23 0927  HGB 14.2  HCT 43.6  PLT 240  CREATININE 1.39*    Estimated Creatinine Clearance: 60.4 mL/min (A) (by C-G formula based on SCr of 1.39 mg/dL (H)).   Medical History: Past Medical History:  Diagnosis Date   Chest pain    History of Jeniece Hannis's palsy    Knee pain    Myocardial infarction (HCC)    14-15 yrs. ago   Obesity    Slipped cervical disc    SOB (shortness of breath)     Medications:  Medications Prior to Admission  Medication Sig Dispense Refill Last Dose   ALPRAZolam (XANAX) 0.5 MG tablet Take 0.5 mg by mouth 3 (three) times daily as needed for anxiety.   08/04/2023   amLODipine (NORVASC) 10 MG tablet Take 10 mg by mouth daily.   08/05/2023   clindamycin (CLEOCIN) 300 MG capsule Take 300 mg by mouth 3 (three) times daily.   08/05/2023   cyclobenzaprine (FLEXERIL) 10 MG tablet Take 10 mg by mouth as needed for muscle spasms.   unk   gabapentin (NEURONTIN) 300 MG capsule Take 300 mg by mouth 2 (two) times daily.   08/05/2023   HYDROcodone-acetaminophen (NORCO/VICODIN) 5-325 MG tablet Take 1 tablet by mouth 3 (three) times daily as needed for moderate pain (pain score 4-6) or severe pain (pain score 7-10).   08/05/2023   ibuprofen (ADVIL) 200 MG tablet Take 600 mg by mouth as needed for mild pain (pain score 1-3) or moderate pain (pain score 4-6).   Past Week   traMADol (ULTRAM) 50 MG tablet Take 50 mg by mouth as needed for moderate pain (pain score 4-6) or severe pain (pain score 7-10).   unk   zolpidem (AMBIEN) 10 MG tablet Take 10 mg by mouth at bedtime.    08/05/2023   predniSONE (DELTASONE) 10 MG tablet Take by mouth. (Patient not taking: Reported on 08/06/2023)   Not Taking    Assessment: 67 yo M with new R DVT.  Pharmacy consulted to dose Eliquis.  CBC WNL. SCr 1.39, CrCl 60 ml/min No bleeding reported   Plan:  Eliquis 10 mg po bid x 7 days followed by Eliquis 5 mg po bid Will educate & provide 30 day free card prior to discharge.  Herby Abraham, Pharm.D Use secure chat for questions 08/06/2023 6:08 PM  Addendum: Clot originally reported on the R LE. Report has been corrected to L LE  Herby Abraham, Pharm.D Use secure chat for questions 08/07/2023 9:42 AM

## 2023-08-06 NOTE — ED Provider Notes (Signed)
[facility] Provider Note   CSN: [id number] Arrival date & time: 08/06/23  0818     History  Chief Complaint  Patient presents with  . Foot Pain  . Leg Pain    Tanner Phillips is a 67 y.o. male presenting with complaint of pain and redness in his left lower extremity.  Patient does suffer from plantar fasciitis demonstrated in the past by podiatrist but does not have persistent foot pain per his report.  He he says about 2 weeks ago began having worsening pain in his lower back radiating to his left leg.  He sees [facility] and says he had an MRI and then was referred to see a neurosurgeon, Dr. Lovell Sheehan, for a scheduled injection into his lower back in 2 weeks.  He reports he has a pain rating down his back and his left leg.  However he and his wife are concerned because he noticed redness and warmth of the left lower extremity from the toes up below the left knee, developing over the past several days.  He has some skin breakdown over the dry skin of his left heel.  He was started on doxycycline 2 days ago by his PCP and has been taking this medication.  He is still reporting significant pain, says it cannot bear any weight on his left leg.  He denies history of diabetes or neuropathy.  HPI     Home Medications Prior to Admission medications   Medication Sig Start Date End Date Taking? Authorizing Provider  amLODipine (NORVASC) 5 MG tablet Take 5 mg by mouth daily. 02/14/20   [provider]  fexofenadine (ALLEGRA) 180 MG tablet Take 180 mg by mouth daily. Patient not taking: Reported on 07/19/2020    [provider]  guaiFENesin (MUCINEX) 600 MG 12 hr tablet Take 600 mg by mouth 2 (two) times daily as needed for cough or to loosen phlegm.    [provider]  omeprazole (PRILOSEC) 40 MG capsule Take 40 mg by mouth every morning. 05/19/20   [provider]  zolpidem (AMBIEN) 10 MG tablet Take 10 mg by mouth at  bedtime.     [provider]      Allergies    Patient has no known allergies.    Review of Systems   Review of Systems  Physical Exam Updated Vital Signs BP 129/69 (BP Location: Right Arm)   Pulse 64   Temp 99.2 F (37.3 C)   Resp 17   Ht 5\' 5"  (1.651 m)   Wt 120.7 kg   SpO2 91%   BMI 44.26 kg/m  Physical Exam Constitutional:      General: He is not in acute distress.    Appearance: He is obese.  HENT:     Head: Normocephalic and atraumatic.  Eyes:     Conjunctiva/sclera: Conjunctivae normal.     Pupils: Pupils are equal, round, and reactive to light.  Cardiovascular:     Rate and Rhythm: Normal rate and regular rhythm.     Comments: Equal pedal pulse and tibial pulse bilaterally via doppler Pulmonary:     Effort: Pulmonary effort is normal. No respiratory distress.  Abdominal:     General: There is no distension.     Tenderness: There is no abdominal tenderness.  Musculoskeletal:     Comments: Erythema and warmth of the left lower extremity from the toes to below the left knee, blanching erythema   Skin:  General: Skin is warm and dry.  Neurological:     General: No focal deficit present.     Mental Status: He is alert. Mental status is at baseline.     Comments: Positive straight leg test, left lower extremity pain reproducible with leg movement  Psychiatric:        Mood and Affect: Mood normal.        Behavior: Behavior normal.     ED Results / Procedures / Treatments   Labs (all labs ordered are listed, but only abnormal results are displayed) Labs Reviewed  BASIC METABOLIC PANEL - Abnormal; Notable for the following components:      Result Value   Glucose, Bld 103 (*)    Creatinine, Ser 1.39 (*)    Calcium 8.4 (*)    GFR, Estimated 55 (*)    All other components within normal limits  CBC WITH DIFFERENTIAL/PLATELET - Abnormal; Notable for the following components:   WBC 12.8 (*)    Neutro Abs 10.0 (*)    Monocytes Absolute 1.3 (*)     Abs Immature Granulocytes 0.15 (*)    All other components within normal limits    EKG None  Radiology DG HIP UNILAT WITH PELVIS 1V LEFT  Result Date: 08/06/2023 CLINICAL DATA:  Tenderness and swelling in left hip. EXAM: DG HIP (WITH OR WITHOUT PELVIS) 1V*L* COMPARISON:  None Available. FINDINGS: There is loss of definition of the cortex of the medial aspect of the right superior pubic ramus (marked with electronic arrow sign). This may be artifactual however, correlate clinically for point tenderness to determine the need for additional imaging with pelvic CT scan. Otherwise, no acute fracture or dislocation. No aggressive osseous lesion. Visualized sacral arcuate lines are unremarkable. There are changes of chronic pubic symphisitis. There are mild degenerative changes of bilateral hip joints without significant joint space narrowing. Osteophytosis of the superior acetabulum. No radiopaque foreign bodies. IMPRESSION: *There is loss of definition of the cortex of the medial aspect of the right superior pubic ramus. This may be artifactual; however, correlate clinically for point tenderness to determine the need for additional imaging with pelvic CT scan, which is more sensitive for detection of undisplaced fractures. Otherwise, no acute osseous abnormality. Electronically Signed   By: Jules Schick M.D.   On: 08/06/2023 10:26   DG Ankle Complete Left  Result Date: 08/06/2023 CLINICAL DATA:  Tenderness and swelling in left ankle. EXAM: LEFT ANKLE COMPLETE - 3+ VIEW COMPARISON:  None Available. FINDINGS: Bone mineralization within normal limits for patient's age. No acute fracture or dislocation. No aggressive osseous lesion. Ankle mortise appears intact.  No significant degenerative changes. Calcaneal spur noted along the Achilles tendon attachment site. No focal soft tissue swelling. No radiopaque foreign bodies. IMPRESSION: *No acute osseous abnormality of the left ankle joint. Electronically  Signed   By: Jules Schick M.D.   On: 08/06/2023 10:21    Procedures Procedures    Medications Ordered in ED Medications  HYDROmorphone (DILAUDID) injection 1 mg (1 mg Intravenous Given 08/06/23 0943)  HYDROcodone-acetaminophen (NORCO/VICODIN) 5-325 MG per tablet 1 tablet (1 tablet Oral Given 08/06/23 1400)    ED Course/ Medical Decision Making/ A&P Clinical Course as of 08/06/23 1442  Mon Aug 06, 2023  1441 Admitted to hospitalist who will be following up on CT and vascular DVT ultrasound imaging.  Pt and wife updated.   [MT]    Clinical Course User Index [MT] Marylan Glore, Kermit Balo, MD  Medical Decision Making Amount and/or Complexity of Data Reviewed Labs: ordered. Radiology: ordered.  Risk Prescription drug management. Decision regarding hospitalization.   Patient is here with lower extremity pain and redness.  Etiology may be multifactorial in this case.  Difficult to exclude an infection given that there is some skin breakdown of the posterior heel and the left lower extremity does appear more red than the right lower extremity.  This seems less likely venous stasis dermatitis.  Patient has been on antibiotics for 2 days now with no improvement.  We will check his white blood cell count here and consider additional or changing antibiotic regimen.  The patient report 2 or 3 falls over the past 2 weeks due to weakness in his left leg or inability to bear weight limited by pain.  X-ray of the hip and left ankle ordered to evaluate for occult fracture.  I suspect there may be some lumbar radiculopathy component as well in the L4-L5 distribution per his report and presentation.  External records and PDMP reviewed.  Patient is prescribed Norco, Xanax, gabapentin for pain.  Supplemental history provided by the patient's wife at bedside  X-ray imaging showed a potential small pelvic fracture.  CT imaging has been ordered to better clarify this  fracture and is pending at the time of admission.  DVT vascular ultrasound was also ordered and is pending at the time of admission.  I do not suspect cerulea dolens clinically, or vascular ischemia.  Doubt nec fasc infection.  Patient will be started on IV antibiotics admitted to the hospital for potential cellulitis.  The patient is in agreement this plan as well as his wife.        Final Clinical Impression(s) / ED Diagnoses Final diagnoses:  Left leg pain    Rx / DC Orders ED Discharge Orders     None         Terald Sleeper, MD 08/06/23 1422

## 2023-08-06 NOTE — Progress Notes (Signed)
Lower extremity venous duplex completed. Please see CV Procedures for preliminary results.  Initial findings reported to Latricia Heft, Paramedic.  Shona Simpson, RVT 08/06/23 3:00 PM

## 2023-08-06 NOTE — Progress Notes (Signed)
Doppler positive for R sided DVT.  Started on Eliquis per pharmacy consult.

## 2023-08-06 NOTE — ED Notes (Signed)
ED TO INPATIENT HANDOFF REPORT  ED Nurse Name and Phone #:  Mellody Dance  [phone]  S Name/Age/Gender Tanner Phillips 67 y.o. male Room/Bed: WA11/WA11  Code Status   Code Status: Full Code  Home/SNF/Other Home Patient oriented to: self, place, time, and situation Is this baseline? Yes   Triage Complete: Triage complete  Chief Complaint Cellulitis [L03.90]  Triage Note Pt coming from home. Sores on bottom L foot. Tenderness and swelling. Started about a week ago. Reports multiple falls recently. L and right leg pain. L leg pain can't put weight on. R leg just started after arriving in ED.    Allergies No Known Allergies  Level of Care/Admitting Diagnosis ED Disposition     ED Disposition  Admit   Condition  --   Comment  Hospital Area: [facility] [[id number]]  Level of Care: Med-Surg [16]  May place patient in observation at [facility] or [facility] if equivalent level of care is available:: Yes  Covid Evaluation: Asymptomatic - no recent exposure (last 10 days) testing not required  Diagnosis: Cellulitis [846962]  Admitting Physician: Maryln Gottron [[id number]]  Attending Physician: Kirby Crigler, MIR M [[id number]]          B Medical/Surgery History Past Medical History:  Diagnosis Date   Chest pain    History of Bell's palsy    Knee pain    Myocardial infarction (HCC)    14-15 yrs. ago   Obesity    Slipped cervical disc    SOB (shortness of breath)    Past Surgical History:  Procedure Laterality Date   BACK SURGERY     LOWER   CHOLECYSTECTOMY     COLONOSCOPY WITH PROPOFOL N/A 06/03/2015   Procedure: COLONOSCOPY WITH PROPOFOL;  Surgeon: Charna Elizabeth, MD;  Location: [facility];  Service: Endoscopy;  Laterality: N/A;   COLONOSCOPY WITH PROPOFOL N/A 07/27/2020   Procedure: COLONOSCOPY WITH PROPOFOL;  Surgeon: Charna Elizabeth, MD;  Location: [facility];  Service: Endoscopy;  Laterality: N/A;   ESOPHAGOGASTRODUODENOSCOPY (EGD) WITH PROPOFOL N/A  07/27/2020   Procedure: ESOPHAGOGASTRODUODENOSCOPY (EGD) WITH PROPOFOL;  Surgeon: Charna Elizabeth, MD;  Location: [facility];  Service: Endoscopy;  Laterality: N/A;   Left Shoulder Surgery     Right Shoulder Surgery       A IV Location/Drains/Wounds Patient Lines/Drains/Airways Status     Active Line/Drains/Airways     Name Placement date Placement time Site Days   Peripheral IV 08/06/23 20 G Anterior;Left;Proximal Forearm 08/06/23  0942  Forearm  less than 1            Intake/Output Last 24 hours No intake or output data in the 24 hours ending 08/06/23 1524  Labs/Imaging Results for orders placed or performed during the hospital encounter of 08/06/23 (from the past 48 hour(s))  Basic metabolic panel     Status: Abnormal   Collection Time: 08/06/23  9:27 AM  Result Value Ref Range   Sodium 136 135 - 145 mmol/L   Potassium 4.0 3.5 - 5.1 mmol/L   Chloride 102 98 - 111 mmol/L   CO2 24 22 - 32 mmol/L   Glucose, Bld 103 (H) 70 - 99 mg/dL    Comment: Glucose reference range applies only to samples taken after fasting for at least 8 hours.   BUN 17 8 - 23 mg/dL   Creatinine, Ser 2.44 (H) 0.61 - 1.24 mg/dL   Calcium 8.4 (L) 8.9 - 10.3 mg/dL   GFR, Estimated 55 (L) >60  mL/min    Comment: (NOTE) Calculated using the CKD-EPI Creatinine Equation (2021)    Anion gap 10 5 - 15    Comment: Performed at [facility], [address] [place], [place] [address]  CBC with Differential     Status: Abnormal   Collection Time: 08/06/23  9:27 AM  Result Value Ref Range   WBC 12.8 (H) 4.0 - 10.5 K/uL   RBC 4.96 4.22 - 5.81 MIL/uL   Hemoglobin 14.2 13.0 - 17.0 g/dL   HCT 60.4 54.0 - 98.1 %   MCV 87.9 80.0 - 100.0 fL   MCH 28.6 26.0 - 34.0 pg   MCHC 32.6 30.0 - 36.0 g/dL   RDW 19.1 47.8 - 29.5 %   Platelets 240 150 - 400 K/uL   nRBC 0.0 0.0 - 0.2 %   Neutrophils Relative % 78 %   Neutro Abs 10.0 (H) 1.7 - 7.7 K/uL   Lymphocytes Relative 9 %   Lymphs Abs 1.2 0.7 -  4.0 K/uL   Monocytes Relative 11 %   Monocytes Absolute 1.3 (H) 0.1 - 1.0 K/uL   Eosinophils Relative 1 %   Eosinophils Absolute 0.1 0.0 - 0.5 K/uL   Basophils Relative 0 %   Basophils Absolute 0.1 0.0 - 0.1 K/uL   Immature Granulocytes 1 %   Abs Immature Granulocytes 0.15 (H) 0.00 - 0.07 K/uL    Comment: Performed at [facility], [address] [place], [place] [address]   CT Hip Left Wo Contrast  Result Date: 08/06/2023 CLINICAL DATA:  History of falls.  Left leg pain. EXAM: CT OF THE LEFT HIP WITHOUT CONTRAST TECHNIQUE: Multidetector CT imaging of the left hip was performed according to the standard protocol. Multiplanar CT image reconstructions were also generated. RADIATION DOSE REDUCTION: This exam was performed according to the departmental dose-optimization program which includes automated exposure control, adjustment of the mA and/or kV according to patient size and/or use of iterative reconstruction technique. COMPARISON:  CT abdomen/pelvis dated November 21, 2022. FINDINGS: Bones/Joint/Cartilage No acute osseous abnormality. The left femoral head is seated within the acetabulum. Degenerative changes of the left hip manifested by joint space narrowing superiorly and marginal osteophytosis. A tiny well corticated ossicle adjacent to the anterior superior acetabular rim may represent an os acetabula. No significant joint effusion identified. The pubic symphysis is anatomically aligned with degenerative changes. Ligaments Ligaments are suboptimally evaluated by CT. Muscles and Tendons No intramuscular fluid collection or hematoma. Soft tissue No fluid collection or hematoma. No enlarged lymph nodes identified in the field of view. IMPRESSION: 1. No acute osseous abnormality. 2. Osteoarthritis of the left hip. Electronically Signed   By: Hart Robinsons M.D.   On: 08/06/2023 14:52   DG HIP UNILAT WITH PELVIS 1V LEFT  Result Date: 08/06/2023 CLINICAL DATA:  Tenderness  and swelling in left hip. EXAM: DG HIP (WITH OR WITHOUT PELVIS) 1V*L* COMPARISON:  None Available. FINDINGS: There is loss of definition of the cortex of the medial aspect of the right superior pubic ramus (marked with electronic arrow sign). This may be artifactual however, correlate clinically for point tenderness to determine the need for additional imaging with pelvic CT scan. Otherwise, no acute fracture or dislocation. No aggressive osseous lesion. Visualized sacral arcuate lines are unremarkable. There are changes of chronic pubic symphisitis. There are mild degenerative changes of bilateral hip joints without significant joint space narrowing. Osteophytosis of the superior acetabulum. No radiopaque foreign bodies. IMPRESSION: *There is loss of  definition of the cortex of the medial aspect of the right superior pubic ramus. This may be artifactual; however, correlate clinically for point tenderness to determine the need for additional imaging with pelvic CT scan, which is more sensitive for detection of undisplaced fractures. Otherwise, no acute osseous abnormality. Electronically Signed   By: Jules Schick M.D.   On: 08/06/2023 10:26   DG Ankle Complete Left  Result Date: 08/06/2023 CLINICAL DATA:  Tenderness and swelling in left ankle. EXAM: LEFT ANKLE COMPLETE - 3+ VIEW COMPARISON:  None Available. FINDINGS: Bone mineralization within normal limits for patient's age. No acute fracture or dislocation. No aggressive osseous lesion. Ankle mortise appears intact.  No significant degenerative changes. Calcaneal spur noted along the Achilles tendon attachment site. No focal soft tissue swelling. No radiopaque foreign bodies. IMPRESSION: *No acute osseous abnormality of the left ankle joint. Electronically Signed   By: Jules Schick M.D.   On: 08/06/2023 10:21    Pending Labs Unresulted Labs (From admission, onward)     Start     Ordered   08/07/23 0500  Basic metabolic panel  Tomorrow morning,   R         08/06/23 1455   08/07/23 0500  CBC  Tomorrow morning,   R        08/06/23 1455   08/06/23 1508  Lactic acid, plasma  (Lactic Acid)  STAT Now then every 3 hours,   R (with STAT occurrences)      08/06/23 1507   08/06/23 1454  HIV Antibody (routine testing w rflx)  (HIV Antibody (Routine testing w reflex) panel)  Once,   R        08/06/23 1455            Vitals/Pain Today's Vitals   08/06/23 0830 08/06/23 0833 08/06/23 1128 08/06/23 1400  BP: (!) 145/82  129/69   Pulse: (!) 106  64   Resp:   17   Temp:   99.2 F (37.3 C)   TempSrc:      SpO2: 97%  91%   Weight:  120.7 kg    Height:  5\' 5"  (1.651 m)    PainSc:  8   8     Isolation Precautions No active isolations  Medications Medications  vancomycin (VANCOREADY) IVPB 2000 mg/400 mL (has no administration in time range)    And  vancomycin (VANCOREADY) IVPB 500 mg/100 mL (has no administration in time range)  HYDROcodone-acetaminophen (NORCO/VICODIN) 5-325 MG per tablet 1 tablet (has no administration in time range)  amLODipine (NORVASC) tablet 10 mg (has no administration in time range)  zolpidem (AMBIEN) tablet 5 mg (has no administration in time range)  cyclobenzaprine (FLEXERIL) tablet 10 mg (has no administration in time range)  gabapentin (NEURONTIN) capsule 300 mg (has no administration in time range)  enoxaparin (LOVENOX) injection 40 mg (has no administration in time range)  acetaminophen (TYLENOL) tablet 650 mg (has no administration in time range)    Or  acetaminophen (TYLENOL) suppository 650 mg (has no administration in time range)  traZODone (DESYREL) tablet 25 mg (has no administration in time range)  ondansetron (ZOFRAN) tablet 4 mg (has no administration in time range)    Or  ondansetron (ZOFRAN) injection 4 mg (has no administration in time range)  albuterol (PROVENTIL) (2.5 MG/3ML) 0.083% nebulizer solution 2.5 mg (has no administration in time range)  hydrALAZINE (APRESOLINE) injection 5 mg (has  no administration in time range)  ceFAZolin (ANCEF) IVPB 2g/100 mL premix (  has no administration in time range)  ALPRAZolam Prudy Feeler) tablet 0.5 mg (has no administration in time range)  HYDROmorphone (DILAUDID) injection 1 mg (1 mg Intravenous Given 08/06/23 0943)  HYDROcodone-acetaminophen (NORCO/VICODIN) 5-325 MG per tablet 1 tablet (1 tablet Oral Given 08/06/23 1400)    Mobility walks     Focused Assessments    R Recommendations: See Admitting Provider Note  Report given to:   Additional Notes:

## 2023-08-06 NOTE — Discharge Instructions (Signed)
Information on my medicine - ELIQUIS (apixaban)  This medication education was reviewed with me or my healthcare representative as part of my discharge preparation.  Why was Eliquis prescribed for you? Eliquis was prescribed to treat blood clots that may have been found in the veins of your legs (deep vein thrombosis) or in your lungs (pulmonary embolism) and to reduce the risk of them occurring again.  What do You need to know about Eliquis ? The starting dose is 10 mg (two 5 mg tablets) taken TWICE daily for the FIRST SEVEN (7) DAYS, then on Monday 08/13/2023  the dose is reduced to ONE 5 mg tablet taken TWICE daily.  Eliquis may be taken with or without food.   Try to take the dose about the same time in the morning and in the evening. If you have difficulty swallowing the tablet whole please discuss with your pharmacist how to take the medication safely.  Take Eliquis exactly as prescribed and DO NOT stop taking Eliquis without talking to the doctor who prescribed the medication.  Stopping may increase your risk of developing a new blood clot.  Refill your prescription before you run out.  After discharge, you should have regular check-up appointments with your healthcare provider that is prescribing your Eliquis.    What do you do if you miss a dose? If a dose of ELIQUIS is not taken at the scheduled time, take it as soon as possible on the same day and twice-daily administration should be resumed. The dose should not be doubled to make up for a missed dose.  Important Safety Information A possible side effect of Eliquis is bleeding. You should call your healthcare provider right away if you experience any of the following: Bleeding from an injury or your nose that does not stop. Unusual colored urine (red or dark brown) or unusual colored stools (red or black). Unusual bruising for unknown reasons. A serious fall or if you hit your head (even if there is no bleeding).  Some  medicines may interact with Eliquis and might increase your risk of bleeding or clotting while on Eliquis. To help avoid this, consult your healthcare provider or pharmacist prior to using any new prescription or non-prescription medications, including herbals, vitamins, non-steroidal anti-inflammatory drugs (NSAIDs) and supplements.  This website has more information on Eliquis (apixaban): http://www.eliquis.com/eliquis/home

## 2023-08-07 DIAGNOSIS — M5410 Radiculopathy, site unspecified: Secondary | ICD-10-CM | POA: Diagnosis not present

## 2023-08-07 DIAGNOSIS — I82409 Acute embolism and thrombosis of unspecified deep veins of unspecified lower extremity: Secondary | ICD-10-CM | POA: Diagnosis present

## 2023-08-07 DIAGNOSIS — I10 Essential (primary) hypertension: Secondary | ICD-10-CM | POA: Diagnosis not present

## 2023-08-07 DIAGNOSIS — I82412 Acute embolism and thrombosis of left femoral vein: Secondary | ICD-10-CM

## 2023-08-07 DIAGNOSIS — N289 Disorder of kidney and ureter, unspecified: Secondary | ICD-10-CM

## 2023-08-07 DIAGNOSIS — L03116 Cellulitis of left lower limb: Secondary | ICD-10-CM | POA: Diagnosis not present

## 2023-08-07 LAB — BASIC METABOLIC PANEL
Anion gap: 11 (ref 5–15)
BUN: 17 mg/dL (ref 8–23)
CO2: 22 mmol/L (ref 22–32)
Calcium: 8.4 mg/dL — ABNORMAL LOW (ref 8.9–10.3)
Chloride: 103 mmol/L (ref 98–111)
Creatinine, Ser: 1.42 mg/dL — ABNORMAL HIGH (ref 0.61–1.24)
GFR, Estimated: 53 mL/min — ABNORMAL LOW (ref >60)
Glucose, Bld: 96 mg/dL (ref 70–99)
Potassium: 3.8 mmol/L (ref 3.5–5.1)
Sodium: 136 mmol/L (ref 135–145)

## 2023-08-07 LAB — CBC
HCT: 44.1 % (ref 39.0–52.0)
Hemoglobin: 14.1 g/dL (ref 13.0–17.0)
MCH: 28.3 pg (ref 26.0–34.0)
MCHC: 32 g/dL (ref 30.0–36.0)
MCV: 88.6 fL (ref 80.0–100.0)
Platelets: 257 10*3/uL (ref 150–400)
RBC: 4.98 MIL/uL (ref 4.22–5.81)
RDW: 13.2 % (ref 11.5–15.5)
WBC: 12 10*3/uL — ABNORMAL HIGH (ref 4.0–10.5)
nRBC: 0 % (ref 0.0–0.2)

## 2023-08-07 MED ORDER — TRAZODONE HCL 50 MG PO TABS
25.0000 mg | ORAL_TABLET | Freq: Every evening | ORAL | Status: DC | PRN
Start: 2023-08-07 — End: 2023-08-21

## 2023-08-07 MED ORDER — CEFADROXIL 500 MG PO CAPS
500.0000 mg | ORAL_CAPSULE | Freq: Two times a day (BID) | ORAL | Status: AC
  Administered 2023-08-08 – 2023-08-10 (×6): 500 mg via ORAL
  Filled 2023-08-07 (×6): qty 1

## 2023-08-07 NOTE — Progress Notes (Signed)
[birth year]      Patient Gender: M Patient Age:   67 years Exam Location:  [facility] Procedure:      VAS [place] LOWER EXTREMITY VENOUS (DVT) Referring Phys: MATTHEW TRIFAN --------------------------------------------------------------------------------  Indications: Pain.  Risk Factors: Surgery Esophagogastroduodenoscopy 07/27/20 obesity. Comparison Study: No prior study Performing Technologist: Shona Simpson  Examination Guidelines: A complete evaluation includes B-mode imaging, spectral Doppler, color Doppler, and power Doppler as needed of all accessible portions of each vessel. Bilateral testing is considered an integral part of a complete examination. Limited examinations for reoccurring indications may be performed as noted. The reflux portion of the exam is performed with the patient in reverse Trendelenburg.  +-----+---------------+---------+-----------+----------+--------------+  RIGHTCompressibilityPhasicitySpontaneityPropertiesThrombus Aging +-----+---------------+---------+-----------+----------+--------------+ CFV  Full           Yes      Yes                                 +-----+---------------+---------+-----------+----------+--------------+   +---------+---------------+---------+-----------+---------------+-------------+ LEFT     CompressibilityPhasicitySpontaneityProperties     Thrombus                                                                 Aging         +---------+---------------+---------+-----------+---------------+-------------+ CFV      Full           Yes      Yes                                     +---------+---------------+---------+-----------+---------------+-------------+ SFJ      Full                                                            +---------+---------------+---------+-----------+---------------+-------------+ FV Prox  Partial                 Yes        rigid          Acute                                                     w/compression                +---------+---------------+---------+-----------+---------------+-------------+ FV Mid   None                    No         brightly       Acute                                                     echogenic                    +---------+---------------+---------+-----------+---------------+-------------+  PROGRESS NOTE    Tanner Phillips  [name] DOB: 10-14-1955 DOA: 08/05/1968 PCP: Ralene Ok, MD   Chief Complaint  Patient presents with   Foot Pain   Leg Pain    Brief Narrative: Patient 67 year old gentleman history of hypertension, slipped lumbar disc, CAD status post MI presented with worsening left lower extremity cellulitis.  Patient noted to have received some oral doxycycline well with no significant clinical improvement and worsening symptoms presented to the ED.  Patient on presentation noted to be tachycardic but afebrile noted to have a leukocytosis.  Patient received a dose of IV vancomycin and admitted for further evaluation and management.  Lower extremity Dopplers done positive for DVT in the left lower extremity.  Patient placed on anticoagulation and maintained on IV antibiotics.   Assessment & Plan:   Principal Problem:   DVT, lower extremity (HCC) Active Problems:   Cellulitis   Radiculopathy   Essential hypertension   Renal insufficiency   #1 left lower extremity DVT -Patient had presented with worsening left lower extremity pain, redness, edema with concerns for outpatient failure for left lower extremity cellulitis. -Lower extremity Dopplers done with findings consistent with acute DVT involving the left femoral vein, left popliteal vein, left posterior tibial veins, left peroneal veins.  Acute superficial vein thrombosis involving the left small saphenous veins noted.  Findings also consistent with acute intramuscular thrombosis involving the left gastrocnemius veins and left soleal veins. -Patient denies any prior history of DVTs, no family history of DVT, no recent surgeries, no recent long car rides. -Patient states inability to bear weight on the left lower extremity. -Patient started on Eliquis which we will continue. -Keep lower extremity elevated.  2.??  Left lower extremity cellulitis -Patient with concerns of worsening left lower extremity  cellulitis with warmth, swelling, pain with no significant improvement on oral doxycycline. -Symptoms likely secondary to left lower extremity DVT. -Patient status post dose of IV vancomycin on presentation to the ED. -Patient currently on IV Ancef which we will continue for the next 24 hours and transition to oral Duricef tomorrow for 3 more days to complete a 5-day course of treatment. -Keep lower extremity elevated.  3.  Sepsis ruled out -Patient initially presented meeting criteria for sepsis with tachycardia, leukocytosis with source felt to be likely left lower extremity cellulitis. -Plain films of the left ankle with no evidence of deep infection. -Patient's symptoms likely related to problem #1. -Patient currently on IV Ancef which we will continue empirically for now and transition to [facility] tomorrow to complete an empiric course of antibiotic treatment.  4.  Hypertension -Continue Norvasc 10 mg daily.  5.  Radiculopathy -Flexeril.  6.  Insomnia -Ambien as needed.  7.  Renal insufficiency -Creatinine currently stable. -Follow-up.   DVT prophylaxis: Eliquis Code Status: Full Family Communication: Updated patient and wife at bedside. Disposition: Likely home hopefully in the next 24 hours 08/08/2023 pending PT evaluation.  Status is: Observation The patient remains OBS appropriate and will d/c before 2 midnights.   Consultants:  None  Procedures:  CT left hip 08/06/2023 Plain films left ankle 08/06/2023 Lower extremity Dopplers 08/06/2023 Plain films of the left hip and pelvis 08/06/2023  Antimicrobials:  Anti-infectives (From admission, onward)    Start     Dose/Rate Route Frequency Ordered Stop   08/08/23 1000  cefadroxil (DURICEF) capsule 500 mg        500 mg Oral 2 times daily 08/07/23 1852 08/11/23 0959   08/06/23 1600  ceFAZolin (  PROGRESS NOTE    Tanner Phillips  [name] DOB: 10-14-1955 DOA: 08/05/1968 PCP: Ralene Ok, MD   Chief Complaint  Patient presents with   Foot Pain   Leg Pain    Brief Narrative: Patient 67 year old gentleman history of hypertension, slipped lumbar disc, CAD status post MI presented with worsening left lower extremity cellulitis.  Patient noted to have received some oral doxycycline well with no significant clinical improvement and worsening symptoms presented to the ED.  Patient on presentation noted to be tachycardic but afebrile noted to have a leukocytosis.  Patient received a dose of IV vancomycin and admitted for further evaluation and management.  Lower extremity Dopplers done positive for DVT in the left lower extremity.  Patient placed on anticoagulation and maintained on IV antibiotics.   Assessment & Plan:   Principal Problem:   DVT, lower extremity (HCC) Active Problems:   Cellulitis   Radiculopathy   Essential hypertension   Renal insufficiency   #1 left lower extremity DVT -Patient had presented with worsening left lower extremity pain, redness, edema with concerns for outpatient failure for left lower extremity cellulitis. -Lower extremity Dopplers done with findings consistent with acute DVT involving the left femoral vein, left popliteal vein, left posterior tibial veins, left peroneal veins.  Acute superficial vein thrombosis involving the left small saphenous veins noted.  Findings also consistent with acute intramuscular thrombosis involving the left gastrocnemius veins and left soleal veins. -Patient denies any prior history of DVTs, no family history of DVT, no recent surgeries, no recent long car rides. -Patient states inability to bear weight on the left lower extremity. -Patient started on Eliquis which we will continue. -Keep lower extremity elevated.  2.??  Left lower extremity cellulitis -Patient with concerns of worsening left lower extremity  cellulitis with warmth, swelling, pain with no significant improvement on oral doxycycline. -Symptoms likely secondary to left lower extremity DVT. -Patient status post dose of IV vancomycin on presentation to the ED. -Patient currently on IV Ancef which we will continue for the next 24 hours and transition to oral Duricef tomorrow for 3 more days to complete a 5-day course of treatment. -Keep lower extremity elevated.  3.  Sepsis ruled out -Patient initially presented meeting criteria for sepsis with tachycardia, leukocytosis with source felt to be likely left lower extremity cellulitis. -Plain films of the left ankle with no evidence of deep infection. -Patient's symptoms likely related to problem #1. -Patient currently on IV Ancef which we will continue empirically for now and transition to [facility] tomorrow to complete an empiric course of antibiotic treatment.  4.  Hypertension -Continue Norvasc 10 mg daily.  5.  Radiculopathy -Flexeril.  6.  Insomnia -Ambien as needed.  7.  Renal insufficiency -Creatinine currently stable. -Follow-up.   DVT prophylaxis: Eliquis Code Status: Full Family Communication: Updated patient and wife at bedside. Disposition: Likely home hopefully in the next 24 hours 08/08/2023 pending PT evaluation.  Status is: Observation The patient remains OBS appropriate and will d/c before 2 midnights.   Consultants:  None  Procedures:  CT left hip 08/06/2023 Plain films left ankle 08/06/2023 Lower extremity Dopplers 08/06/2023 Plain films of the left hip and pelvis 08/06/2023  Antimicrobials:  Anti-infectives (From admission, onward)    Start     Dose/Rate Route Frequency Ordered Stop   08/08/23 1000  cefadroxil (DURICEF) capsule 500 mg        500 mg Oral 2 times daily 08/07/23 1852 08/11/23 0959   08/06/23 1600  ceFAZolin (  [birth year]      Patient Gender: M Patient Age:   67 years Exam Location:  [facility] Procedure:      VAS [place] LOWER EXTREMITY VENOUS (DVT) Referring Phys: MATTHEW TRIFAN --------------------------------------------------------------------------------  Indications: Pain.  Risk Factors: Surgery Esophagogastroduodenoscopy 07/27/20 obesity. Comparison Study: No prior study Performing Technologist: Shona Simpson  Examination Guidelines: A complete evaluation includes B-mode imaging, spectral Doppler, color Doppler, and power Doppler as needed of all accessible portions of each vessel. Bilateral testing is considered an integral part of a complete examination. Limited examinations for reoccurring indications may be performed as noted. The reflux portion of the exam is performed with the patient in reverse Trendelenburg.  +-----+---------------+---------+-----------+----------+--------------+  RIGHTCompressibilityPhasicitySpontaneityPropertiesThrombus Aging +-----+---------------+---------+-----------+----------+--------------+ CFV  Full           Yes      Yes                                 +-----+---------------+---------+-----------+----------+--------------+   +---------+---------------+---------+-----------+---------------+-------------+ LEFT     CompressibilityPhasicitySpontaneityProperties     Thrombus                                                                 Aging         +---------+---------------+---------+-----------+---------------+-------------+ CFV      Full           Yes      Yes                                     +---------+---------------+---------+-----------+---------------+-------------+ SFJ      Full                                                            +---------+---------------+---------+-----------+---------------+-------------+ FV Prox  Partial                 Yes        rigid          Acute                                                     w/compression                +---------+---------------+---------+-----------+---------------+-------------+ FV Mid   None                    No         brightly       Acute                                                     echogenic                    +---------+---------------+---------+-----------+---------------+-------------+  PROGRESS NOTE    Tanner Phillips  [name] DOB: 10-14-1955 DOA: 08/05/1968 PCP: Ralene Ok, MD   Chief Complaint  Patient presents with   Foot Pain   Leg Pain    Brief Narrative: Patient 67 year old gentleman history of hypertension, slipped lumbar disc, CAD status post MI presented with worsening left lower extremity cellulitis.  Patient noted to have received some oral doxycycline well with no significant clinical improvement and worsening symptoms presented to the ED.  Patient on presentation noted to be tachycardic but afebrile noted to have a leukocytosis.  Patient received a dose of IV vancomycin and admitted for further evaluation and management.  Lower extremity Dopplers done positive for DVT in the left lower extremity.  Patient placed on anticoagulation and maintained on IV antibiotics.   Assessment & Plan:   Principal Problem:   DVT, lower extremity (HCC) Active Problems:   Cellulitis   Radiculopathy   Essential hypertension   Renal insufficiency   #1 left lower extremity DVT -Patient had presented with worsening left lower extremity pain, redness, edema with concerns for outpatient failure for left lower extremity cellulitis. -Lower extremity Dopplers done with findings consistent with acute DVT involving the left femoral vein, left popliteal vein, left posterior tibial veins, left peroneal veins.  Acute superficial vein thrombosis involving the left small saphenous veins noted.  Findings also consistent with acute intramuscular thrombosis involving the left gastrocnemius veins and left soleal veins. -Patient denies any prior history of DVTs, no family history of DVT, no recent surgeries, no recent long car rides. -Patient states inability to bear weight on the left lower extremity. -Patient started on Eliquis which we will continue. -Keep lower extremity elevated.  2.??  Left lower extremity cellulitis -Patient with concerns of worsening left lower extremity  cellulitis with warmth, swelling, pain with no significant improvement on oral doxycycline. -Symptoms likely secondary to left lower extremity DVT. -Patient status post dose of IV vancomycin on presentation to the ED. -Patient currently on IV Ancef which we will continue for the next 24 hours and transition to oral Duricef tomorrow for 3 more days to complete a 5-day course of treatment. -Keep lower extremity elevated.  3.  Sepsis ruled out -Patient initially presented meeting criteria for sepsis with tachycardia, leukocytosis with source felt to be likely left lower extremity cellulitis. -Plain films of the left ankle with no evidence of deep infection. -Patient's symptoms likely related to problem #1. -Patient currently on IV Ancef which we will continue empirically for now and transition to [facility] tomorrow to complete an empiric course of antibiotic treatment.  4.  Hypertension -Continue Norvasc 10 mg daily.  5.  Radiculopathy -Flexeril.  6.  Insomnia -Ambien as needed.  7.  Renal insufficiency -Creatinine currently stable. -Follow-up.   DVT prophylaxis: Eliquis Code Status: Full Family Communication: Updated patient and wife at bedside. Disposition: Likely home hopefully in the next 24 hours 08/08/2023 pending PT evaluation.  Status is: Observation The patient remains OBS appropriate and will d/c before 2 midnights.   Consultants:  None  Procedures:  CT left hip 08/06/2023 Plain films left ankle 08/06/2023 Lower extremity Dopplers 08/06/2023 Plain films of the left hip and pelvis 08/06/2023  Antimicrobials:  Anti-infectives (From admission, onward)    Start     Dose/Rate Route Frequency Ordered Stop   08/08/23 1000  cefadroxil (DURICEF) capsule 500 mg        500 mg Oral 2 times daily 08/07/23 1852 08/11/23 0959   08/06/23 1600  ceFAZolin (  PROGRESS NOTE    Tanner Phillips  [name] DOB: 10-14-1955 DOA: 08/05/1968 PCP: Ralene Ok, MD   Chief Complaint  Patient presents with   Foot Pain   Leg Pain    Brief Narrative: Patient 67 year old gentleman history of hypertension, slipped lumbar disc, CAD status post MI presented with worsening left lower extremity cellulitis.  Patient noted to have received some oral doxycycline well with no significant clinical improvement and worsening symptoms presented to the ED.  Patient on presentation noted to be tachycardic but afebrile noted to have a leukocytosis.  Patient received a dose of IV vancomycin and admitted for further evaluation and management.  Lower extremity Dopplers done positive for DVT in the left lower extremity.  Patient placed on anticoagulation and maintained on IV antibiotics.   Assessment & Plan:   Principal Problem:   DVT, lower extremity (HCC) Active Problems:   Cellulitis   Radiculopathy   Essential hypertension   Renal insufficiency   #1 left lower extremity DVT -Patient had presented with worsening left lower extremity pain, redness, edema with concerns for outpatient failure for left lower extremity cellulitis. -Lower extremity Dopplers done with findings consistent with acute DVT involving the left femoral vein, left popliteal vein, left posterior tibial veins, left peroneal veins.  Acute superficial vein thrombosis involving the left small saphenous veins noted.  Findings also consistent with acute intramuscular thrombosis involving the left gastrocnemius veins and left soleal veins. -Patient denies any prior history of DVTs, no family history of DVT, no recent surgeries, no recent long car rides. -Patient states inability to bear weight on the left lower extremity. -Patient started on Eliquis which we will continue. -Keep lower extremity elevated.  2.??  Left lower extremity cellulitis -Patient with concerns of worsening left lower extremity  cellulitis with warmth, swelling, pain with no significant improvement on oral doxycycline. -Symptoms likely secondary to left lower extremity DVT. -Patient status post dose of IV vancomycin on presentation to the ED. -Patient currently on IV Ancef which we will continue for the next 24 hours and transition to oral Duricef tomorrow for 3 more days to complete a 5-day course of treatment. -Keep lower extremity elevated.  3.  Sepsis ruled out -Patient initially presented meeting criteria for sepsis with tachycardia, leukocytosis with source felt to be likely left lower extremity cellulitis. -Plain films of the left ankle with no evidence of deep infection. -Patient's symptoms likely related to problem #1. -Patient currently on IV Ancef which we will continue empirically for now and transition to [facility] tomorrow to complete an empiric course of antibiotic treatment.  4.  Hypertension -Continue Norvasc 10 mg daily.  5.  Radiculopathy -Flexeril.  6.  Insomnia -Ambien as needed.  7.  Renal insufficiency -Creatinine currently stable. -Follow-up.   DVT prophylaxis: Eliquis Code Status: Full Family Communication: Updated patient and wife at bedside. Disposition: Likely home hopefully in the next 24 hours 08/08/2023 pending PT evaluation.  Status is: Observation The patient remains OBS appropriate and will d/c before 2 midnights.   Consultants:  None  Procedures:  CT left hip 08/06/2023 Plain films left ankle 08/06/2023 Lower extremity Dopplers 08/06/2023 Plain films of the left hip and pelvis 08/06/2023  Antimicrobials:  Anti-infectives (From admission, onward)    Start     Dose/Rate Route Frequency Ordered Stop   08/08/23 1000  cefadroxil (DURICEF) capsule 500 mg        500 mg Oral 2 times daily 08/07/23 1852 08/11/23 0959   08/06/23 1600  ceFAZolin (

## 2023-08-07 NOTE — TOC Initial Note (Signed)
Transition of Care [facility]) - Initial/Assessment Note    Patient Details  Name: Tanner Phillips MRN: [id number] Date of Birth: [birth year]  [facility] CM/SW Contact:    Harriett Sine, RN Phone Number: 08/07/2023, 1:06 PM  Clinical Narrative:                  Pt from home with spouse, pt has pcp, Spoke with pt at bedside about wound care, NCM role and d/c plans. No SDOH needs at this time, TOC following   Expected Discharge Plan: Home/Self Care Barriers to Discharge: No Barriers Identified   Patient Goals and CMS Choice Patient states their goals for this hospitalization and ongoing recovery are:: Vanita Panda get rid of sores CMS Medicare.gov Compare Post Acute Care list provided to::  (no recommendation) Choice offered to / list presented to :  (no recommendation) [place] ownership interest in [facility].provided to::  (none)    Expected Discharge Plan and Services In-house Referral: NA Discharge Planning Services:  (none) Post Acute Care Choice:  (none) Living arrangements for the past 2 months: Single Family Home                 DME Arranged:  (no recommendation)         HH Arranged:  (no recommendation)          Prior Living Arrangements/Services Living arrangements for the past 2 months: Single Family Home Lives with:: Spouse Patient language and need for interpreter reviewed:: Yes Do you feel safe going back to the place where you live?: Yes      Need for Family Participation in Patient Care: Yes (Comment) Care giver support system in place?: Yes (comment) Current home services:  (none)    Activities of Daily Living   ADL Screening (condition at time of admission) Independently performs ADLs?: No Does the patient have a NEW difficulty with bathing/dressing/toileting/self-feeding that is expected to last >3 days?: Yes (Initiates electronic notice to provider for possible OT consult) Does the patient have a NEW difficulty with getting in/out  of bed, walking, or climbing stairs that is expected to last >3 days?: Yes (Initiates electronic notice to provider for possible PT consult) Does the patient have a NEW difficulty with communication that is expected to last >3 days?: No Is the patient deaf or have difficulty hearing?: No Does the patient have difficulty seeing, even when wearing glasses/contacts?: No Does the patient have difficulty concentrating, remembering, or making decisions?: No  Permission Sought/Granted Permission sought to share information with : Family Supports Permission granted to share information with : Yes, Verbal Permission Granted              Emotional Assessment   Attitude/Demeanor/Rapport: Engaged Affect (typically observed): Calm Orientation: : Oriented to Self, Oriented to Place, Oriented to  Time, Oriented to Situation Alcohol / Substance Use: Alcohol Use Psych Involvement: No (comment)  Admission diagnosis:  Cellulitis [L03.90] Left leg pain [M79.605] Patient Active Problem List   Diagnosis Date Noted   DVT, lower extremity (HCC) 08/07/2023   Cellulitis 08/06/2023   MYOCARDIAL INFARCTION 07/21/2010   PULMONARY NODULE, 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 - [address] [facility] [place] [address] Phone: [phone] Fax: [phone]  [place] [facility] [address] [place] [address] Phone: [phone] Fax: [phone]     Social Determinants of  Health (SDOH) Social History: SDOH Screenings   Food Insecurity: No Food Insecurity (08/06/2023)  Housing: Patient Declined (08/06/2023)  Transportation Needs: No Transportation Needs (08/06/2023)  Utilities: Not At Risk (08/06/2023)  Tobacco Use: Low Risk  (08/06/2023)   SDOH Interventions:     Readmission Risk Interventions     No data to display

## 2023-08-07 NOTE — Consult Note (Addendum)
WOC Nurse Consult Note: Reason for Consult: Consult requested for bilat feet, left heel and left lower abd. Pt is currently on systemic antibiotics for cellulitis.  Pt states he previously had a wound to left lower abd but now it is pink and dry without an open wound or drainage, remaining erythremia is .2X.2cm Left heel with dark purple Deep tissue pressure injury; 4X3cm, painful to touch, no open wound, loose skin which will probably eventually peel. Currently there is no open wound requiring topical treatment and no drainage.  Pressure Injury POA: Yes Pt has 2 areas of full thickness wounds to left and right spaced between 4th and 5th toes; each is 50% red, 50% yellow, .5X.5X.2cm, mod amt yellow drainage. No odor or fluctuance.  Dressing procedure/placement/frequency: Topical treatment orders provided for bedside nurses to perform as follows:  1. Tuck piece of Aquacel in between 4th and 5th toe spaces Q day 2. Foam dressing to left heel, change Q 3 days or PRN soiling. Float heel to reduce pressure. Please re-consult if further assistance is needed.  Thank-you,  Cammie Mcgee MSN, RN, CWOCN, [place], CNS [phone]

## 2023-08-07 NOTE — Care Management Obs Status (Signed)
MEDICARE OBSERVATION STATUS NOTIFICATION   Patient Details  Name: Tanner Phillips MRN: [id number] Date of Birth: [birth year]   Medicare Observation Status Notification Given:       Harriett Sine, RN 08/07/2023, 3:56 PM

## 2023-08-08 ENCOUNTER — Observation Stay (HOSPITAL_BASED_OUTPATIENT_CLINIC_OR_DEPARTMENT_OTHER): Payer: Medicare Other

## 2023-08-08 DIAGNOSIS — I70249 Atherosclerosis of native arteries of left leg with ulceration of unspecified site: Secondary | ICD-10-CM | POA: Diagnosis not present

## 2023-08-08 DIAGNOSIS — I7025 Atherosclerosis of native arteries of other extremities with ulceration: Secondary | ICD-10-CM | POA: Diagnosis not present

## 2023-08-08 DIAGNOSIS — L97529 Non-pressure chronic ulcer of other part of left foot with unspecified severity: Secondary | ICD-10-CM | POA: Diagnosis present

## 2023-08-08 DIAGNOSIS — M79605 Pain in left leg: Secondary | ICD-10-CM | POA: Diagnosis not present

## 2023-08-08 DIAGNOSIS — L97509 Non-pressure chronic ulcer of other part of unspecified foot with unspecified severity: Secondary | ICD-10-CM | POA: Diagnosis not present

## 2023-08-08 DIAGNOSIS — I70213 Atherosclerosis of native arteries of extremities with intermittent claudication, bilateral legs: Secondary | ICD-10-CM

## 2023-08-08 DIAGNOSIS — I82452 Acute embolism and thrombosis of left peroneal vein: Secondary | ICD-10-CM | POA: Diagnosis present

## 2023-08-08 DIAGNOSIS — E785 Hyperlipidemia, unspecified: Secondary | ICD-10-CM | POA: Diagnosis present

## 2023-08-08 DIAGNOSIS — I771 Stricture of artery: Secondary | ICD-10-CM | POA: Diagnosis not present

## 2023-08-08 DIAGNOSIS — I743 Embolism and thrombosis of arteries of the lower extremities: Secondary | ICD-10-CM | POA: Diagnosis present

## 2023-08-08 DIAGNOSIS — L97919 Non-pressure chronic ulcer of unspecified part of right lower leg with unspecified severity: Secondary | ICD-10-CM | POA: Diagnosis present

## 2023-08-08 DIAGNOSIS — Z23 Encounter for immunization: Secondary | ICD-10-CM | POA: Diagnosis present

## 2023-08-08 DIAGNOSIS — I252 Old myocardial infarction: Secondary | ICD-10-CM | POA: Diagnosis not present

## 2023-08-08 DIAGNOSIS — N179 Acute kidney failure, unspecified: Secondary | ICD-10-CM | POA: Diagnosis present

## 2023-08-08 DIAGNOSIS — I1310 Hypertensive heart and chronic kidney disease without heart failure, with stage 1 through stage 4 chronic kidney disease, or unspecified chronic kidney disease: Secondary | ICD-10-CM | POA: Diagnosis not present

## 2023-08-08 DIAGNOSIS — Z9889 Other specified postprocedural states: Secondary | ICD-10-CM | POA: Diagnosis not present

## 2023-08-08 DIAGNOSIS — I82412 Acute embolism and thrombosis of left femoral vein: Secondary | ICD-10-CM | POA: Diagnosis present

## 2023-08-08 DIAGNOSIS — M79672 Pain in left foot: Secondary | ICD-10-CM | POA: Diagnosis not present

## 2023-08-08 DIAGNOSIS — N1831 Chronic kidney disease, stage 3a: Secondary | ICD-10-CM | POA: Diagnosis present

## 2023-08-08 DIAGNOSIS — I739 Peripheral vascular disease, unspecified: Secondary | ICD-10-CM | POA: Diagnosis not present

## 2023-08-08 DIAGNOSIS — I129 Hypertensive chronic kidney disease with stage 1 through stage 4 chronic kidney disease, or unspecified chronic kidney disease: Secondary | ICD-10-CM | POA: Diagnosis present

## 2023-08-08 DIAGNOSIS — I70201 Unspecified atherosclerosis of native arteries of extremities, right leg: Secondary | ICD-10-CM | POA: Diagnosis not present

## 2023-08-08 DIAGNOSIS — I82432 Acute embolism and thrombosis of left popliteal vein: Secondary | ICD-10-CM | POA: Diagnosis present

## 2023-08-08 DIAGNOSIS — Z6841 Body Mass Index (BMI) 40.0 and over, adult: Secondary | ICD-10-CM | POA: Diagnosis not present

## 2023-08-08 DIAGNOSIS — I70262 Atherosclerosis of native arteries of extremities with gangrene, left leg: Secondary | ICD-10-CM | POA: Diagnosis present

## 2023-08-08 DIAGNOSIS — I82462 Acute embolism and thrombosis of left calf muscular vein: Secondary | ICD-10-CM | POA: Diagnosis present

## 2023-08-08 DIAGNOSIS — I70239 Atherosclerosis of native arteries of right leg with ulceration of unspecified site: Secondary | ICD-10-CM | POA: Diagnosis present

## 2023-08-08 DIAGNOSIS — I96 Gangrene, not elsewhere classified: Secondary | ICD-10-CM | POA: Diagnosis not present

## 2023-08-08 DIAGNOSIS — R7303 Prediabetes: Secondary | ICD-10-CM | POA: Diagnosis present

## 2023-08-08 DIAGNOSIS — L0390 Cellulitis, unspecified: Secondary | ICD-10-CM | POA: Diagnosis present

## 2023-08-08 DIAGNOSIS — I82442 Acute embolism and thrombosis of left tibial vein: Secondary | ICD-10-CM | POA: Diagnosis present

## 2023-08-08 DIAGNOSIS — J9601 Acute respiratory failure with hypoxia: Secondary | ICD-10-CM | POA: Diagnosis not present

## 2023-08-08 DIAGNOSIS — I2510 Atherosclerotic heart disease of native coronary artery without angina pectoris: Secondary | ICD-10-CM | POA: Diagnosis present

## 2023-08-08 DIAGNOSIS — L98499 Non-pressure chronic ulcer of skin of other sites with unspecified severity: Secondary | ICD-10-CM | POA: Diagnosis not present

## 2023-08-08 DIAGNOSIS — L03116 Cellulitis of left lower limb: Secondary | ICD-10-CM | POA: Diagnosis present

## 2023-08-08 DIAGNOSIS — W19XXXA Unspecified fall, initial encounter: Secondary | ICD-10-CM | POA: Diagnosis present

## 2023-08-08 DIAGNOSIS — I70245 Atherosclerosis of native arteries of left leg with ulceration of other part of foot: Secondary | ICD-10-CM | POA: Diagnosis not present

## 2023-08-08 DIAGNOSIS — G4733 Obstructive sleep apnea (adult) (pediatric): Secondary | ICD-10-CM | POA: Diagnosis present

## 2023-08-08 DIAGNOSIS — I998 Other disorder of circulatory system: Secondary | ICD-10-CM | POA: Diagnosis not present

## 2023-08-08 DIAGNOSIS — L97429 Non-pressure chronic ulcer of left heel and midfoot with unspecified severity: Secondary | ICD-10-CM | POA: Diagnosis present

## 2023-08-08 DIAGNOSIS — I70244 Atherosclerosis of native arteries of left leg with ulceration of heel and midfoot: Secondary | ICD-10-CM | POA: Diagnosis not present

## 2023-08-08 DIAGNOSIS — I82409 Acute embolism and thrombosis of unspecified deep veins of unspecified lower extremity: Secondary | ICD-10-CM | POA: Diagnosis not present

## 2023-08-08 LAB — CBC
HCT: 42.4 % (ref 39.0–52.0)
Hemoglobin: 13.7 g/dL (ref 13.0–17.0)
MCH: 28.2 pg (ref 26.0–34.0)
MCHC: 32.3 g/dL (ref 30.0–36.0)
MCV: 87.4 fL (ref 80.0–100.0)
Platelets: 298 K/uL (ref 150–400)
RBC: 4.85 MIL/uL (ref 4.22–5.81)
RDW: 13.2 % (ref 11.5–15.5)
WBC: 9.5 K/uL (ref 4.0–10.5)
nRBC: 0 % (ref 0.0–0.2)

## 2023-08-08 LAB — APTT: aPTT: 31 s (ref 24–36)

## 2023-08-08 LAB — BASIC METABOLIC PANEL WITH GFR
Anion gap: 9 (ref 5–15)
BUN: 18 mg/dL (ref 8–23)
CO2: 24 mmol/L (ref 22–32)
Calcium: 8.3 mg/dL — ABNORMAL LOW (ref 8.9–10.3)
Chloride: 103 mmol/L (ref 98–111)
Creatinine, Ser: 1.4 mg/dL — ABNORMAL HIGH (ref 0.61–1.24)
GFR, Estimated: 54 mL/min — ABNORMAL LOW (ref >60)
Glucose, Bld: 100 mg/dL — ABNORMAL HIGH (ref 70–99)
Potassium: 3.9 mmol/L (ref 3.5–5.1)
Sodium: 136 mmol/L (ref 135–145)

## 2023-08-08 LAB — VAS US ABI WITH/WO TBI
Left ABI: 0.29
Right ABI: 0.27

## 2023-08-08 MED ORDER — HEPARIN (PORCINE) 25000 UT/250ML-% IV SOLN
2000.0000 [IU]/h | INTRAVENOUS | Status: DC
  Administered 2023-08-08: 1500 [IU]/h via INTRAVENOUS
  Administered 2023-08-09: 1750 [IU]/h via INTRAVENOUS
  Filled 2023-08-08 (×3): qty 250

## 2023-08-08 MED ORDER — SODIUM CHLORIDE 0.9 % IV SOLN: INTRAVENOUS | Status: DC

## 2023-08-08 NOTE — Evaluation (Signed)
Occupational Therapy Evaluation Patient Details Name: Tanner Phillips MRN: [id number] DOB: 1956-06-26 Today's Date: 08/08/2023   History of Present Illness Pt is a 67 yo male admitted with worsening LE cellulitis and found to have LLE DVT. Pt stared on Eliquis and antibiotics. PHM: ruptured disk in back, HTN, CAD s/p MI   Clinical Impression   Pt admitted with the above diagnosis and has the deficits listed below. Pt overall is requiring min to occasional mod assist with LE adls due to extreme pain in LLE and inability to weight bear through the LLE. Pt also unable to walk with walker while NWB on LLE making mobility very difficult. Pt is normally independent with all adls and driving. Pt's wife is recovering from liver cancer and is unable to help much at home.  Feel once this pain is under control, pt will mobilize well. If pt were to dc home today, pt would be limited to stand pivot transfers only and am unsure wife can handle getting pt from car into home and assisting with transfers as needed. One more day of therapy and time to manage pain would be beneficial to both pt and wife and allow for a safer d/c. Will continue to see with focus on adls in standing and safety with adls and walker use.       If plan is discharge home, recommend the following: A little help with bathing/dressing/bathroom;Assistance with cooking/housework;Assist for transportation;Help with stairs or ramp for entrance    Functional Status Assessment  Patient has had a recent decline in their functional status and demonstrates the ability to make significant improvements in function in a reasonable and predictable amount of time.  Equipment Recommendations  BSC/3in1    Recommendations for Other Services       Precautions / Restrictions Precautions Precautions: Fall Restrictions Weight Bearing Restrictions: No Other Position/Activity Restrictions: Pt allowed to weight bear through LLE but unable at this time.       Mobility Bed Mobility Overal bed mobility: Modified Independent             General bed mobility comments: no assist needed. Bed at 30 degrees    Transfers Overall transfer level: Needs assistance Equipment used: Rolling walker (2 wheels) Transfers: Sit to/from Stand, Bed to chair/wheelchair/BSC Sit to Stand: Min assist, From elevated surface (cues for hand placement) Stand pivot transfers: Min assist         General transfer comment: Cues for hand placement. Prefers to push with both hands on walker to stand.      Balance Overall balance assessment: Needs assistance Sitting-balance support: Feet supported Sitting balance-Leahy Scale: Good     Standing balance support: Bilateral upper extremity supported, During functional activity Standing balance-Leahy Scale: Poor Standing balance comment: Pt must have outside support to remain standing. Cannot put weight through LLE.                           ADL either performed or assessed with clinical judgement   ADL Overall ADL's : Needs assistance/impaired Eating/Feeding: Independent;Sitting   Grooming: Set up;Sitting   Upper Body Bathing: Set up;Sitting   Lower Body Bathing: Minimal assistance;Sit to/from stand;Cueing for compensatory techniques   Upper Body Dressing : Set up;Sitting   Lower Body Dressing: Moderate assistance;Sit to/from stand;Cueing for compensatory techniques   Toilet Transfer: Minimal assistance;Stand-pivot;BSC/3in1;Rolling walker (2 wheels) Toilet Transfer Details (indicate cue type and reason): Pt unable to walk to bathroom at this time.  Toileting- Clothing Manipulation and Hygiene: Minimal assistance;Sit to/from stand;Cueing for compensatory techniques       Functional mobility during ADLs: Minimal assistance;Rolling walker (2 wheels) General ADL Comments: Pt lmited due to pain in LLE. Pt unable to bear weight through LLE or take any steps even when NWB.     Vision Baseline  Vision/History: 0 No visual deficits Ability to See in Adequate Light: 0 Adequate Patient Visual Report: No change from baseline Vision Assessment?: No apparent visual deficits     Perception Perception: Within Functional Limits       Praxis Praxis: WFL       Pertinent Vitals/Pain Pain Assessment Pain Assessment: Faces Faces Pain Scale: Hurts whole lot Pain Location: LLE Pain Descriptors / Indicators: Aching, Discomfort, Grimacing Pain Intervention(s): Limited activity within patient's tolerance, Monitored during session, Repositioned, Premedicated before session     Extremity/Trunk Assessment Upper Extremity Assessment Upper Extremity Assessment: Overall WFL for tasks assessed   Lower Extremity Assessment Lower Extremity Assessment: Defer to PT evaluation   Cervical / Trunk Assessment Cervical / Trunk Assessment: Other exceptions (pt states he has ruptured disk in back. Supposed to get it injected in next week or so.)   Communication Communication Communication: No apparent difficulties   Cognition Arousal: Alert Behavior During Therapy: WFL for tasks assessed/performed Overall Cognitive Status: Within Functional Limits for tasks assessed                                 General Comments: intact     General Comments  Pt most limited by pain in LLE    Exercises     Shoulder Instructions      Home Living Family/patient expects to be discharged to:: Private residence Living Arrangements: Spouse/significant other Available Help at Discharge: Family;Available 24 hours/day Type of Home: House Home Access: Stairs to enter [employer] of Steps: 2   Home Layout: One level     Bathroom Shower/Tub: Walk-in shower;Door   [employer] Toilet: Standard     Home Equipment: Agricultural consultant (2 wheels);Wheelchair - Sport and exercise psychologist Comments: Pt needs 3:1      Prior Functioning/Environment Prior Level of Function :  Independent/Modified Independent             Mobility Comments: independent ADLs Comments: independent        OT Problem List: Decreased activity tolerance;Impaired balance (sitting and/or standing);Decreased knowledge of use of DME or AE;Decreased knowledge of precautions;Pain      OT Treatment/Interventions: Self-care/ADL training;Balance training;DME and/or AE instruction;Therapeutic activities    OT Goals(Current goals can be found in the care plan section) Acute Rehab OT Goals Patient Stated Goal: to go home but not burden my wife OT Goal Formulation: With patient/family Time For Goal Achievement: 08/22/23 Potential to Achieve Goals: Good ADL Goals Pt Will Perform Grooming: with modified independence;standing Pt Will Perform Lower Body Bathing: with modified independence;sit to/from stand Pt Will Perform Lower Body Dressing: with modified independence;sit to/from stand Pt Will Perform Tub/Shower Transfer: Shower transfer;with supervision;ambulating;shower seat;rolling walker Additional ADL Goal #1: Pt will walk to bathroom with walker and complete all toileting with mod I  OT Frequency: Min 1X/week    Co-evaluation              AM-PAC OT "6 Clicks" Daily Activity     Outcome Measure Help from another person eating meals?: None Help from another person taking care of personal grooming?: A Little  Help from another person toileting, which includes using toliet, bedpan, or urinal?: A Little Help from another person bathing (including washing, rinsing, drying)?: A Little Help from another person to put on and taking off regular upper body clothing?: A Little Help from another person to put on and taking off regular lower body clothing?: A Lot 6 Click Score: 18   End of Session Equipment Utilized During Treatment: Rolling walker (2 wheels) Nurse Communication: Weight bearing status;Mobility status  Activity Tolerance: Patient limited by pain Patient left: in  bed;with call bell/phone within reach;with family/visitor present  OT Visit Diagnosis: Unsteadiness on feet (R26.81);Other abnormalities of gait and mobility (R26.89);Pain Pain - Right/Left: Left Pain - part of body: Leg                Time: 4098-1191 OT Time Calculation (min): 19 min Charges:  OT General Charges $OT Visit: 1 Visit OT Evaluation $OT Eval Moderate Complexity: 1 Mod  Hope Budds 08/08/2023, 9:25 AM

## 2023-08-08 NOTE — Plan of Care (Signed)
Patient is A&O x 4. VSS, continues on room air.  Wound to left heel- foam changed, between bilateral 4th and 5th toes- gauze changed. C/o pain and pain managed with prn meds.   Verified with on-call team Larkin Ina, NP regarding APTT lab draw prior to Heparin drip initiation and labs done as ordered.  Heparin drip initiated @ 15 ml/hr as ordered- see emar.  Pt aware that he is going to transferred to [facility] health for continuation of care. Larkin Ina, NP aware of transfer.  0015: Pt transferred to [facility] and transported by care link and all patient belongings sent with patient.   Hand off report given to 2W RN - Melissa-RN.   Problem: Education: Goal: Knowledge of General Education information will improve Description: Including pain rating scale, medication(s)/side effects and non-pharmacologic comfort measures Outcome: Progressing   Problem: Health Behavior/Discharge Planning: Goal: Ability to manage health-related needs will improve Outcome: Progressing   Problem: Clinical Measurements: Goal: Ability to maintain clinical measurements within normal limits will improve Outcome: Progressing Goal: Will remain free from infection Outcome: Progressing Goal: Diagnostic test results will improve Outcome: Progressing Goal: Respiratory complications will improve Outcome: Progressing Goal: Cardiovascular complication will be avoided Outcome: Progressing   Problem: Activity: Goal: Risk for activity intolerance will decrease Outcome: Progressing   Problem: Nutrition: Goal: Adequate nutrition will be maintained Outcome: Progressing   Problem: Coping: Goal: Level of anxiety will decrease Outcome: Progressing   Problem: Elimination: Goal: Will not experience complications related to bowel motility Outcome: Progressing Goal: Will not experience complications related to urinary retention Outcome: Progressing   Problem: Pain Management: Goal: General experience of  comfort will improve Outcome: Progressing   Problem: Safety: Goal: Ability to remain free from injury will improve Outcome: Progressing   Problem: Skin Integrity: Goal: Risk for impaired skin integrity will decrease Outcome: Progressing

## 2023-08-08 NOTE — Evaluation (Signed)
Physical Therapy Evaluation Patient Details Name: Tanner Phillips MRN: [id number] DOB: 09/24/1956 Today's Date: 08/08/2023  History of Present Illness  Pt is a 67 yo male admitted with worsening LE cellulitis and found to have LLE DVT. Pt stared on Eliquis and antibiotics. PHM: ruptured disk in back, HTN, CAD s/p MI  Clinical Impression  Pt admitted with above diagnosis.  Pt currently with functional limitations due to the deficits listed below (see PT Problem List). Pt will benefit from acute skilled PT to increase their independence and safety with mobility to allow discharge.       The patient is tolerating no WB on the Left leg to attempt to ambulate with reports of severe pain through the left leg.  Patient requiring RW for support, did not tolerate  even 1 side step. Patient noted to be very restless in bed after attempts to  bear weight.  Patient reports impaired sensation, noted  Ischemic- like area on lateral left foot,  dressings on heel. Patient's wife has picture  of the heel with similar looking areas.  Patient currently would not tolerate ambulation , deemed extreme hardship for DC  with limited tolerance to  mobility. Patient has 2 steps to enter home and would not not be safe  to attempt negotiating steps at this time. Patient has a WC at home but wife would not be able to bump patient up the steps.  Recommend HHPT at DC.      If plan is discharge home, recommend the following: Two people to help with walking and/or transfers;Assistance with cooking/housework;Assist for transportation;Help with stairs or ramp for entrance   Can travel by private vehicle        Equipment Recommendations None recommended by PT  Recommendations for Other Services       Functional Status Assessment Patient has had a recent decline in their functional status and demonstrates the ability to make significant improvements in function in a reasonable and predictable amount of time.     Precautions  / Restrictions Precautions Precautions: Fall Precaution Comments: reports severe pain in the left foot to touch, unable to bear weight Restrictions Other Position/Activity Restrictions: Pt allowed to weight bear through LLE but unable at this time.      Mobility  Bed Mobility               General bed mobility comments: no assist needed. Bed at 30 degrees    Transfers Overall transfer level: Needs assistance Equipment used: Rolling walker (2 wheels) Transfers: Sit to/from Stand, Bed to chair/wheelchair/BSC Sit to Stand: Min assist, From elevated surface Stand pivot transfers: Min assist         General transfer comment: Cues for hand placement. Prefers to push with both hands on walker to stand. Noted to be very shakey and tremulous    Ambulation/Gait               General Gait Details: patient is unable to even tolerate side step along the bed.  Stairs            Wheelchair Mobility     Tilt Bed    Modified Rankin (Stroke Patients Only)       Balance   Sitting-balance support: Feet supported Sitting balance-Leahy Scale: Good     Standing balance support: Bilateral upper extremity supported, During functional activity Standing balance-Leahy Scale: Poor Standing balance comment: Pt must have outside support to remain standing. Cannot put weight through LLE.  Pertinent Vitals/Pain Pain Assessment Faces Pain Scale: Hurts worst Pain Location: left foot to thigh Pain Descriptors / Indicators: Grimacing, Discomfort, Moaning, Guarding, Restless, Stabbing, Throbbing, Tightness Pain Intervention(s): Limited activity within patient's tolerance, Monitored during session, Premedicated before session, Repositioned    Home Living Family/patient expects to be discharged to:: Private residence Living Arrangements: Spouse/significant other Available Help at Discharge: Family;Available 24 hours/day Type of Home:  House Home Access: Stairs to enter   [employer] of Steps: 2   Home Layout: One level Home Equipment: Agricultural consultant (2 wheels);Wheelchair - Lawyer Comments: Pt needs 3:1    Prior Function Prior Level of Function : Independent/Modified Independent             Mobility Comments: independent ADLs Comments: independent     Extremity/Trunk Assessment   Upper Extremity Assessment Upper Extremity Assessment: Overall WFL for tasks assessed    Lower Extremity Assessment Lower Extremity Assessment: LLE deficits/detail LLE Deficits / Details: lacks full ROM ankle dorsiflexion active, slightly increased with PROM using sheet to pull the foot., does not get  to neutra. limited WB, noted ischemic areas on lateral foot, dorsal and plantar and 4th webspace    Cervical / Trunk Assessment Cervical / Trunk Assessment: Other exceptions Cervical / Trunk Exceptions: guarded to  flex trunk  Communication   Communication Communication: No apparent difficulties  Cognition Arousal: Alert Behavior During Therapy: WFL for tasks assessed/performed Overall Cognitive Status: Within Functional Limits for tasks assessed                                 General Comments: anxious with LLE pain        General Comments      Exercises Other Exercises Other Exercises: dorsiflexion stretch with sheet at foot.   Assessment/Plan    PT Assessment Patient needs continued PT services  PT Problem List Decreased strength;Decreased mobility;Decreased activity tolerance;Pain;Decreased balance;Decreased knowledge of use of DME;Decreased knowledge of precautions;Decreased range of motion;Decreased safety awareness       PT Treatment Interventions DME instruction;Therapeutic exercise;Gait training;Stair training;Functional mobility training;Therapeutic activities;Patient/family education    PT Goals (Current goals can be found in the Care Plan section)   Acute Rehab PT Goals Patient Stated Goal: to be able to walk again, no pain PT Goal Formulation: With patient/family Time For Goal Achievement: 08/22/23 Potential to Achieve Goals: Good    Frequency Min 1X/week     Co-evaluation               AM-PAC PT "6 Clicks" Mobility  Outcome Measure Help needed turning from your back to your side while in a flat bed without using bedrails?: None Help needed moving from lying on your back to sitting on the side of a flat bed without using bedrails?: None Help needed moving to and from a bed to a chair (including a wheelchair)?: None Help needed standing up from a chair using your arms (e.g., wheelchair or bedside chair)?: A Lot Help needed to walk in hospital room?: Total Help needed climbing 3-5 steps with a railing? : Total 6 Click Score: 16    End of Session Equipment Utilized During Treatment: Gait belt Activity Tolerance: Patient limited by pain Patient left: in bed;with call bell/phone within reach;with family/visitor present Nurse Communication: Mobility status PT Visit Diagnosis: Unsteadiness on feet (R26.81);Muscle weakness (generalized) (M62.81)    Time: 3244-0102 PT Time Calculation (min) (ACUTE ONLY): 18 min  Charges:   PT Evaluation $PT Eval Low Complexity: 1 Low   PT General Charges $$ ACUTE PT VISIT: 1 Visit         Blanchard Kelch PT Acute Rehabilitation Services Office [phone] Weekend pager-[phone]   Rada Hay 08/08/2023, 1:18 PM

## 2023-08-08 NOTE — Progress Notes (Signed)
PHARMACY - ANTICOAGULATION CONSULT NOTE  Pharmacy Consult for Eliquis >> Heparin Indication: DVT  No Known Allergies  Patient Measurements: Height: 5\' 5"  (165.1 cm) Weight: 120.7 kg (266 lb) IBW/kg (Calculated) : 61.5 Heparin Dosing Weight: 90 kg  Vital Signs: Temp: 98.3 F (36.8 C) (10/30 0550) BP: 116/64 (10/30 0550) Pulse Rate: 65 (10/30 0550)  Labs: Recent Labs    08/06/23 0927 08/07/23 0445 08/08/23 0430  HGB 14.2 14.1 13.7  HCT 43.6 44.1 42.4  PLT 240 257 298  CREATININE 1.39* 1.42* 1.40*    Estimated Creatinine Clearance: 60 mL/min (A) (by C-G formula based on SCr of 1.4 mg/dL (H)).   Medical History: Past Medical History:  Diagnosis Date   Chest pain    History of Bell's palsy    Knee pain    Myocardial infarction (HCC)    14-15 yrs. ago   Obesity    Slipped cervical disc    SOB (shortness of breath)     Medications:  Medications Prior to Admission  Medication Sig Dispense Refill Last Dose   ALPRAZolam (XANAX) 0.5 MG tablet Take 0.5 mg by mouth 3 (three) times daily as needed for anxiety.   08/04/2023   amLODipine (NORVASC) 10 MG tablet Take 10 mg by mouth daily.   08/05/2023   clindamycin (CLEOCIN) 300 MG capsule Take 300 mg by mouth 3 (three) times daily.   08/05/2023   cyclobenzaprine (FLEXERIL) 10 MG tablet Take 10 mg by mouth as needed for muscle spasms.   unk   gabapentin (NEURONTIN) 300 MG capsule Take 300 mg by mouth 2 (two) times daily.   08/05/2023   HYDROcodone-acetaminophen (NORCO/VICODIN) 5-325 MG tablet Take 1 tablet by mouth 3 (three) times daily as needed for moderate pain (pain score 4-6) or severe pain (pain score 7-10).   08/05/2023   ibuprofen (ADVIL) 200 MG tablet Take 600 mg by mouth as needed for mild pain (pain score 1-3) or moderate pain (pain score 4-6).   Past Week   traMADol (ULTRAM) 50 MG tablet Take 50 mg by mouth as needed for moderate pain (pain score 4-6) or severe pain (pain score 7-10).   unk   zolpidem (AMBIEN) 10  MG tablet Take 10 mg by mouth at bedtime.   08/05/2023   predniSONE (DELTASONE) 10 MG tablet Take by mouth. (Patient not taking: Reported on 08/06/2023)   Not Taking   Scheduled:   amLODipine  10 mg Oral Daily   cefadroxil  500 mg Oral BID   gabapentin  300 mg Oral BID   zolpidem  5 mg Oral QHS    Assessment: 67 YO male presenting 10/28 with a new L sided DVT. He also has ischemic changes on the left heel with ulceration as well as ischemic changes with ulceration between the fourth and fifth toes in the left foot--suggestive of underlying PAD. Pharmacy was originally consulted to dose Eliquis (last dose 10/30 @1005 ) but now transitioning to heparin in anticipation of lower extremity arteriogram (tentative plan for this Friday at [facility]).  Today, 08/08/23 Hgb 13.7, plts 298--stable Scr 1.40 (seems to be ~baseline), CrCl 60 ml/min No s/sx of bleeding reported  Avoid bolusing heparin given recent administration of Eliquis  Goal of Therapy:  Heparin level 0.3-0.7 units/ml aPTT 66-102 seconds Monitor platelets by anticoagulation protocol: Yes   Plan:  Stop Eliquis  Start heparin gtt @1500  units/hr on 10/30 @2200  Check aPTT and HL with AM labs  Monitor heparin level, aPTT, CBC, and s/sx of bleeding  daily   Cherylin Mylar, PharmD Clinical Pharmacist  10/30/20243:56 PM

## 2023-08-08 NOTE — Progress Notes (Signed)
[facility] Progress Note Patient: Tanner Phillips NGE:[id number] DOB: 08/20/1956 DOA: 08/06/2023  DOS: the patient was seen and examined on 08/08/2023  Brief hospital course: PMH of HTN, CAD, chronic back pain, morbid obesity.  Present to the hospital with complaints of left leg pain ongoing for last 10 days. Also noted to have lesions on left heel as well as bilateral great toe. Also has ulcers in the intertriginous area bilaterally between fourth and the fifth toes. This all has been progressively worsening. He saw neurosurgery outpatient as he thought that the symptoms are related to his chronic spondylosis but he was referred for further workup to ER. Currently being treated for DVT with anticoagulation. Vascular surgery consulted.  ABIs are positive for severe disease and patient will be undergoing angiography possibly on Friday.  Assessment and Plan: Acute left leg DVT. Presents with leg pain with redness and swelling. Lower extremity Dopplers shows acute DVT involving left femoral vein, popliteal vein as well as posterior tibial vein and peroneal vein. There is also SVT involving small saphenous vein as well as intramuscular thrombosis involving left gastrocnemius veins and left soleus vein. Does not appear to have any significant provocative event other than pain that he has been dealing with for last 10 days. I suspect that the leg pain started first then he developed a DVT due to immobility. Patient is currently being treated with anticoagulation initially was on heparin and later was on Eliquis.  Now back on heparin again for vascular procedure scheduled later this week.  Severe PAD. Critical limb ischemia. The patient presents with complaints of left leg pain.  Legs have always felt cold per patient.  He has loss of hair growth on his bilateral lower extremities. Pulses are faint. ABIs are 0.27 on right and 0.29 on left. He has lesions that appears to be ischemic lesions  primarily on the left leg. Vascular surgery was consulted. Patient scheduled for angiography. He will need Eliquis washout before procedure can be scheduled. Patient will require transfer to [facility] for intervention.  Left leg cellulitis?. Bilateral fourth and fifth toe intertriginous ulceration Suspect this is related to vascular issues. On cefadroxil.  I will continue this.  For total 7-day treatment though.  AKI versus CKD 3A. Patient presents with serum creatinine of 1.39. He had normal serum creatinine back in 2009 but in 2018 his serum creatinine was 1.58. Patient tells me that he has never been told that he has chronic kidney disease. 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 For now I will provide IV hydration and monitor for improvement. Places the patient at high risk for further worsening of his renal function post angiography.  Chronic back pain with spondylosis. Outpatient follow-up with Dr. Lovell Sheehan.  Morbid obesity. Placing the patient at high risk for poor outcome.  HTN. Blood pressure stable. Monitor.  HLD. Check lipid panel.  Subjective: Reports severe pain.  Unable to bear any weight.  Unchanged pain despite significant improvement in swelling compared to yesterday.  Physical Exam: General: in moderate distress, No Rash Cardiovascular: S1 and S2 Present, No Murmur Respiratory: Good respiratory effort, Bilateral Air entry present. No Crackles, No wheezes Abdomen: Bowel Sound present, No tenderness Extremities: Improving edema Neuro: Alert and oriented x3, no new focal deficit          Data Reviewed: I have Reviewed nursing notes, Vitals, and Lab results. Since last encounter, pertinent lab  results CBC and BMP   . I have ordered test including CBC and BMP  . I have discussed pt's care plan and test results with vascular surgery and physical therapy  . I have  ordered imaging ABI  .   Disposition: Status is: Observation Patient will need to be switched to inpatient.  Patient will require transfer to [facility] for urgent intervention  SCDs Start: 08/06/23 1454   Family Communication: Discussed with wife at bedside Level of care: Telemetry Medical switch to telemetry for transfer to [facility].  Can remain at [facility] on MedSurg for now. Vitals:   08/07/23 1157 08/07/23 1940 08/08/23 0550 08/08/23 1610  BP: (!) 142/76 136/60 116/64 (!) 146/64  Pulse: 96 72 65 (!) 108  Resp: 18 18 18 18   Temp: (!) 97.4 F (36.3 C) 99.1 F (37.3 C) 98.3 F (36.8 C) 98.8 F (37.1 C)  TempSrc: Oral     SpO2: 92% 91% 92% 94%  Weight:      Height:         Author: Lynden Oxford, MD 08/08/2023 6:34 PM  Please look on [email or web address] to find out who is on call.

## 2023-08-08 NOTE — Progress Notes (Signed)
ABI's have been completed. Preliminary results can be found in CV Proc through chart review.  Results were given to Dr. Chestine Spore.  08/08/23 3:27 PM Olen Cordial RVT

## 2023-08-08 NOTE — Consult Note (Signed)
Hospital Consult    Reason for Consult:  Left foot pain Referring Physician:  Hospitalist MRN #:  [id number]  History of Present Illness: This is a 67 y.o. male with hx HTN, CKD, and CAD that vascular surgery has been consulted for left foot pain.  Patient states he developed pain in his left heel about 6 weeks ago and then had an ulcer.  He then got pain in the leg about 3 weeks ago.  Ultimately he was admitted to [facility].  He has been treated for a DVT and had a DVT study on 10/28 showing acute left leg DVT with also incidental finding of SFA occlusion with reconstitution of distal popliteal artery.  She has a hard time walking with his leg pain.  Able to wiggle his toes.  No numbness in the foot.  No prior vascular interventions.  Denies smoking.  States he was a [employer].  Past Medical History:  Diagnosis Date   Chest pain    History of Bell's palsy    Knee pain    Myocardial infarction (HCC)    14-15 yrs. ago   Obesity    Slipped cervical disc    SOB (shortness of breath)     Past Surgical History:  Procedure Laterality Date   BACK SURGERY     LOWER   CHOLECYSTECTOMY     COLONOSCOPY WITH PROPOFOL N/A 06/03/2015   Procedure: COLONOSCOPY WITH PROPOFOL;  Surgeon: Charna Elizabeth, MD;  Location: [facility]COPY;  Service: Endoscopy;  Laterality: N/A;   COLONOSCOPY WITH PROPOFOL N/A 07/27/2020   Procedure: COLONOSCOPY WITH PROPOFOL;  Surgeon: Charna Elizabeth, MD;  Location: [facility] ENDOSCOPY;  Service: Endoscopy;  Laterality: N/A;   ESOPHAGOGASTRODUODENOSCOPY (EGD) WITH PROPOFOL N/A 07/27/2020   Procedure: ESOPHAGOGASTRODUODENOSCOPY (EGD) WITH PROPOFOL;  Surgeon: Charna Elizabeth, MD;  Location: [facility] ENDOSCOPY;  Service: Endoscopy;  Laterality: N/A;   Left Shoulder Surgery     Right Shoulder Surgery      No Known Allergies  Prior to Admission medications   Medication Sig Start Date End Date Taking? Authorizing Provider  ALPRAZolam Prudy Feeler) 0.5 MG tablet Take 0.5 mg by mouth 3 (three) times  daily as needed for anxiety.   Yes [provider]  amLODipine (NORVASC) 10 MG tablet Take 10 mg by mouth daily.   Yes [provider]  clindamycin (CLEOCIN) 300 MG capsule Take 300 mg by mouth 3 (three) times daily. 08/04/23  Yes [provider]  cyclobenzaprine (FLEXERIL) 10 MG tablet Take 10 mg by mouth as needed for muscle spasms. 06/22/23  Yes [provider]  gabapentin (NEURONTIN) 300 MG capsule Take 300 mg by mouth 2 (two) times daily.   Yes [provider]  HYDROcodone-acetaminophen (NORCO/VICODIN) 5-325 MG tablet Take 1 tablet by mouth 3 (three) times daily as needed for moderate pain (pain score 4-6) or severe pain (pain score 7-10). 08/02/23  Yes [provider]  ibuprofen (ADVIL) 200 MG tablet Take 600 mg by mouth as needed for mild pain (pain score 1-3) or moderate pain (pain score 4-6).   Yes [provider]  traMADol (ULTRAM) 50 MG tablet Take 50 mg by mouth as needed for moderate pain (pain score 4-6) or severe pain (pain score 7-10).   Yes [provider]  zolpidem (AMBIEN) 10 MG tablet Take 10 mg by mouth at bedtime.   Yes [provider]  predniSONE (DELTASONE) 10 MG tablet Take by mouth. Patient not taking: Reported on 08/06/2023 07/10/23   [provider]  Social History   Socioeconomic History   Marital status: Married    Spouse name: Not on file   Number of children: Not on file   Years of education: Not on file   Highest education level: Not on file  Occupational History   Not on file  Tobacco Use   Smoking status: Never   Smokeless tobacco: Never  Substance and Sexual Activity   Alcohol use: Yes    Comment: social   Drug use: No   Sexual activity: Not on file  Other Topics Concern   Not on file  Social History Narrative   MARRIED -WORKS AT [employer]   Social Determinants of Health   Financial Resource Strain: Not on file  Food Insecurity: No Food  Insecurity (08/06/2023)   Hunger Vital Sign    Worried About Running Out of Food in the Last Year: Never true    Ran Out of Food in the Last Year: Never true  Transportation Needs: No Transportation Needs (08/06/2023)   PRAPARE - Administrator, Civil Service (Medical): No    Lack of Transportation (Non-Medical): No  Physical Activity: Not on file  Stress: Not on file  Social Connections: Not on file  Intimate Partner Violence: Not At Risk (08/06/2023)   Humiliation, Afraid, Rape, and Kick questionnaire    Fear of Current or Ex-Partner: No    Emotionally Abused: No    Physically Abused: No    Sexually Abused: No     History reviewed. No pertinent family history.  ROS: [x]  Positive   [ ]  Negative   [ ]  All sytems reviewed and are negative  Cardiovascular: []  chest pain/pressure []  palpitations []  SOB lying flat []  DOE []  pain in legs while walking []  pain in legs at rest []  pain in legs at night []  non-healing ulcers []  hx of DVT []  swelling in legs  Pulmonary: []  productive cough []  asthma/wheezing []  home O2  Neurologic: []  weakness in []  arms []  legs []  numbness in []  arms []  legs []  hx of CVA []  mini stroke [] difficulty speaking or slurred speech []  temporary loss of vision in one eye []  dizziness  Hematologic: []  hx of cancer []  bleeding problems []  problems with blood clotting easily  Endocrine:   []  diabetes []  thyroid disease  GI []  vomiting blood []  blood in stool  GU: []  CKD/renal failure []  HD--[]  M/W/F or []  T/T/S []  burning with urination []  blood in urine  Psychiatric: []  anxiety []  depression  Musculoskeletal: []  arthritis []  joint pain  Integumentary: []  rashes []  ulcers  Constitutional: []  fever []  chills   Physical Examination  Vitals:   08/07/23 1940 08/08/23 0550  BP: 136/60 116/64  Pulse: 72 65  Resp: 18 18  Temp: 99.1 F (37.3 C) 98.3 F (36.8 C)  SpO2: 91% 92%   Body mass index is 44.26  kg/m.  General:  NAD Gait: Not observed HENT: WNL, normocephalic Pulmonary: normal non-labored breathing Cardiac: regular, without  Murmurs, rubs or gallops Abdomen:  soft, NT/ND Vascular Exam/Pulses: Palpable femoral pulses bilaterally Ischemic changes to the left heel as well as the between the left fourth and fifth toes with ulceration Monophasic left DP PT signals Musculoskeletal: no muscle wasting or atrophy  Neurologic: A&O X 3; Appropriate Affect ; SENSATION: normal; MOTOR FUNCTION:  moving all extremities equally. Speech is fluent/normal       CBC    Component Value Date/Time   WBC 9.5 08/08/2023 0430  RBC 4.85 08/08/2023 0430   HGB 13.7 08/08/2023 0430   HCT 42.4 08/08/2023 0430   PLT 298 08/08/2023 0430   MCV 87.4 08/08/2023 0430   MCH 28.2 08/08/2023 0430   MCHC 32.3 08/08/2023 0430   RDW 13.2 08/08/2023 0430   LYMPHSABS 1.2 08/06/2023 0927   MONOABS 1.3 (H) 08/06/2023 0927   EOSABS 0.1 08/06/2023 0927   BASOSABS 0.1 08/06/2023 0927    BMET    Component Value Date/Time   NA 136 08/08/2023 0430   K 3.9 08/08/2023 0430   CL 103 08/08/2023 0430   CO2 24 08/08/2023 0430   GLUCOSE 100 (H) 08/08/2023 0430   BUN 18 08/08/2023 0430   CREATININE 1.40 (H) 08/08/2023 0430   CALCIUM 8.3 (L) 08/08/2023 0430   GFRNONAA 54 (L) 08/08/2023 0430   GFRAA 52 (L) 05/21/2017 0010    COAGS: Lab Results  Component Value Date   INR 1.6 (H) 02/16/2008   INR 1.5 02/15/2008   INR 1.4 02/14/2008     Non-Invasive Vascular Imaging:    ABIs today are 0.27 on the right monophasic and 0.29 on the left monophasic   ASSESSMENT/PLAN: This is a 67 y.o. male with hx HTN, CKD, and CAD that vascular surgery has been consulted for left foot pain.  He was initially diagnosed with a DVT during this admission but I do not think this explains his entire clinical picture.  Patient describes about 5 to 6 weeks of pain in the left foot.  He has ischemic changes on the left heel with  ulceration as well as ischemic changes with ulceration between the fourth and fifth toes in the left foot.  Discussed his presentation is consistent with critical limb ischemia with tissue loss with underlying PAD.  His ABI is severely depressed on the left at 0.29 (also severely depressed on the right).  I have recommended transfer to [facility] for lower extremity arteriogram possible intervention with a focus on the left leg.  Need to hold his Eliquis.  Heparin bridge is fine.  I will try and get him posted for Friday with one of my partners to allow his Eliquis to washout.  Cephus Shelling, MD [facility] Office: [phone]  Cephus Shelling

## 2023-08-09 ENCOUNTER — Encounter (HOSPITAL_COMMUNITY): Payer: Self-pay | Admitting: Internal Medicine

## 2023-08-09 ENCOUNTER — Inpatient Hospital Stay (HOSPITAL_COMMUNITY): Payer: Medicare Other

## 2023-08-09 DIAGNOSIS — M79605 Pain in left leg: Secondary | ICD-10-CM | POA: Diagnosis not present

## 2023-08-09 DIAGNOSIS — M79672 Pain in left foot: Secondary | ICD-10-CM | POA: Diagnosis not present

## 2023-08-09 LAB — BASIC METABOLIC PANEL
Anion gap: 12 (ref 5–15)
BUN: 13 mg/dL (ref 8–23)
CO2: 20 mmol/L — ABNORMAL LOW (ref 22–32)
Calcium: 8.1 mg/dL — ABNORMAL LOW (ref 8.9–10.3)
Chloride: 107 mmol/L (ref 98–111)
Creatinine, Ser: 1.37 mg/dL — ABNORMAL HIGH (ref 0.61–1.24)
GFR, Estimated: 56 mL/min — ABNORMAL LOW (ref >60)
Glucose, Bld: 91 mg/dL (ref 70–99)
Potassium: 3.7 mmol/L (ref 3.5–5.1)
Sodium: 139 mmol/L (ref 135–145)

## 2023-08-09 LAB — CBC
HCT: 42 % (ref 39.0–52.0)
Hemoglobin: 13.4 g/dL (ref 13.0–17.0)
MCH: 27.3 pg (ref 26.0–34.0)
MCHC: 31.9 g/dL (ref 30.0–36.0)
MCV: 85.5 fL (ref 80.0–100.0)
Platelets: 306 10*3/uL (ref 150–400)
RBC: 4.91 MIL/uL (ref 4.22–5.81)
RDW: 13.3 % (ref 11.5–15.5)
WBC: 9.2 10*3/uL (ref 4.0–10.5)
nRBC: 0 % (ref 0.0–0.2)

## 2023-08-09 LAB — APTT
aPTT: 34 s (ref 24–36)
aPTT: 38 s — ABNORMAL HIGH (ref 24–36)

## 2023-08-09 LAB — HEPARIN LEVEL (UNFRACTIONATED)
Heparin Unfractionated: >1.1 [IU]/mL — ABNORMAL HIGH (ref 0.30–0.70)
Heparin Unfractionated: >1.1 [IU]/mL — ABNORMAL HIGH (ref 0.30–0.70)

## 2023-08-09 NOTE — Progress Notes (Signed)
PHARMACY - ANTICOAGULATION CONSULT NOTE  Pharmacy Consult for Heparin Indication: DVT  No Known Allergies  Patient Measurements: Height: 5\' 5"  (165.1 cm) Weight: 123 kg (271 lb 2.7 oz) IBW/kg (Calculated) : 61.5 Heparin Dosing Weight: 90 kg  Vital Signs: Temp: 99.3 F (37.4 C) (10/31 1945) Temp Source: Oral (10/31 1945) BP: 146/75 (10/31 1945) Pulse Rate: 103 (10/31 1945)  Labs: Recent Labs    08/07/23 0445 08/08/23 0430 08/08/23 2308 08/09/23 0845 08/09/23 1039 08/09/23 2114  HGB 14.1 13.7  --  13.4  --   --   HCT 44.1 42.4  --  42.0  --   --   PLT 257 298  --  306  --   --   APTT  --   --  31 34  --  38*  HEPARINUNFRC  --   --   --  >1.10*  --  >1.10*  CREATININE 1.42* 1.40*  --   --  1.37*  --     Estimated Creatinine Clearance: 62 mL/min (A) (by C-G formula based on SCr of 1.37 mg/dL (H)).   Assessment: 67 YO male presenting 10/28 with a new LLE DVT nvolving the left femoral vein, popliteal vein as well as posterior tibial vein and peroneal vein.  There is also SVT involving the small saphenous vein as well as intramuscular thrombosis involving the left gastrocnemius vein and left soleus vein.  He also has ischemic changes on the left heel with ulceration as well as ischemic changes with ulceration between the fourth and fifth toes in the left foot--suggestive of underlying PAD. Pharmacy was originally consulted to dose Eliquis (last dose 10/30 @1005 ) but was transitioned to IV Heparin on 08/08/23 in anticipation of lower extremity arteriogram (tentative plan for this Friday 08/10/23 at [facility]).  Heparin level >1.1 (effects of Eliquis), aPTT subtherapeutic (38 sec) on infusion at 1750 units/hr. No issues with line or bleeding reported per RN.  Goal of Therapy:  Heparin level 0.3-0.7 units/ml aPTT 66-102 seconds Monitor platelets by anticoagulation protocol: Yes    Plan:  Increase heparin drip to 2000 units/hr  Will f/u aPTT in 6 hours  Christoper Fabian,  PharmD, BCPS Please see amion for complete clinical pharmacist phone list 10/31/20249:54 PM

## 2023-08-09 NOTE — Plan of Care (Signed)

## 2023-08-09 NOTE — Progress Notes (Signed)
Occupational Therapy Treatment Patient Details Name: Tanner Phillips MRN: [id number] DOB: [birth year] Today's Date: 08/09/2023   History of present illness Pt is a 67 yo male admitted with worsening LE cellulitis and found to have LLE DVT. Pt stared on Eliquis and antibiotics. PHM: ruptured disk in back, HTN, CAD s/p MI   OT comments  Pt received pain meds prior to OT attempt though still remains pain limited. Pt able to complete bed mobility without assist but unable to dangle more than approx 5 min before significant BLE pain increase. Educated and demonstrated AP transfer for BSC vs scoot transfer if drop arm BSC located. Discussed compensatory strategies for LB ADLs w/ pt already implementing techniques to maximize independence. Will follow up post op to re-evaluate functional abilities.       If plan is discharge home, recommend the following:  A little help with bathing/dressing/bathroom;Assistance with cooking/housework;Assist for transportation;Help with stairs or ramp for entrance   Equipment Recommendations  BSC/3in1 (already received BSC)    Recommendations for Other Services      Precautions / Restrictions Precautions Precautions: Fall Restrictions Weight Bearing Restrictions: No LLE Weight Bearing: Weight bearing as tolerated       Mobility Bed Mobility Overal bed mobility: Modified Independent                  Transfers                         Balance Overall balance assessment: Needs assistance Sitting-balance support: Feet supported Sitting balance-Leahy Scale: Normal                                     ADL either performed or assessed with clinical judgement   ADL Overall ADL's : Needs assistance/impaired                       Lower Body Dressing Details (indicate cue type and reason): Pt reports able to don underwear bed level rolling 4-5 tmes. unable to reach feet for socks or bring feet to self   Toilet Transfer  Details (indicate cue type and reason): discussed and demonstrated AP transfer to [facility] in room d/t current pain levels as pt reports currently using bed pan. Pt reports understanding of AP transfer but declined to attempt (politely). Also discussed scoot transfers with drop arm BSC                Extremity/Trunk Assessment Upper Extremity Assessment Upper Extremity Assessment: Overall WFL for tasks assessed   Lower Extremity Assessment Lower Extremity Assessment: Defer to PT evaluation        Vision   Vision Assessment?: No apparent visual deficits   Perception     Praxis      Cognition Arousal: Alert Behavior During Therapy: WFL for tasks assessed/performed Overall Cognitive Status: Within Functional Limits for tasks assessed                                          Exercises Exercises: Other exercises Other Exercises Other Exercises: AROM BLE bed level    Shoulder Instructions       General Comments Elevated LLE at end of session for pt comfort    Pertinent Vitals/ Pain  Pain Assessment Pain Assessment: Faces Faces Pain Scale: Hurts worst Pain Location: LL E> RLE with dependent position or any touch to floor Pain Descriptors / Indicators: Sharp, Shooting Pain Intervention(s): Monitored during session, Limited activity within patient's tolerance, Repositioned  Home Living                                          Prior Functioning/Environment              Frequency  Min 1X/week        Progress Toward Goals  OT Goals(current goals can now be found in the care plan section)  Progress towards OT goals: Progressing toward goals  Acute Rehab OT Goals Patient Stated Goal: have procedure and relieve pain OT Goal Formulation: With patient/family Time For Goal Achievement: 08/22/23 Potential to Achieve Goals: Good ADL Goals Pt Will Perform Grooming: with modified independence;standing Pt Will Perform Lower  Body Bathing: with modified independence;sit to/from stand Pt Will Perform Lower Body Dressing: with modified independence;sit to/from stand Pt Will Perform Tub/Shower Transfer: Shower transfer;with supervision;ambulating;shower seat;rolling walker Additional ADL Goal #1: Pt will walk to bathroom with walker and complete all toileting with mod I  Plan      Co-evaluation                 AM-PAC OT "6 Clicks" Daily Activity     Outcome Measure   Help from another person eating meals?: None Help from another person taking care of personal grooming?: A Little Help from another person toileting, which includes using toliet, bedpan, or urinal?: A Little Help from another person bathing (including washing, rinsing, drying)?: A Little Help from another person to put on and taking off regular upper body clothing?: A Little Help from another person to put on and taking off regular lower body clothing?: A Little 6 Click Score: 19    End of Session    OT Visit Diagnosis: Unsteadiness on feet (R26.81);Other abnormalities of gait and mobility (R26.89);Pain Pain - Right/Left: Left Pain - part of body: Leg   Activity Tolerance Patient limited by pain   Patient Left in bed;with call bell/phone within reach   Nurse Communication          Time: 1610-9604 OT Time Calculation (min): 15 min  Charges: OT General Charges $OT Visit: 1 Visit OT Treatments $Therapeutic Activity: 8-22 mins  Bradd Canary, OTR/L Acute Rehab Services Office: [phone]   Lorre Munroe 08/09/2023, 11:25 AM

## 2023-08-09 NOTE — Progress Notes (Signed)
Oral  Oral  SpO2: 94% 93%  (!) 89%  Weight:   123 kg   Height:        Intake/Output Summary (Last 24 hours) at 08/09/2023 0811 Last data filed at 08/09/2023 0100 Gross per 24 hour  Intake 48.77 ml  Output 300 ml  Net -251.23 ml   Filed Weights   08/06/23 0833 08/09/23 0325  Weight: 120.7 kg 123 kg    Examination:  General exam: Appears calm and comfortable  Respiratory system: Clear to auscultation.  Respiratory effort normal. Cardiovascular system: S1 & S2 heard, RRR. No JVD, murmurs, rubs, gallops or clicks. No pedal edema. Gastrointestinal system: Abdomen is nondistended, soft and nontender. No organomegaly or masses felt. Normal bowel sounds heard. Central nervous system: Alert and oriented. No focal neurological deficits. Extremities: left LE with dark discoloration heel, and discoloration between 4th and 5th toes.     Data Reviewed: I have personally reviewed following labs and imaging studies  CBC: Recent Labs  Lab 08/06/23 0927 08/07/23 0445 08/08/23 0430  WBC 12.8* 12.0* 9.5  NEUTROABS 10.0*  --   --   HGB 14.2 14.1 13.7  HCT 43.6 44.1 42.4  MCV 87.9 88.6 87.4  PLT 240 257 298   Basic Metabolic Panel: Recent Labs  Lab 08/06/23 0927 08/07/23 0445 08/08/23 0430  NA 136 136 136  K 4.0 3.8 3.9  CL 102 103 103  CO2 24 22 24   GLUCOSE 103* 96 100*  BUN 17 17 18   CREATININE 1.39* 1.42* 1.40*  CALCIUM 8.4* 8.4* 8.3*   GFR: Estimated Creatinine Clearance: 60.6 mL/min (A) (by C-G formula based on SCr of 1.4 mg/dL (H)). Liver Function Tests: No results for input(s): "AST", "ALT", "ALKPHOS", "BILITOT", "PROT", "ALBUMIN" in the last 168 hours. No results for input(s): "LIPASE", "AMYLASE" in the last 168 hours. No results for input(s): "AMMONIA" in the last 168 hours. Coagulation Profile: No results for input(s): "INR", "PROTIME" in the last 168 hours. Cardiac Enzymes: No results for input(s): "CKTOTAL", "CKMB", "CKMBINDEX", "TROPONINI" in the last 168 hours. BNP (last 3 results) No results for input(s): "PROBNP" in the last 8760 hours. HbA1C: No results for input(s): "HGBA1C" in the last 72 hours. CBG: No results for input(s): "GLUCAP" in the last 168 hours. Lipid Profile: No results for input(s): "CHOL", "HDL", "LDLCALC", "TRIG", "CHOLHDL", "LDLDIRECT" in the last 72 hours. Thyroid Function Tests: No results for input(s): "TSH", "T4TOTAL", "FREET4", "T3FREE",  "THYROIDAB" in the last 72 hours. Anemia Panel: No results for input(s): "VITAMINB12", "FOLATE", "FERRITIN", "TIBC", "IRON", "RETICCTPCT" in the last 72 hours. Sepsis Labs: Recent Labs  Lab 08/06/23 1532 08/06/23 1743  LATICACIDVEN 1.0 1.0    No results found for this or any previous visit (from the past 240 hour(s)).       Radiology Studies: VAS [place] ABI WITH/WO TBI  Result Date: 08/08/2023  LOWER EXTREMITY DOPPLER STUDY Patient Name:  KYLE HANDCOCK  Date of Exam:   08/08/2023 Medical Rec #: [id number]     Accession #:    [id number] Date of Birth: 1956/06/05      Patient Gender: M Patient Age:   67 years Exam Location:  [facility] Procedure:      VAS [place] ABI WITH/WO TBI Referring Phys: PRANAV PATEL --------------------------------------------------------------------------------  Indications: Ulceration, and gangrene. High Risk Factors: Hypertension.  Limitations: Today's exam was limited due to an open wound, involuntary patient              movement, bandages and patient  PROGRESS NOTE    JAHMALI HINNANT  NUU:[id number] DOB: Jun 12, 1956 DOA: 08/06/2023 PCP: Ralene Ok, MD   Brief Narrative: 67 year old with past medical history significant for hypertension, lumbar disc disease, CAD status post MI, morbid obesity presents complaining of left leg pain ongoing for the last 10 days.  He also noted to have a lesion on the left heel as well as bilateral great toe.  He has also ulcers  in the intertriginous area bilaterally between fourth and fifth toes.  This has been progressively getting worse.  Patient presented with worsening left lower extremity pain redness and edema.  Patient was found to have left lower extremity DVT he was started on anticoagulation, he has been getting antibiotics for cellulitis as well.  ABIs were obtained on 10/30: Consistent with severe right and left lower extremity arterial disease. Vascular surgery consulted, he was transfer to [facility].  Plan is for angiography on Friday.      Assessment & Plan:   Principal Problem:   DVT, lower extremity (HCC) Active Problems:   Cellulitis   Radiculopathy   Essential hypertension   Renal insufficiency   PAD (peripheral artery disease) (HCC)  1-Acute left lower extremity DVT: Lower extremity Doppler show acute DVT involving the left femoral vein, popliteal vein as well as posterior tibial vein and peroneal vein.  There is also SVT involving the small saphenous vein as well as intramuscular thrombosis involving the left gastrocnemius vein and left soleus vein. -Transition back to heparin for procedure on Friday -He has been more sedentary over last 10 day. Will check factor V leyden, Lupus anticoagulant, Antithrombin. Rest hypercoagulable panel out patient. Advised to follow up with PCP appropriate screening for age for malignancy.  He report fall 1 days ago, hit head and had black eye. Will proceed with CT head.   2-Critical limb ischemia Severe PAD bilaterally ABIs are 0.2 on the right and  0.9 on the left He has lesions that appears to be ischemic lesions primarily on the left leg. Vascular surgery consulted. Eliquis changed to heparin for angiography on Friday Check lipid panel and A1c.   Left leg cellulitis Bilateral fourth and fifth toe intertriginous ulceration suspect related to vascular disease continue cefadroxil to complete 7-day Wound care consulted.   AKI versus CKD 3 A Five year agio Cr 1.5 GFR 45. Monitor renal function.   Chronic back pain with a spondylolisthesis Follow up with Dr Lovell Sheehan out patient.  He might have to delay steroid injection.   Morbid obesity: need life style modification.   Hypertension Continue with Norvasc.   Hyperlipoidemia Check lipid panel.    Estimated body mass index is 45.12 kg/m as calculated from the following:   Height as of this encounter: 5\' 5"  (1.651 m).   Weight as of this encounter: 123 kg.   DVT prophylaxis: heparin gtt Code Status: Full code Family Communication: family at bedside.  Disposition Plan:  Status is: Inpatient Remains inpatient appropriate because: management of DVT and critical limb ischemia.     Consultants:  Vascular.   Procedures:  Doppler;  ABI  Antimicrobials:    Subjective: He report pain left foot.  He has been sedentary for last 10 days.   Objective: Vitals:   08/08/23 2017 08/09/23 0048 08/09/23 0325 08/09/23 0440  BP: 128/82 135/86  129/65  Pulse: 68 (!) 106  90  Resp: 17 19  17   Temp: 98.4 F (36.9 C) 98.1 F (36.7 C)  98.2 F (36.8 C)  TempSrc:  PROGRESS NOTE    JAHMALI HINNANT  NUU:[id number] DOB: Jun 12, 1956 DOA: 08/06/2023 PCP: Ralene Ok, MD   Brief Narrative: 67 year old with past medical history significant for hypertension, lumbar disc disease, CAD status post MI, morbid obesity presents complaining of left leg pain ongoing for the last 10 days.  He also noted to have a lesion on the left heel as well as bilateral great toe.  He has also ulcers  in the intertriginous area bilaterally between fourth and fifth toes.  This has been progressively getting worse.  Patient presented with worsening left lower extremity pain redness and edema.  Patient was found to have left lower extremity DVT he was started on anticoagulation, he has been getting antibiotics for cellulitis as well.  ABIs were obtained on 10/30: Consistent with severe right and left lower extremity arterial disease. Vascular surgery consulted, he was transfer to [facility].  Plan is for angiography on Friday.      Assessment & Plan:   Principal Problem:   DVT, lower extremity (HCC) Active Problems:   Cellulitis   Radiculopathy   Essential hypertension   Renal insufficiency   PAD (peripheral artery disease) (HCC)  1-Acute left lower extremity DVT: Lower extremity Doppler show acute DVT involving the left femoral vein, popliteal vein as well as posterior tibial vein and peroneal vein.  There is also SVT involving the small saphenous vein as well as intramuscular thrombosis involving the left gastrocnemius vein and left soleus vein. -Transition back to heparin for procedure on Friday -He has been more sedentary over last 10 day. Will check factor V leyden, Lupus anticoagulant, Antithrombin. Rest hypercoagulable panel out patient. Advised to follow up with PCP appropriate screening for age for malignancy.  He report fall 1 days ago, hit head and had black eye. Will proceed with CT head.   2-Critical limb ischemia Severe PAD bilaterally ABIs are 0.2 on the right and  0.9 on the left He has lesions that appears to be ischemic lesions primarily on the left leg. Vascular surgery consulted. Eliquis changed to heparin for angiography on Friday Check lipid panel and A1c.   Left leg cellulitis Bilateral fourth and fifth toe intertriginous ulceration suspect related to vascular disease continue cefadroxil to complete 7-day Wound care consulted.   AKI versus CKD 3 A Five year agio Cr 1.5 GFR 45. Monitor renal function.   Chronic back pain with a spondylolisthesis Follow up with Dr Lovell Sheehan out patient.  He might have to delay steroid injection.   Morbid obesity: need life style modification.   Hypertension Continue with Norvasc.   Hyperlipoidemia Check lipid panel.    Estimated body mass index is 45.12 kg/m as calculated from the following:   Height as of this encounter: 5\' 5"  (1.651 m).   Weight as of this encounter: 123 kg.   DVT prophylaxis: heparin gtt Code Status: Full code Family Communication: family at bedside.  Disposition Plan:  Status is: Inpatient Remains inpatient appropriate because: management of DVT and critical limb ischemia.     Consultants:  Vascular.   Procedures:  Doppler;  ABI  Antimicrobials:    Subjective: He report pain left foot.  He has been sedentary for last 10 days.   Objective: Vitals:   08/08/23 2017 08/09/23 0048 08/09/23 0325 08/09/23 0440  BP: 128/82 135/86  129/65  Pulse: 68 (!) 106  90  Resp: 17 19  17   Temp: 98.4 F (36.9 C) 98.1 F (36.7 C)  98.2 F (36.8 C)  TempSrc:

## 2023-08-09 NOTE — Plan of Care (Signed)
  Problem: Education: Goal: Knowledge of General Education information will improve Description: Including pain rating scale, medication(s)/side effects and non-pharmacologic comfort measures Outcome: Progressing   Problem: Health Behavior/Discharge Planning: Goal: Ability to manage health-related needs will improve Outcome: Progressing   Problem: Clinical Measurements: Goal: Ability to maintain clinical measurements within normal limits will improve Outcome: Progressing Goal: Will remain free from infection Outcome: Progressing Goal: Diagnostic test results will improve Outcome: Progressing Goal: Respiratory complications will improve Outcome: Progressing Goal: Cardiovascular complication will be avoided Outcome: Progressing   Problem: Clinical Measurements: Goal: Will remain free from infection Outcome: Progressing   Problem: Activity: Goal: Risk for activity intolerance will decrease Outcome: Progressing

## 2023-08-09 NOTE — Consult Note (Signed)
WOC Nurse Consult Note: Consult requested for left foot wound.  This was already performed on 10/29 and topical treatment orders have been provided for bedside nurses to perform. Vascular consult was performed on 10/30 and they plan to perform a re-vascularization procedure tomorrow.  ABI was decreased at .29. Please re-consult if further assistance is needed.  Thank-you,  Cammie Mcgee MSN, RN, CWOCN, [place], CNS [phone]

## 2023-08-09 NOTE — Progress Notes (Signed)
PT Cancellation Note  Patient Details Name: Tanner Phillips MRN: [id number] DOB: 29-Aug-1956   Cancelled Treatment:    Reason Eval/Treat Not Completed: (P) Medical issues which prohibited therapy (Pt about to be taken by transport and RN requesting to hold therapy today due to DVT. Will continue to follow per PT POC.)   Johny Shock 08/09/2023, 2:25 PM

## 2023-08-09 NOTE — Progress Notes (Signed)
PHARMACY - ANTICOAGULATION CONSULT NOTE  Pharmacy Consult for Eliquis >> Heparin Indication: DVT  No Known Allergies  Patient Measurements: Height: 5\' 5"  (165.1 cm) Weight: 123 kg (271 lb 2.7 oz) IBW/kg (Calculated) : 61.5 Heparin Dosing Weight: 90 kg  Vital Signs: Temp: 98.6 F (37 C) (10/31 0834) Temp Source: Oral (10/31 0834) BP: 127/88 (10/31 0834) Pulse Rate: 95 (10/31 0834)  Labs: Recent Labs    08/07/23 0445 08/08/23 0430 08/08/23 2308 08/09/23 0845 08/09/23 1039  HGB 14.1 13.7  --  13.4  --   HCT 44.1 42.4  --  42.0  --   PLT 257 298  --  306  --   APTT  --   --  31 34  --   HEPARINUNFRC  --   --   --  >1.10*  --   CREATININE 1.42* 1.40*  --   --  1.37*    Estimated Creatinine Clearance: 62 mL/min (A) (by C-G formula based on SCr of 1.37 mg/dL (H)).   Medical History: Past Medical History:  Diagnosis Date   Chest pain    History of Bell's palsy    Knee pain    Myocardial infarction (HCC)    14-15 yrs. ago   Obesity    Slipped cervical disc    SOB (shortness of breath)     Medications:  Medications Prior to Admission  Medication Sig Dispense Refill Last Dose   ALPRAZolam (XANAX) 0.5 MG tablet Take 0.5 mg by mouth 3 (three) times daily as needed for anxiety.   08/04/2023   amLODipine (NORVASC) 10 MG tablet Take 10 mg by mouth daily.   08/05/2023   clindamycin (CLEOCIN) 300 MG capsule Take 300 mg by mouth 3 (three) times daily.   08/05/2023   cyclobenzaprine (FLEXERIL) 10 MG tablet Take 10 mg by mouth as needed for muscle spasms.   unk   gabapentin (NEURONTIN) 300 MG capsule Take 300 mg by mouth 2 (two) times daily.   08/05/2023   HYDROcodone-acetaminophen (NORCO/VICODIN) 5-325 MG tablet Take 1 tablet by mouth 3 (three) times daily as needed for moderate pain (pain score 4-6) or severe pain (pain score 7-10).   08/05/2023   ibuprofen (ADVIL) 200 MG tablet Take 600 mg by mouth as needed for mild pain (pain score 1-3) or moderate pain (pain score 4-6).    Past Week   traMADol (ULTRAM) 50 MG tablet Take 50 mg by mouth as needed for moderate pain (pain score 4-6) or severe pain (pain score 7-10).   unk   zolpidem (AMBIEN) 10 MG tablet Take 10 mg by mouth at bedtime.   08/05/2023   predniSONE (DELTASONE) 10 MG tablet Take by mouth. (Patient not taking: Reported on 08/06/2023)   Not Taking   Scheduled:   amLODipine  10 mg Oral Daily   cefadroxil  500 mg Oral BID   gabapentin  300 mg Oral BID   zolpidem  5 mg Oral QHS    Assessment: 67 YO male presenting 10/28 with a new LLE DVT nvolving the left femoral vein, popliteal vein as well as posterior tibial vein and peroneal vein.  There is also SVT involving the small saphenous vein as well as intramuscular thrombosis involving the left gastrocnemius vein and left soleus vein.  He also has ischemic changes on the left heel with ulceration as well as ischemic changes with ulceration between the fourth and fifth toes in the left foot--suggestive of underlying PAD. Pharmacy was originally consulted to dose Eliquis (  last dose 10/30 @1005 ) but was transitioned to IV Heparin on 08/08/23 in anticipation of lower extremity arteriogram (tentative plan for this Friday  08/10/23 at [facility]).  Today, 08/09/23 Current Heparin infusion rate is 1500 units/hr.  aPTT = 34 and HL is >1.10.   aPTT is subtherapeutic which we are using to adjust heparin rate due to prior recent apixaban effect on heparin levels.  Last received apixaban dose on 10/30 AM. Hgb 13.4, plts 306--stable Scr 1.40 >1.37 (seems to be ~baseline), CrCl ~60 ml/min No s/sx of bleeding reported  Avoid bolusing heparin given recent administration of Eliquis  Goal of Therapy:  Heparin level 0.3-0.7 units/ml aPTT 66-102 seconds Monitor platelets by anticoagulation protocol: Yes    Plan:  Increase Heparin drip to 1750 units/hr  Check aPTT and HL in 6 hours after rate increased.  Monitor heparin level, aPTT, CBC, and s/sx of bleeding  daily   Tanner Phillips, RPh Clinical Pharmacist 10/31/20241:56 PM

## 2023-08-09 NOTE — Progress Notes (Addendum)
  Progress Note    08/09/2023 7:46 AM * No surgery date entered *  Subjective:  no new concerns this morning. Does report increased pain in right foot as well and is hopeful both legs can be looked at tomorrow   Vitals:   08/09/23 0048 08/09/23 0440  BP: 135/86 129/65  Pulse: (!) 106 90  Resp: 19 17  Temp: 98.1 F (36.7 C) 98.2 F (36.8 C)  SpO2: 93% (!) 89%   Physical Exam: Cardiac:  tachy Lungs:  non labored Extremities:  ischemic changes to left heel and between left 4th and 5th toes. No palpable distal pulses Abdomen:  soft Neurologic: alert and oriented  CBC    Component Value Date/Time   WBC 9.5 08/08/2023 0430   RBC 4.85 08/08/2023 0430   HGB 13.7 08/08/2023 0430   HCT 42.4 08/08/2023 0430   PLT 298 08/08/2023 0430   MCV 87.4 08/08/2023 0430   MCH 28.2 08/08/2023 0430   MCHC 32.3 08/08/2023 0430   RDW 13.2 08/08/2023 0430   LYMPHSABS 1.2 08/06/2023 0927   MONOABS 1.3 (H) 08/06/2023 0927   EOSABS 0.1 08/06/2023 0927   BASOSABS 0.1 08/06/2023 0927    BMET    Component Value Date/Time   NA 136 08/08/2023 0430   K 3.9 08/08/2023 0430   CL 103 08/08/2023 0430   CO2 24 08/08/2023 0430   GLUCOSE 100 (H) 08/08/2023 0430   BUN 18 08/08/2023 0430   CREATININE 1.40 (H) 08/08/2023 0430   CALCIUM 8.3 (L) 08/08/2023 0430   GFRNONAA 54 (L) 08/08/2023 0430   GFRAA 52 (L) 05/21/2017 0010    INR    Component Value Date/Time   INR 1.6 (H) 02/16/2008 0525     Intake/Output Summary (Last 24 hours) at 08/09/2023 0746 Last data filed at 08/09/2023 0100 Gross per 24 hour  Intake 48.77 ml  Output 300 ml  Net -251.23 ml     Assessment/Plan:  67 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 I discussed that depending on the amount of contrast needed to examine the LLE we may need to image right at a later  date Reviewed procedure and answered his questions Keep NPO after midnight Consent ordered Plan is for Aortogram, Arteriogram of BLE with possible intervention on LLE tomorrow 11/1 with Dr. Erlene Senters, PA-C [facility] [phone] 08/09/2023 7:46 AM  I have seen and evaluated the patient. I agree with the PA note as documented above.  Seen in consultation yesterday at [facility].  Patient being treated for DVT in the left leg but ischemic ulcer between the left fourth and fifth toes with ischemic ulcer to the heel.  ABI severely depressed at 0.29.  Exam consistent with CLI with tissue loss.  Plan angiogram with lower extremity arteriogram and possible intervention with a focus on the left leg tomorrow in the Cath Lab.  Please continue to hold Eliquis.  Heparin bridge is fine.  N.p.o. after midnight and consent order placed.  Discussed he may require surgical bypass if he has no endovascular options.  Cephus Shelling, MD [facility] [place] Office: [phone]

## 2023-08-10 ENCOUNTER — Encounter (HOSPITAL_COMMUNITY)
Admission: EM | Disposition: A | Discharge status: Home Health | Payer: Self-pay | Source: Home / Self Care | Attending: Internal Medicine

## 2023-08-10 ENCOUNTER — Inpatient Hospital Stay (HOSPITAL_COMMUNITY): Payer: Medicare Other

## 2023-08-10 DIAGNOSIS — M79605 Pain in left leg: Secondary | ICD-10-CM | POA: Diagnosis not present

## 2023-08-10 DIAGNOSIS — L98499 Non-pressure chronic ulcer of skin of other sites with unspecified severity: Secondary | ICD-10-CM

## 2023-08-10 DIAGNOSIS — I771 Stricture of artery: Secondary | ICD-10-CM

## 2023-08-10 DIAGNOSIS — I70245 Atherosclerosis of native arteries of left leg with ulceration of other part of foot: Secondary | ICD-10-CM

## 2023-08-10 DIAGNOSIS — I70201 Unspecified atherosclerosis of native arteries of extremities, right leg: Secondary | ICD-10-CM

## 2023-08-10 DIAGNOSIS — L97529 Non-pressure chronic ulcer of other part of left foot with unspecified severity: Secondary | ICD-10-CM

## 2023-08-10 HISTORY — PX: ABDOMINAL AORTOGRAM W/LOWER EXTREMITY: CATH118223

## 2023-08-10 LAB — LIPID PANEL
Cholesterol: 146 mg/dL (ref 0–200)
HDL: 27 mg/dL — ABNORMAL LOW (ref >40)
LDL Cholesterol: 99 mg/dL (ref 0–99)
Total CHOL/HDL Ratio: 5.4 {ratio}
Triglycerides: 102 mg/dL (ref <150)
VLDL: 20 mg/dL (ref 0–40)

## 2023-08-10 LAB — CBC
HCT: 39.1 % (ref 39.0–52.0)
Hemoglobin: 12.8 g/dL — ABNORMAL LOW (ref 13.0–17.0)
MCH: 28.3 pg (ref 26.0–34.0)
MCHC: 32.7 g/dL (ref 30.0–36.0)
MCV: 86.3 fL (ref 80.0–100.0)
Platelets: 360 10*3/uL (ref 150–400)
RBC: 4.53 MIL/uL (ref 4.22–5.81)
RDW: 13.2 % (ref 11.5–15.5)
WBC: 10 10*3/uL (ref 4.0–10.5)
nRBC: 0 % (ref 0.0–0.2)

## 2023-08-10 LAB — BASIC METABOLIC PANEL
Anion gap: 9 (ref 5–15)
BUN: 12 mg/dL (ref 8–23)
CO2: 23 mmol/L (ref 22–32)
Calcium: 7.8 mg/dL — ABNORMAL LOW (ref 8.9–10.3)
Chloride: 107 mmol/L (ref 98–111)
Creatinine, Ser: 1.32 mg/dL — ABNORMAL HIGH (ref 0.61–1.24)
GFR, Estimated: 58 mL/min — ABNORMAL LOW (ref >60)
Glucose, Bld: 102 mg/dL — ABNORMAL HIGH (ref 70–99)
Potassium: 3.8 mmol/L (ref 3.5–5.1)
Sodium: 139 mmol/L (ref 135–145)

## 2023-08-10 LAB — ANTITHROMBIN III: AntiThromb III Func: 66 % — ABNORMAL LOW (ref 75–120)

## 2023-08-10 LAB — HEPARIN LEVEL (UNFRACTIONATED): Heparin Unfractionated: 0.94 [IU]/mL — ABNORMAL HIGH (ref 0.30–0.70)

## 2023-08-10 LAB — APTT: aPTT: 41 s — ABNORMAL HIGH (ref 24–36)

## 2023-08-10 LAB — HEMOGLOBIN A1C
Hgb A1c MFr Bld: 5.8 % — ABNORMAL HIGH (ref 4.8–5.6)
Mean Plasma Glucose: 119.76 mg/dL

## 2023-08-10 SURGERY — ABDOMINAL AORTOGRAM W/LOWER EXTREMITY
Anesthesia: LOCAL | Laterality: Bilateral

## 2023-08-10 MED ORDER — IODIXANOL 320 MG/ML IV SOLN
INTRAVENOUS | Status: DC | PRN
  Administered 2023-08-10: 95 mL

## 2023-08-10 MED ORDER — FENTANYL CITRATE (PF) 100 MCG/2ML IJ SOLN
INTRAMUSCULAR | Status: AC
  Filled 2023-08-10: qty 2

## 2023-08-10 MED ORDER — FENTANYL CITRATE (PF) 100 MCG/2ML IJ SOLN
INTRAMUSCULAR | Status: DC | PRN
  Administered 2023-08-10: 50 ug via INTRAVENOUS

## 2023-08-10 MED ORDER — MIDAZOLAM HCL 2 MG/2ML IJ SOLN
INTRAMUSCULAR | Status: DC | PRN
  Administered 2023-08-10: 1 mg via INTRAVENOUS

## 2023-08-10 MED ORDER — ATORVASTATIN CALCIUM 40 MG PO TABS
40.0000 mg | ORAL_TABLET | Freq: Every day | ORAL | Status: DC
  Administered 2023-08-10 – 2023-08-21 (×11): 40 mg via ORAL
  Filled 2023-08-10 (×11): qty 1

## 2023-08-10 MED ORDER — SODIUM CHLORIDE 0.9% FLUSH
3.0000 mL | INTRAVENOUS | Status: DC | PRN
  Administered 2023-08-12: 3 mL via INTRAVENOUS

## 2023-08-10 MED ORDER — LABETALOL HCL 5 MG/ML IV SOLN
10.0000 mg | INTRAVENOUS | Status: DC | PRN
Start: 2023-08-10 — End: 2023-08-21

## 2023-08-10 MED ORDER — HEPARIN (PORCINE) 25000 UT/250ML-% IV SOLN
2650.0000 [IU]/h | INTRAVENOUS | Status: DC
  Administered 2023-08-10: 2300 [IU]/h via INTRAVENOUS
  Administered 2023-08-11 – 2023-08-12 (×3): 2650 [IU]/h via INTRAVENOUS
  Filled 2023-08-10 (×6): qty 250

## 2023-08-10 MED ORDER — LIDOCAINE HCL (PF) 1 % IJ SOLN
INTRAMUSCULAR | Status: DC | PRN
  Administered 2023-08-10: 18 mL

## 2023-08-10 MED ORDER — MIDAZOLAM HCL 2 MG/2ML IJ SOLN
INTRAMUSCULAR | Status: AC
  Filled 2023-08-10: qty 2

## 2023-08-10 MED ORDER — SODIUM CHLORIDE 0.9 % WEIGHT BASED INFUSION
1.0000 mL/kg/h | INTRAVENOUS | Status: AC
Start: 2023-08-10 — End: 2023-08-10

## 2023-08-10 MED ORDER — LIDOCAINE HCL (PF) 1 % IJ SOLN
INTRAMUSCULAR | Status: AC
  Filled 2023-08-10: qty 30

## 2023-08-10 MED ORDER — ACETAMINOPHEN 325 MG PO TABS
650.0000 mg | ORAL_TABLET | ORAL | Status: DC | PRN
Start: 2023-08-10 — End: 2023-08-11

## 2023-08-10 MED ORDER — ASPIRIN 81 MG PO TBEC
81.0000 mg | DELAYED_RELEASE_TABLET | Freq: Every day | ORAL | Status: DC
  Administered 2023-08-10 – 2023-08-21 (×11): 81 mg via ORAL
  Filled 2023-08-10 (×11): qty 1

## 2023-08-10 MED ORDER — SODIUM CHLORIDE 0.9 % IV SOLN
250.0000 mL | INTRAVENOUS | Status: AC | PRN
Start: 2023-08-10 — End: 2023-08-11

## 2023-08-10 MED ORDER — HYDRALAZINE HCL 20 MG/ML IJ SOLN: 5.0000 mg | INTRAMUSCULAR | Status: DC | PRN

## 2023-08-10 MED ORDER — HEPARIN (PORCINE) IN NACL 1000-0.9 UT/500ML-% IV SOLN
INTRAVENOUS | Status: DC | PRN
  Administered 2023-08-10 (×2): 500 mL

## 2023-08-10 MED ORDER — SODIUM CHLORIDE 0.9% FLUSH
3.0000 mL | Freq: Two times a day (BID) | INTRAVENOUS | Status: DC
  Administered 2023-08-10 – 2023-08-21 (×14): 3 mL via INTRAVENOUS

## 2023-08-10 SURGICAL SUPPLY — 7 items
CATH OMNI FLUSH 5F 65CM (CATHETERS) IMPLANT
COVER DOME SNAP 22 D (MISCELLANEOUS) IMPLANT
KIT MICROPUNCTURE NIT STIFF (SHEATH) IMPLANT
SET ATX-X65L (MISCELLANEOUS) IMPLANT
SHEATH PINNACLE 5F 10CM (SHEATH) IMPLANT
TRAY PV CATH (CUSTOM PROCEDURE TRAY) ×1 IMPLANT
WIRE BENTSON .035X145CM (WIRE) IMPLANT

## 2023-08-10 NOTE — Progress Notes (Signed)
Sheath Removal 91f femoral sheath removed Manual pressure held for 25 min VVS thoughout Bedrest starts at 1145am Dressing applied CDI  Malva Limes RN

## 2023-08-10 NOTE — Progress Notes (Signed)
Vein mapping  has been completed. Refer to [facility] under chart review to view preliminary results.   08/10/2023  3:44 PM Tanner Phillips, Gerarda Gunther

## 2023-08-10 NOTE — Progress Notes (Addendum)
PHARMACY - ANTICOAGULATION CONSULT NOTE  Pharmacy Consult for Heparin Indication:  acute LLE DVT  No Known Allergies  Patient Measurements: Height: 5\' 5"  (165.1 cm) Weight: 123 kg (271 lb 2.7 oz) IBW/kg (Calculated) : 61.5 Heparin Dosing Weight: 90 kg  Vital Signs: Temp: 98.4 F (36.9 C) (11/01 0750) Temp Source: Oral (11/01 0750) BP: 133/72 (11/01 1330) Pulse Rate: 100 (11/01 1515)  Labs: Recent Labs    08/08/23 0430 08/08/23 2308 08/09/23 0845 08/09/23 1039 08/09/23 2114 08/10/23 0558  HGB 13.7  --  13.4  --   --  12.8*  HCT 42.4  --  42.0  --   --  39.1  PLT 298  --  306  --   --  360  APTT  --    < > 34  --  38* 41*  HEPARINUNFRC  --   --  >1.10*  --  >1.10* 0.94*  CREATININE 1.40*  --   --  1.37*  --  1.32*   < > = values in this interval not displayed.    Estimated Creatinine Clearance: 64.3 mL/min (A) (by C-G formula based on SCr of 1.32 mg/dL (H)).   Assessment: 67 YO male presenting 10/28 with a new LLE DVT nvolving the left femoral vein, popliteal vein as well as posterior tibial vein and peroneal vein.  There is also SVT involving the small saphenous vein as well as intramuscular thrombosis involving the left gastrocnemius vein and left soleus vein.  He also has ischemic changes on the left heel with ulceration as well as ischemic changes with ulceration between the fourth and fifth toes in the left foot--suggestive of underlying PAD. Pharmacy was originally consulted to dose Eliquis (last dose 10/30 @1005 ) but was transitioned to IV Heparin on 08/08/23 in anticipation of lower extremity arteriogram (tentative plan for this Friday 08/10/23 at [facility]).   08/10/23:   AM PTT = 41 (goal 66-102), HL 0.94, on 2000 units/hr.  Subtherapeutic aPTT this AM.  Last dose of apixaban given 10/30AM,  apixaban is effecting (elevating) HL,thus  we will continue to use aPTT to monitor until HL and aPTT correlate. Hgb 13.4>12.8, pltc wnl stable.  No bleeding reported  BMI  - 45 Heparin drip was stopped on way to OR today 08/10/23.   Now s/p LLE angiogram  and pharmacy consulted to restart IV heparin 8 hours after sheath removal.  Sheath removed 11:44.  No bleeding reported Plan for left fem-tib artery bypass on 11/4 Monday.   Goal of Therapy:  Heparin level 0.3-0.7 units/ml aPTT 66-102 seconds Monitor platelets by anticoagulation protocol: Yes    Plan:  At 19:45 tonight, restart heparin drip at 2300 units/hr  Will f/u aPTT in 8 hours Daily aPTT , HL and CBC   Thank you for allowing pharmacy to be part of this patients care team.  Tanner Phillips, RPh Clinical Pharmacist  Please see amion for complete clinical pharmacist phone list 11/1/20243:28 PM

## 2023-08-10 NOTE — Progress Notes (Signed)
  Progress Note    08/10/2023 7:25 AM * No surgery date entered *  CLI with rest pain and tissue loss of left foot  Scheduled for Aortogram, arteriogram LLE with Dr. Lenell Antu Continue to hold Eliquis Heparin can be held on way to OR Keep NPO Consent order placed  Dory Horn [facility] [phone] 08/10/2023 7:25 AM

## 2023-08-10 NOTE — Care Management (Signed)
Transition of Care [facility]) - Inpatient Brief Assessment   Patient Details  Name: Tanner Phillips MRN: [id number] Date of Birth: [birth year]  [facility] of [facility] CM/SW Contact:    Lockie Pares, RN Phone Number: 08/10/2023, 4:12 PM   Clinical Narrative:  67 yo patient presented with foot wounds and multiple falls. Attempted to contact patient left confidential VM to call this RNCM back regarding DC planning., recommended Home Health. [facility] has no previous HH listed. Would likely need PT OT and RN for wound.   TOC will continue to follow  Transition of Care Asessment: Insurance and Status: Insurance coverage has been reviewed Patient has primary care physician: Yes Home environment has been reviewed: Y Prior level of function:: Ind Prior/Current Home Services: No current home services Social Determinants of Health Reivew: SDOH reviewed no interventions necessary Readmission risk has been reviewed: Yes Transition of care needs: transition of care needs identified, TOC will continue to follow

## 2023-08-10 NOTE — Op Note (Addendum)
DATE OF SERVICE: 08/10/2023  PATIENT:  Tanner Phillips  67 y.o. male  PRE-OPERATIVE DIAGNOSIS:  Atherosclerosis of native arteries of left lower extremity causing ulceration  POST-OPERATIVE DIAGNOSIS:  Same  PROCEDURE:   1) Ultrasound guided right common femoral artery access 2) Aortogram 3) Left lower extremity angiogram with second order cannulation (95mL total contrast) 4) Conscious sedation (20 minutes)  SURGEON:  Rande Brunt. Lenell Antu, MD  ASSISTANT: none  ANESTHESIA:   local and IV sedation  ESTIMATED BLOOD LOSS: minimal  LOCAL MEDICATIONS USED:  LIDOCAINE   COUNTS: confirmed correct.  PATIENT DISPOSITION:  PACU - hemodynamically stable.   Delay start of Pharmacological VTE agent (>24hrs) due to surgical blood loss or risk of bleeding: no  INDICATION FOR PROCEDURE: HARL VANDERKOOI is a 67 y.o. male with left foot ulceration and non-invasive evidence of peripheral arterial disease. After careful discussion of risks, benefits, and alternatives the patient was offered angiography. The patient understood and wished to proceed.  OPERATIVE FINDINGS:  Renal arteries patent bilaterally. Infrarenal aorta patent Iliac arteries patent bilaterally.  Highly tortuous   Right lower extremity: Common femoral artery: Patent Profunda femoris artery: Patent Superficial femoral artery: Occluded Popliteal artery: Occluded Anterior tibial artery:  reconstitutes just distal to origin, occludes in mid calf Tibioperoneal trunk: occludes Peroneal artery: reconstitutes just distal to origin, flows to ankle where it arborizes Posterior tibial artery: reconstitutes just distal to origin, occludes in mid calf  Left lower extremity: Common femoral artery: Patent Profunda femoris artery: Patent Superficial femoral artery: Occluded Popliteal artery: Occluded Anterior tibial artery: Reconstitutes at the ankle, and fills the dorsalis pedis, which fills the pedal circulation. Tibioperoneal trunk:  Occluded Peroneal artery: Occluded Posterior tibial artery: Reconstitutes in the calf, close to the ankle where it occludes Pedal circulation: Fills via DP  DESCRIPTION OF PROCEDURE: After identification of the patient in the pre-operative holding area, the patient was transferred to the operating room. The patient was positioned supine on the operating room table.  Anesthesia was induced. The groins was prepped and draped in standard fashion. A surgical pause was performed confirming correct patient, procedure, and operative location.  The right groin was anesthetized with subcutaneous injection of 1% lidocaine. Using ultrasound guidance, the right common femoral artery was accessed with micropuncture technique. A permanent picture was saved for our records. Fluoroscopy was used to confirm cannulation over the femoral head. The 37F sheath was upsized to 55F.   A Benson wire was advanced into the distal aorta. Over the wire an omni flush catheter was advanced to the level of L2. Aortogram was performed - see above for details. Bilateral lower extremity runoff angiography was performed - see above for details.  The left common iliac artery was selected with an omniflush catheter and benson guidewire. The wire was advanced into the common femoral artery. Over the wire the omni flush catheter was advanced into the left external iliac artery. Selective angiography was performed - see above for details.   The sheath was left in place to be removed in the recovery area.  Conscious sedation was administered with the use of IV fentanyl and midazolam under continuous physician and nurse monitoring.  Heart rate, blood pressure, and oxygen saturation were continuously monitored.  Total sedation time was 20 minutes  Upon completion of the case instrument and sharps counts were confirmed correct. The patient was transferred to the PACU in good condition. I was present for all portions of the procedure.  PLAN:  Left common femoral to  distal anterior tibial artery bypass on Monday. Co-case with Dr. Chestine Spore. Vein mapping today.   Rande Brunt. Lenell Antu, MD [facility] Phone Number: [phone] 08/10/2023 10:48 AM

## 2023-08-10 NOTE — Plan of Care (Signed)

## 2023-08-10 NOTE — Progress Notes (Signed)
PROGRESS NOTE    Tanner Phillips  ZOX:[id number] DOB: November 09, 1955 DOA: 08/06/2023 PCP: Ralene Ok, MD   Brief Narrative: 67 year old with past medical history significant for hypertension, lumbar disc disease, CAD status post MI, morbid obesity presents complaining of left leg pain ongoing for the last 10 days.  He also noted to have a lesion on the left heel as well as bilateral great toe.  He has also ulcers  in the intertriginous area bilaterally between fourth and fifth toes.  This has been progressively getting worse.  Patient presented with worsening left lower extremity pain redness and edema.  Patient was found to have left lower extremity DVT he was started on anticoagulation, he has been getting antibiotics for cellulitis as well.  ABIs were obtained on 10/30: Consistent with severe right and left lower extremity arterial disease. Vascular surgery consulted, he was transfer to [facility].  Plan is for angiography on Friday.      Assessment & Plan:   Principal Problem:   DVT, lower extremity (HCC) Active Problems:   Cellulitis   Radiculopathy   Essential hypertension   Renal insufficiency   PAD (peripheral artery disease) (HCC)  1-Acute left lower extremity DVT: Lower extremity Doppler show acute DVT involving the left femoral vein, popliteal vein as well as posterior tibial vein and peroneal vein.  There is also SVT involving the small saphenous vein as well as intramuscular thrombosis involving the left gastrocnemius vein and left soleus vein. -He has been more sedentary over last 10 day.  - factor V leyden, Lupus anticoagulant, Antithrombin. Rest hypercoagulable panel out patient. Advised to follow up with PCP appropriate screening for age for malignancy.  -plan to resume heparin gtt 8 hours after Cath.   2-Critical limb ischemia Severe PAD bilaterally ABIs are 0.2 on the right and 0.9 on the left He has lesions that appears to be ischemic lesions primarily on the left  leg. Vascular surgery consulted. Eliquis changed to heparin for angiography on Friday Lipid panel pending. and A1c 5.8 pre diabetic. .  S/P arteriogram, he will need left common femoral to distal anterior tibial artery bypass on Monday  Left leg cellulitis Bilateral fourth and fifth toe intertriginous ulceration suspect related to vascular disease continue cefadroxil to complete 7-day Wound care consulted.   AKI versus CKD 3 A Five year agio Cr 1.5 GFR 45. Monitor renal function.   Chronic back pain with a spondylolisthesis Follow up with Dr Lovell Sheehan out patient.  He might have to delay steroid injection.   Morbid obesity: need life style modification.   Hypertension Continue with Norvasc.   Hyperlipoidemia Check lipid panel.    Estimated body mass index is 45.12 kg/m as calculated from the following:   Height as of this encounter: 5\' 5"  (1.651 m).   Weight as of this encounter: 123 kg.   DVT prophylaxis: heparin gtt Code Status: Full code Family Communication: family at bedside.  Disposition Plan:  Status is: Inpatient Remains inpatient appropriate because: management of DVT and critical limb ischemia.     Consultants:  Vascular.   Procedures:  Doppler;  ABI  Antimicrobials:    Subjective: Seen in the holding area Cath Lab, waiting for a room.  He will be transferred to 6 E. He is feeling very uncomfortable in his current bed and position Objective: Vitals:   08/10/23 1043 08/10/23 1048 08/10/23 1053 08/10/23 1102  BP: 132/75 (!) 141/70 124/77 122/72  Pulse: (!) 108 (!) 109 (!) 108 (!) 103  Resp: (!) 24 (!) 22 (!) 23 (!) 31  Temp:      TempSrc:      SpO2: 90% 91% 91% 93%  Weight:      Height:       No intake or output data in the 24 hours ending 08/10/23 1128  Filed Weights   08/06/23 0833 08/09/23 0325  Weight: 120.7 kg 123 kg    Examination:  General exam: NAD Respiratory system: CTA Cardiovascular system: S 1, S 2  RRR Gastrointestinal  system: BS present, soft, nt Central nervous system: alert Extremities: left LE with dark discoloration heel, and discoloration between 4th and 5th toes.     Data Reviewed: I have personally reviewed following labs and imaging studies  CBC: Recent Labs  Lab 08/06/23 0927 08/07/23 0445 08/08/23 0430 08/09/23 0845 08/10/23 0558  WBC 12.8* 12.0* 9.5 9.2 10.0  NEUTROABS 10.0*  --   --   --   --   HGB 14.2 14.1 13.7 13.4 12.8*  HCT 43.6 44.1 42.4 42.0 39.1  MCV 87.9 88.6 87.4 85.5 86.3  PLT 240 257 298 306 360   Basic Metabolic Panel: Recent Labs  Lab 08/06/23 0927 08/07/23 0445 08/08/23 0430 08/09/23 1039 08/10/23 0558  NA 136 136 136 139 139  K 4.0 3.8 3.9 3.7 3.8  CL 102 103 103 107 107  CO2 24 22 24  20* 23  GLUCOSE 103* 96 100* 91 102*  BUN 17 17 18 13 12   CREATININE 1.39* 1.42* 1.40* 1.37* 1.32*  CALCIUM 8.4* 8.4* 8.3* 8.1* 7.8*   GFR: Estimated Creatinine Clearance: 64.3 mL/min (A) (by C-G formula based on SCr of 1.32 mg/dL (H)). Liver Function Tests: No results for input(s): "AST", "ALT", "ALKPHOS", "BILITOT", "PROT", "ALBUMIN" in the last 168 hours. No results for input(s): "LIPASE", "AMYLASE" in the last 168 hours. No results for input(s): "AMMONIA" in the last 168 hours. Coagulation Profile: No results for input(s): "INR", "PROTIME" in the last 168 hours. Cardiac Enzymes: No results for input(s): "CKTOTAL", "CKMB", "CKMBINDEX", "TROPONINI" in the last 168 hours. BNP (last 3 results) No results for input(s): "PROBNP" in the last 8760 hours. HbA1C: Recent Labs    08/10/23 0558  HGBA1C 5.8*   CBG: No results for input(s): "GLUCAP" in the last 168 hours. Lipid Profile: Recent Labs    08/10/23 0558  CHOL 146  HDL 27*  LDLCALC 99  TRIG 161  CHOLHDL 5.4   Thyroid Function Tests: No results for input(s): "TSH", "T4TOTAL", "FREET4", "T3FREE", "THYROIDAB" in the last 72 hours. Anemia Panel: No results for input(s): "VITAMINB12", "FOLATE", "FERRITIN",  "TIBC", "IRON", "RETICCTPCT" in the last 72 hours. Sepsis Labs: Recent Labs  Lab 08/06/23 1532 08/06/23 1743  LATICACIDVEN 1.0 1.0    No results found for this or any previous visit (from the past 240 hour(s)).       Radiology Studies: PERIPHERAL VASCULAR CATHETERIZATION  Result Date: 08/10/2023 DATE OF SERVICE: 08/10/2023  PATIENT:  [name]  67 y.o. male  PRE-OPERATIVE DIAGNOSIS:  Atherosclerosis of native arteries of left lower extremity causing ulceration  POST-OPERATIVE DIAGNOSIS:  Same  PROCEDURE:  1) Ultrasound guided right common femoral artery access 2) Aortogram 3) Left lower extremity angiogram with second order cannulation (95mL total contrast) 4) Conscious sedation (20 minutes)  SURGEON:  Rande Brunt. Lenell Antu, MD  ASSISTANT: none  ANESTHESIA:   local and IV sedation  ESTIMATED BLOOD LOSS: minimal  LOCAL MEDICATIONS USED:  LIDOCAINE  COUNTS: confirmed correct.  PATIENT DISPOSITION:  PACU -  hemodynamically stable.  Delay start of Pharmacological VTE agent (>24hrs) due to surgical blood loss or risk of bleeding: no  INDICATION FOR PROCEDURE: JHAN CONERY is a 67 y.o. male with left foot ulceration and non-invasive evidence of peripheral arterial disease. After careful discussion of risks, benefits, and alternatives the patient was offered angiography. The patient understood and wished to proceed.  OPERATIVE FINDINGS: Renal arteries patent bilaterally. Infrarenal aorta patent Iliac arteries patent bilaterally.  Highly tortuous  Right lower extremity: Common femoral artery: Patent Profunda femoris artery: Patent Superficial femoral artery: Occluded Popliteal artery: Occluded Anterior tibial artery:  reconstitutes just distal to origin, occludes in mid calf Tibioperoneal trunk: occludes Peroneal artery: reconstitutes just distal to origin, flows to ankle where it arborizes Posterior tibial artery: reconstitutes just distal to origin, occludes in mid calf  Left lower extremity: Common femoral  artery: Patent Profunda femoris artery: Patent Superficial femoral artery: Occluded Popliteal artery: Occluded Anterior tibial artery: Reconstitutes at the ankle, and fills the dorsalis pedis, which fills the pedal circulation. Tibioperoneal trunk: Occluded Peroneal artery: Occluded Posterior tibial artery: Reconstitutes in the calf, close to the ankle where it occludes Pedal circulation: Fills via DP  DESCRIPTION OF PROCEDURE: After identification of the patient in the pre-operative holding area, the patient was transferred to the operating room. The patient was positioned supine on the operating room table.  Anesthesia was induced. The groins was prepped and draped in standard fashion. A surgical pause was performed confirming correct patient, procedure, and operative location.  The right groin was anesthetized with subcutaneous injection of 1% lidocaine. Using ultrasound guidance, the right common femoral artery was accessed with micropuncture technique. Fluoroscopy was used to confirm cannulation over the femoral head. The 560F sheath was upsized to 60F.  A Benson wire was advanced into the distal aorta. Over the wire an omni flush catheter was advanced to the level of L2. Aortogram was performed - see above for details. Bilateral lower extremity runoff angiography was performed - see above for details.  The left common iliac artery was selected with an omniflush catheter and benson guidewire. The wire was advanced into the common femoral artery. Over the wire the omni flush catheter was advanced into the external iliac artery. Selective angiography was performed - see above for details.  The sheath was left in place to be removed in the recovery area.  Conscious sedation was administered with the use of IV fentanyl and midazolam under continuous physician and nurse monitoring.  Heart rate, blood pressure, and oxygen saturation were continuously monitored.  Total sedation time was 20 minutes  Upon completion of the  case instrument and sharps counts were confirmed correct. The patient was transferred to the PACU in good condition. I was present for all portions of the procedure.  PLAN: Left common femoral to distal anterior tibial artery bypass on Monday. Co-case with Dr. Chestine Spore. Vein mapping today.  Rande Brunt. Lenell Antu, MD [facility] Phone Number: [phone] 08/10/2023 10:48 AM   CT HEAD WO CONTRAST ( )  Result Date: 08/09/2023 CLINICAL DATA:  Initial evaluation for acute head trauma, minor. Rate EXAM: CT HEAD WITHOUT CONTRAST TECHNIQUE: Contiguous axial images were obtained from the base of the skull through the vertex without intravenous contrast. RADIATION DOSE REDUCTION: This exam was performed according to the departmental dose-optimization program which includes automated exposure control, adjustment of the mA and/or kV according to patient size and/or use of iterative reconstruction technique. COMPARISON:  None Available. FINDINGS: Brain:  Examination mildly degraded by motion artifact. Cerebral volume within normal limits. No acute intracranial hemorrhage. No acute large vessel territory infarct. No mass lesion, mass effect or midline shift. No hydrocephalus or extra-axial fluid collection. Vascular: No abnormal hyperdense vessel. Skull: No acute scalp soft tissue abnormality.  Calvarium intact. Sinuses/Orbits: Globes and orbital soft tissues within normal limits. Paranasal sinuses and mastoid air cells are largely clear. Other: None. IMPRESSION: Normal head CT.  No acute intracranial abnormality. Electronically Signed   By: Rise Mu M.D.   On: 08/09/2023 19:20   VAS [place] ABI WITH/WO TBI  Result Date: 08/08/2023  LOWER EXTREMITY DOPPLER STUDY Patient Name:  DYSHON PHILBIN  Date of Exam:   08/08/2023 Medical Rec #: [id number]     Accession #:    [id number] Date of Birth: [birth year]      Patient Gender: M Patient Age:   67 years Exam Location:  [facility]  Procedure:      VAS [place] ABI WITH/WO TBI Referring Phys: PRANAV PATEL --------------------------------------------------------------------------------  Indications: Ulceration, and gangrene. High Risk Factors: Hypertension.  Limitations: Today's exam was limited due to an open wound, involuntary patient              movement, bandages and patient intolerant to cuff pressure. Comparison Study: No prior studies. Performing Technologist: Olen Cordial RVT  Examination Guidelines: A complete evaluation includes at minimum, Doppler waveform signals and systolic blood pressure reading at the level of bilateral brachial, anterior tibial, and posterior tibial arteries, when vessel segments are accessible. Bilateral testing is considered an integral part of a complete examination. Photoelectric Plethysmograph (PPG) waveforms and toe systolic pressure readings are included as required and additional duplex testing as needed. Limited examinations for reoccurring indications may be performed as noted.  ABI Findings: +--------+------------------+-----+----------+--------+ Right   Rt Pressure (mmHg)IndexWaveform  Comment  +--------+------------------+-----+----------+--------+ [id number]                    triphasic          +--------+------------------+-----+----------+--------+ PTA     17                0.14 monophasic         +--------+------------------+-----+----------+--------+ DP      34                0.27 monophasic         +--------+------------------+-----+----------+--------+ +--------+------------------+-----+----------+-------+ Left    Lt Pressure (mmHg)IndexWaveform  Comment +--------+------------------+-----+----------+-------+ [id number]                    triphasic         +--------+------------------+-----+----------+-------+ PTA     36                0.29 monophasic        +--------+------------------+-----+----------+-------+ DP      28                0.22  monophasic        +--------+------------------+-----+----------+-------+ +-------+-----------+-----------+------------+------------+ ABI/TBIToday's ABIToday's TBIPrevious ABIPrevious TBI +-------+-----------+-----------+------------+------------+ Right  0.27                                           +-------+-----------+-----------+------------+------------+ Left   0.29                                           +-------+-----------+-----------+------------+------------+  Summary: Right: Resting right ankle-brachial index indicates severe right lower extremity arterial disease. Left: Resting left ankle-brachial index indicates severe left lower extremity arterial disease. *See table(s) above for measurements and observations.  Electronically signed by Sherald Hess MD on 08/08/2023 at 3:58:32 PM.    Final         Scheduled Meds:  [MAR Hold] amLODipine  10 mg Oral Daily   [MAR Hold] cefadroxil  500 mg Oral BID   [MAR Hold] gabapentin  300 mg Oral BID   [MAR Hold] zolpidem  5 mg Oral QHS   Continuous Infusions:  sodium chloride 100 mL/hr at 08/09/23 1254   heparin Stopped (08/10/23 0552)     LOS: 2 days    Time spent: 35 minutes    Krishika Bugge A [place], MD [facility]   If 7PM-7AM, please contact night-coverage [email or web address]  08/10/2023, 11:28 AM

## 2023-08-11 DIAGNOSIS — M79605 Pain in left leg: Secondary | ICD-10-CM | POA: Diagnosis not present

## 2023-08-11 DIAGNOSIS — I70244 Atherosclerosis of native arteries of left leg with ulceration of heel and midfoot: Secondary | ICD-10-CM | POA: Diagnosis not present

## 2023-08-11 LAB — CBC
HCT: 38.2 % — ABNORMAL LOW (ref 39.0–52.0)
Hemoglobin: 12.5 g/dL — ABNORMAL LOW (ref 13.0–17.0)
MCH: 27.9 pg (ref 26.0–34.0)
MCHC: 32.7 g/dL (ref 30.0–36.0)
MCV: 85.3 fL (ref 80.0–100.0)
Platelets: 359 10*3/uL (ref 150–400)
RBC: 4.48 MIL/uL (ref 4.22–5.81)
RDW: 13.2 % (ref 11.5–15.5)
WBC: 11 10*3/uL — ABNORMAL HIGH (ref 4.0–10.5)
nRBC: 0 % (ref 0.0–0.2)

## 2023-08-11 LAB — LUPUS ANTICOAGULANT PANEL
DRVVT: 74.3 s — ABNORMAL HIGH (ref 0.0–47.0)
PTT Lupus Anticoagulant: 48.9 s — ABNORMAL HIGH (ref 0.0–43.5)

## 2023-08-11 LAB — LIPID PANEL
Cholesterol: 150 mg/dL (ref 0–200)
HDL: 27 mg/dL — ABNORMAL LOW (ref >40)
LDL Cholesterol: 106 mg/dL — ABNORMAL HIGH (ref 0–99)
Total CHOL/HDL Ratio: 5.6 {ratio}
Triglycerides: 85 mg/dL (ref <150)
VLDL: 17 mg/dL (ref 0–40)

## 2023-08-11 LAB — HEXAGONAL PHASE PHOSPHOLIPID: Hexagonal Phase Phospholipid: 0 s (ref 0–11)

## 2023-08-11 LAB — DRVVT MIX: dRVVT Mix: 48.6 s — ABNORMAL HIGH (ref 0.0–40.4)

## 2023-08-11 LAB — HEPARIN LEVEL (UNFRACTIONATED): Heparin Unfractionated: 0.42 [IU]/mL (ref 0.30–0.70)

## 2023-08-11 LAB — APTT
aPTT: 43 s — ABNORMAL HIGH (ref 24–36)
aPTT: 66 s — ABNORMAL HIGH (ref 24–36)
aPTT: 71 s — ABNORMAL HIGH (ref 24–36)

## 2023-08-11 LAB — DRVVT CONFIRM: dRVVT Confirm: 1 {ratio} (ref 0.8–1.2)

## 2023-08-11 LAB — PTT-LA MIX: PTT-LA Mix: 48.2 s — ABNORMAL HIGH (ref 0.0–40.5)

## 2023-08-11 MED ORDER — HYDROCODONE-ACETAMINOPHEN 5-325 MG PO TABS
1.0000 | ORAL_TABLET | Freq: Three times a day (TID) | ORAL | Status: DC | PRN
  Administered 2023-08-12 – 2023-08-13 (×4): 1 via ORAL
  Filled 2023-08-11 (×4): qty 1

## 2023-08-11 MED ORDER — HYDROCODONE-ACETAMINOPHEN 10-325 MG PO TABS
1.0000 | ORAL_TABLET | ORAL | Status: DC | PRN
  Administered 2023-08-12 – 2023-08-21 (×20): 1 via ORAL
  Filled 2023-08-11 (×20): qty 1

## 2023-08-11 NOTE — Progress Notes (Signed)
Subjective  - POD #1, s/p angio  C/o left foot pain   Physical Exam:  Progression of chronic ischemic changes to left toes 4and 5 Groin canulation site soft  --------------+-----------+----------------------+---------------+-------  ----+   RT Diameter  RT Findings         GSV            LT Diameter  LT  Findings      (cm)                                            (cm)                    +---------------+-----------+----------------------+---------------+-------  ----+      0.65                     Saphenofemoral         0.84                                                     Junction                                    +---------------+-----------+----------------------+---------------+-------  ----+      0.43                     Proximal thigh         0.53                    +---------------+-----------+----------------------+---------------+-------  ----+      0.35                       Mid thigh            0.56        branching   +---------------+-----------+----------------------+---------------+-------  ----+      0.49       branching      Distal thigh          0.55                    +---------------+-----------+----------------------+---------------+-------  ----+      0.37                          Knee              0.50                    +---------------+-----------+----------------------+---------------+-------  ----+      0.46                       Prox calf            0.46                    +---------------+-----------+----------------------+---------------+-------  ----+      0.38                        Mid calf  0.44                    +---------------+-----------+----------------------+---------------+-------  ----+      0.37                      Distal calf           0.37                    +---------------+-----------+----------------------+---------------+-------   ----+      0.40                         Ankle              0.39                    +---------------+-----------+----------------------+---------------+-------  ----+       Assessment/Plan:  POD #1  Plan for left fem-distal bypass on Monday.  He has adequate vein for bypass Discussed potential of toe amputation, but whill see who his toes demarcate after revascularization Continue IV heparin NPO after midnight Sunday night  Wells Shonte Beutler 08/11/2023 4:02 PM --  Vitals:   08/11/23 1011 08/11/23 1242  BP: 109/69 (!) 140/78  Pulse:    Resp:  18  Temp:  98.7 F (37.1 C)  SpO2:      Intake/Output Summary (Last 24 hours) at 08/11/2023 1602 Last data filed at 08/11/2023 1500 Gross per 24 hour  Intake 476.72 ml  Output --  Net 476.72 ml     Laboratory CBC    Component Value Date/Time   WBC 11.0 (H) 08/11/2023 0344   HGB 12.5 (L) 08/11/2023 0344   HCT 38.2 (L) 08/11/2023 0344   PLT 359 08/11/2023 0344    BMET    Component Value Date/Time   NA 139 08/10/2023 0558   K 3.8 08/10/2023 0558   CL 107 08/10/2023 0558   CO2 23 08/10/2023 0558   GLUCOSE 102 (H) 08/10/2023 0558   BUN 12 08/10/2023 0558   CREATININE 1.32 (H) 08/10/2023 0558   CALCIUM 7.8 (L) 08/10/2023 0558   GFRNONAA 58 (L) 08/10/2023 0558   GFRAA 52 (L) 05/21/2017 0010    COAG Lab Results  Component Value Date   INR 1.6 (H) 02/16/2008   INR 1.5 02/15/2008   INR 1.4 02/14/2008   No results found for: "PTT"  Antibiotics Anti-infectives (From admission, onward)    Start     Dose/Rate Route Frequency Ordered Stop   08/08/23 1000  cefadroxil (DURICEF) capsule 500 mg        50 0 mg Oral 2 times daily 08/07/23 1852 08/10/23 2108   08/06/23 1600  ceFAZolin (ANCEF) IVPB 2g/100 mL premix        2 g 200 mL/hr over 30 Minutes Intravenous Every 8 hours 08/06/23 1455 08/07/23 1626   08/06/23 1445  vancomycin (VANCOREADY) IVPB 2000 mg/400 mL       Placed in "And" Linked Group   2,000 mg 200  mL/hr over 120 Minutes Intravenous  Once 08/06/23 1432 08/06/23 1822   08/06/23 1445  vancomycin (VANCOREADY) IVPB 500 mg/100 mL       Placed in "And" Linked Group   500 mg 100 mL/hr over 60 Minutes Intravenous  Once 08/06/23 1432 08/06/23 1723        V. Charlena Cross, M.D., [facility] [place] Office: [phone] Pager:  [phone]

## 2023-08-11 NOTE — Progress Notes (Signed)
OT Cancellation Note  Patient Details Name: Tanner Phillips MRN: [id number] DOB: 1955/10/11   Cancelled Treatment:    Reason Eval/Treat Not Completed: Medical issues which prohibited therapy RN requesting to hold at this time as blood flow to toes appears worse than yesterday. Will return as time allows and pt is appropriate.   [facility] OTR/L Acute Rehabilitation Services Office: [phone]   Providence Crosby 08/11/2023, 3:25 PM

## 2023-08-11 NOTE — Progress Notes (Signed)
PHARMACY - ANTICOAGULATION CONSULT NOTE  Pharmacy Consult for Heparin Indication:  acute LLE DVT  No Known Allergies  Patient Measurements: Height: 5\' 5"  (165.1 cm) Weight: 126 kg (277 lb 12.5 oz) IBW/kg (Calculated) : 61.5 Heparin Dosing Weight: 90 kg  Vital Signs: Temp: 98.7 F (37.1 C) (11/02 1940) Temp Source: Oral (11/02 1940) BP: 147/87 (11/02 1940) Pulse Rate: 96 (11/02 1940)  Labs: Recent Labs    08/09/23 0845 08/09/23 1039 08/09/23 2114 08/10/23 0558 08/11/23 0344 08/11/23 1221 08/11/23 2052  HGB 13.4  --   --  12.8* 12.5*  --   --   HCT 42.0  --   --  39.1 38.2*  --   --   PLT 306  --   --  360 359  --   --   APTT 34  --  38* 41* 43* 66* 71*  HEPARINUNFRC >1.10*  --  >1.10* 0.94* 0.42  --   --   CREATININE  --  1.37*  --  1.32*  --   --   --     Estimated Creatinine Clearance: 65.2 mL/min (A) (by C-G formula based on SCr of 1.32 mg/dL (H)).   Assessment: 67 YO male presenting 10/28 with a new LLE DVT nvolving the left femoral vein, popliteal vein as well as posterior tibial vein and peroneal vein.  There is also SVT involving the small saphenous vein as well as intramuscular thrombosis involving the left gastrocnemius vein and left soleus vein.  He also has ischemic changes on the left heel with ulceration as well as ischemic changes with ulceration between the fourth and fifth toes in the left foot--suggestive of underlying PAD. Pharmacy was originally consulted to dose Eliquis (last dose 10/30 @1005 ) but was transitioned to IV Heparin on 08/08/23 in anticipation of lower extremity arteriogram (tentative plan for this Friday 08/10/23 at [facility]).  aPTT therapeutic; no bleeding reported.  Goal of Therapy:  Heparin level 0.3-0.7 units/ml aPTT 66-102 seconds Monitor platelets by anticoagulation protocol: Yes   Plan:  Continue heparin drip at 2650 units/hr Daily aPTT, heparin level, and CBC  Nicolena Schurman D. Laney Potash, PharmD, BCPS, BCCCP 08/11/2023, 9:28 PM

## 2023-08-11 NOTE — Progress Notes (Signed)
PHARMACY - ANTICOAGULATION Pharmacy Consult for Heparin Indication:  acute LLE DVT Brief A/P: aPTT subtherapeutic Increase Heparin rate  No Known Allergies  Patient Measurements: Height: 5\' 5"  (165.1 cm) Weight: 126 kg (277 lb 12.5 oz) IBW/kg (Calculated) : 61.5 Heparin Dosing Weight: 90 kg  Vital Signs: Temp: 98.1 F (36.7 C) (11/01 2340) Temp Source: Oral (11/01 2340) BP: 136/77 (11/02 0408) Pulse Rate: 92 (11/02 0408)  Labs: Recent Labs    08/09/23 0845 08/09/23 1039 08/09/23 2114 08/10/23 0558 08/11/23 0344  HGB 13.4  --   --  12.8* 12.5*  HCT 42.0  --   --  39.1 38.2*  PLT 306  --   --  360 359  APTT 34  --  38* 41* 43*  HEPARINUNFRC >1.10*  --  >1.10* 0.94* 0.42  CREATININE  --  1.37*  --  1.32*  --     Estimated Creatinine Clearance: 65.2 mL/min (A) (by C-G formula based on SCr of 1.32 mg/dL (H)).   Assessment: 67 y.o. male with DVT, Eliquis on hold while awaiting peripheral bypass Monday, for heparin  Goal of Therapy:  Heparin level 0.3-0.7 units/ml aPTT 66-102 seconds Monitor platelets by anticoagulation protocol: Yes    Plan:  Increase Heparin 2650 units/hr Check aPTT in 8 hours   Geannie Risen, PharmD, BCPS  11/2/20244:39 AM

## 2023-08-11 NOTE — Progress Notes (Signed)
PHARMACY - ANTICOAGULATION CONSULT NOTE  Pharmacy Consult for Heparin Indication:  acute LLE DVT  No Known Allergies  Patient Measurements: Height: 5\' 5"  (165.1 cm) Weight: 126 kg (277 lb 12.5 oz) IBW/kg (Calculated) : 61.5 Heparin Dosing Weight: 90 kg  Vital Signs: Temp: 98.7 F (37.1 C) (11/02 1242) Temp Source: Oral (11/02 1242) BP: 140/78 (11/02 1242) Pulse Rate: 89 (11/02 0743)  Labs: Recent Labs    08/09/23 0845 08/09/23 1039 08/09/23 2114 08/10/23 0558 08/11/23 0344 08/11/23 1221  HGB 13.4  --   --  12.8* 12.5*  --   HCT 42.0  --   --  39.1 38.2*  --   PLT 306  --   --  360 359  --   APTT 34  --  38* 41* 43* 66*  HEPARINUNFRC >1.10*  --  >1.10* 0.94* 0.42  --   CREATININE  --  1.37*  --  1.32*  --   --     Estimated Creatinine Clearance: 65.2 mL/min (A) (by C-G formula based on SCr of 1.32 mg/dL (H)).   Assessment: 67 YO male presenting 10/28 with a new LLE DVT nvolving the left femoral vein, popliteal vein as well as posterior tibial vein and peroneal vein.  There is also SVT involving the small saphenous vein as well as intramuscular thrombosis involving the left gastrocnemius vein and left soleus vein.  He also has ischemic changes on the left heel with ulceration as well as ischemic changes with ulceration between the fourth and fifth toes in the left foot--suggestive of underlying PAD. Pharmacy was originally consulted to dose Eliquis (last dose 10/30 @1005 ) but was transitioned to IV Heparin on 08/08/23 in anticipation of lower extremity arteriogram (tentative plan for this Friday 08/10/23 at [facility]).    aPTT at lower end of therapeutic at 66 on 2650 units/hr. Heparin level this morning at 0.42 remains falsely elevated in comparison to aPTT level. Last dose of Eliquis was 10/30 AM. Will continue to use aPTT monitoring until aPTT and heparin levels correlate. Hgb stable at 12.5 and PLT WNL. No issues with infusion and no new bleeding noted per RN. Plan for  left fem-tib artery bypass on 11/4 Monday.  Goal of Therapy:  Heparin level 0.3-0.7 units/ml aPTT 66-102 seconds Monitor platelets by anticoagulation protocol: Yes    Plan:  Continue heparin drip at 2650 units/hr Obtain confirmatory aPTT in 8 hours Daily aPTT, heparin level, and CBC   Thank you for allowing pharmacy to be part of this patients care team.  Lennie Muckle, PharmD PGY1 Pharmacy Resident 08/11/2023 1:13 PM

## 2023-08-11 NOTE — Progress Notes (Signed)
PROGRESS NOTE    Tanner Phillips  ZOX:[id number] DOB: Mar 05, 1956 DOA: 08/06/2023 PCP: Ralene Ok, MD   Brief Narrative: 67 year old with past medical history significant for hypertension, lumbar disc disease, CAD status post MI, morbid obesity presents complaining of left leg pain ongoing for the last 10 days.  He also noted to have a lesion on the left heel as well as bilateral great toe.  He has also ulcers  in the intertriginous area bilaterally between fourth and fifth toes.  This has been progressively getting worse.  Patient presented with worsening left lower extremity pain redness and edema.  Patient was found to have left lower extremity DVT he was started on anticoagulation, he has been getting antibiotics for cellulitis as well.  ABIs were obtained on 10/30: Consistent with severe right and left lower extremity arterial disease. Vascular surgery consulted, he was transfer to [facility].  Plan is for angiography on Friday.      Assessment & Plan:   Principal Problem:   DVT, lower extremity (HCC) Active Problems:   Cellulitis   Radiculopathy   Essential hypertension   Renal insufficiency   PAD (peripheral artery disease) (HCC)  1-Acute left lower extremity DVT: Lower extremity Doppler show acute DVT involving the left femoral vein, popliteal vein as well as posterior tibial vein and peroneal vein.  There is also SVT involving the small saphenous vein as well as intramuscular thrombosis involving the left gastrocnemius vein and left soleus vein. -He has been more sedentary over last 10 day.  - factor V leyden, Lupus anticoagulant, Antithrombin. Rest hypercoagulable panel out patient. Advised to follow up with PCP appropriate screening for age for malignancy.  -Continue with Heparin Gtt  2-Critical limb ischemia Severe PAD bilaterally ABIs are 0.2 on the right and 0.9 on the left He has lesions that appears to be ischemic lesions primarily on the left leg. Vascular surgery  consulted. Eliquis changed to heparin for angiography  Lipid panel pending. and A1c 5.8 pre diabetic. .  S/P arteriogram, he will need left common femoral to distal anterior tibial artery bypass on Monday On IV heparin.  Worsening discoloration 4th and 5th toes left LE, will contact Vascular.   Left leg cellulitis Bilateral fourth and fifth toe intertriginous ulceration suspect related to vascular disease continue cefadroxil to complete 7-day Wound care consulted.   AKI versus CKD 3 A Five year agio Cr 1.5 GFR 45. Monitor renal function.   Chronic back pain with a spondylolisthesis Follow up with Dr Lovell Sheehan out patient.  He might have to delay steroid injection.   Morbid obesity: need life style modification.   Hypertension Continue with Norvasc.   Hyperlipoidemia  lipid panel. LDL 106   Estimated body mass index is 46.22 kg/m as calculated from the following:   Height as of this encounter: 5\' 5"  (1.651 m).   Weight as of this encounter: 126 kg.   DVT prophylaxis: heparin gtt Code Status: Full code Family Communication: family at bedside.  Disposition Plan:  Status is: Inpatient Remains inpatient appropriate because: management of DVT and critical limb ischemia.     Consultants:  Vascular.   Procedures:  Doppler;  ABI  Antimicrobials:    Subjective: He is alert report pain in leg, notice worsening discoloration of 4, 5 toes. Vascular has been informed/    Objective: Vitals:   08/10/23 2340 08/11/23 0408 08/11/23 0743 08/11/23 1011  BP: 123/66 136/77 (!) 140/82 109/69  Pulse: 97 92 89   Resp: 17 20  17   Temp: 98.1 F (36.7 C)  98 F (36.7 C)   TempSrc: Oral  Oral   SpO2: 93% 93% 93%   Weight:      Height:       No intake or output data in the 24 hours ending 08/11/23 1242  Filed Weights   08/06/23 1610 08/09/23 0325 08/10/23 1543  Weight: 120.7 kg 123 kg 126 kg    Examination:  General exam: NAD Respiratory system: CTA Cardiovascular  system: S 1, S 2   RRR Gastrointestinal system: BS present, soft, nt Central nervous system: alert Extremities: left LE with dark discoloration heel, and discoloration of 4th and 5th toes.     Data Reviewed: I have personally reviewed following labs and imaging studies  CBC: Recent Labs  Lab 08/06/23 0927 08/07/23 0445 08/08/23 0430 08/09/23 0845 08/10/23 0558 08/11/23 0344  WBC 12.8* 12.0* 9.5 9.2 10.0 11.0*  NEUTROABS 10.0*  --   --   --   --   --   HGB 14.2 14.1 13.7 13.4 12.8* 12.5*  HCT 43.6 44.1 42.4 42.0 39.1 38.2*  MCV 87.9 88.6 87.4 85.5 86.3 85.3  PLT 240 257 298 306 360 359   Basic Metabolic Panel: Recent Labs  Lab 08/06/23 0927 08/07/23 0445 08/08/23 0430 08/09/23 1039 08/10/23 0558  NA 136 136 136 139 139  K 4.0 3.8 3.9 3.7 3.8  CL 102 103 103 107 107  CO2 24 22 24  20* 23  GLUCOSE 103* 96 100* 91 102*  BUN 17 17 18 13 12   CREATININE 1.39* 1.42* 1.40* 1.37* 1.32*  CALCIUM 8.4* 8.4* 8.3* 8.1* 7.8*   GFR: Estimated Creatinine Clearance: 65.2 mL/min (A) (by C-G formula based on SCr of 1.32 mg/dL (H)). Liver Function Tests: No results for input(s): "AST", "ALT", "ALKPHOS", "BILITOT", "PROT", "ALBUMIN" in the last 168 hours. No results for input(s): "LIPASE", "AMYLASE" in the last 168 hours. No results for input(s): "AMMONIA" in the last 168 hours. Coagulation Profile: No results for input(s): "INR", "PROTIME" in the last 168 hours. Cardiac Enzymes: No results for input(s): "CKTOTAL", "CKMB", "CKMBINDEX", "TROPONINI" in the last 168 hours. BNP (last 3 results) No results for input(s): "PROBNP" in the last 8760 hours. HbA1C: Recent Labs    08/10/23 0558  HGBA1C 5.8*   CBG: No results for input(s): "GLUCAP" in the last 168 hours. Lipid Profile: Recent Labs    08/10/23 0558 08/11/23 0344  CHOL 146 150  HDL 27* 27*  LDLCALC 99 106*  TRIG 102 85  CHOLHDL 5.4 5.6   Thyroid Function Tests: No results for input(s): "TSH", "T4TOTAL", "FREET4",  "T3FREE", "THYROIDAB" in the last 72 hours. Anemia Panel: No results for input(s): "VITAMINB12", "FOLATE", "FERRITIN", "TIBC", "IRON", "RETICCTPCT" in the last 72 hours. Sepsis Labs: Recent Labs  Lab 08/06/23 1532 08/06/23 1743  LATICACIDVEN 1.0 1.0    No results found for this or any previous visit (from the past 240 hour(s)).       Radiology Studies: VAS [place] LOWER EXTREMITY SAPHENOUS VEIN MAPPING  Result Date: 08/11/2023 LOWER EXTREMITY VEIN MAPPING Patient Name:  LODEN LAURENT [facility]  Date of Exam:   08/10/2023 Medical Rec #: [id number]     Accession #:    [id number] Date of Birth: [birth year]      Patient Gender: M Patient Age:   63 years Exam Location:  [facility] Procedure:      VAS [place] LOWER EXTREMITY SAPHENOUS VEIN MAPPING Referring Phys: Heath Lark --------------------------------------------------------------------------------  History:  History of PAD; patient is pre-operative for lower extremity                     bypass graft. Risk Factors:       Hypertension, hyperlipidemia, coronary artery disease. Other Risk Factors: Obesity.  Performing Technologist: Marilynne Halsted RDMS, RVT  Examination Guidelines: A complete evaluation includes B-mode imaging, spectral Doppler, color Doppler, and power Doppler as needed of all accessible portions of each vessel. Bilateral testing is considered an integral part of a complete examination. Limited examinations for reoccurring indications may be performed as noted. +---------------+-----------+----------------------+---------------+-----------+   RT Diameter  RT Findings         GSV            LT Diameter  LT Findings      (cm)                                            (cm)                  +---------------+-----------+----------------------+---------------+-----------+      0.65                     Saphenofemoral         0.84                                                   Junction                                   +---------------+-----------+----------------------+---------------+-----------+      0.43                     Proximal thigh         0.53                  +---------------+-----------+----------------------+---------------+-----------+      0.35                       Mid thigh            0.56       branching  +---------------+-----------+----------------------+---------------+-----------+      0.49       branching      Distal thigh          0.55                  +---------------+-----------+----------------------+---------------+-----------+      0.37                          Knee              0.50                  +---------------+-----------+----------------------+---------------+-----------+      0.46                       Prox calf            0.46                  +---------------+-----------+----------------------+---------------+-----------+  0.38                        Mid calf            0.44                  +---------------+-----------+----------------------+---------------+-----------+      0.37                      Distal calf           0.37                  +---------------+-----------+----------------------+---------------+-----------+      0.40                         Ankle              0.39                  +---------------+-----------+----------------------+---------------+-----------+ Diagnosing physician: Coral Else MD Electronically signed by Coral Else MD on 08/11/2023 at 10:51:47 AM.    Final    PERIPHERAL VASCULAR CATHETERIZATION  Result Date: 08/10/2023 DATE OF SERVICE: 08/10/2023  PATIENT:  [name]  67 y.o. male  PRE-OPERATIVE DIAGNOSIS:  Atherosclerosis of native arteries of left lower extremity causing ulceration  POST-OPERATIVE DIAGNOSIS:  Same  PROCEDURE:  1) Ultrasound guided right common femoral artery access 2) Aortogram 3) Left lower extremity angiogram with second order cannulation (95mL total contrast) 4) Conscious  sedation (20 minutes)  SURGEON:  Rande Brunt. Lenell Antu, MD  ASSISTANT: none  ANESTHESIA:   local and IV sedation  ESTIMATED BLOOD LOSS: minimal  LOCAL MEDICATIONS USED:  LIDOCAINE  COUNTS: confirmed correct.  PATIENT DISPOSITION:  PACU - hemodynamically stable.  Delay start of Pharmacological VTE agent (>24hrs) due to surgical blood loss or risk of bleeding: no  INDICATION FOR PROCEDURE: MOXON MESSLER is a 67 y.o. male with left foot ulceration and non-invasive evidence of peripheral arterial disease. After careful discussion of risks, benefits, and alternatives the patient was offered angiography. The patient understood and wished to proceed.  OPERATIVE FINDINGS: Renal arteries patent bilaterally. Infrarenal aorta patent Iliac arteries patent bilaterally.  Highly tortuous  Right lower extremity: Common femoral artery: Patent Profunda femoris artery: Patent Superficial femoral artery: Occluded Popliteal artery: Occluded Anterior tibial artery:  reconstitutes just distal to origin, occludes in mid calf Tibioperoneal trunk: occludes Peroneal artery: reconstitutes just distal to origin, flows to ankle where it arborizes Posterior tibial artery: reconstitutes just distal to origin, occludes in mid calf  Left lower extremity: Common femoral artery: Patent Profunda femoris artery: Patent Superficial femoral artery: Occluded Popliteal artery: Occluded Anterior tibial artery: Reconstitutes at the ankle, and fills the dorsalis pedis, which fills the pedal circulation. Tibioperoneal trunk: Occluded Peroneal artery: Occluded Posterior tibial artery: Reconstitutes in the calf, close to the ankle where it occludes Pedal circulation: Fills via DP  DESCRIPTION OF PROCEDURE: After identification of the patient in the pre-operative holding area, the patient was transferred to the operating room. The patient was positioned supine on the operating room table.  Anesthesia was induced. The groins was prepped and draped in standard fashion. A  surgical pause was performed confirming correct patient, procedure, and operative location.  The right groin was anesthetized with subcutaneous injection of 1% lidocaine. Using ultrasound guidance, the right common femoral artery was accessed with micropuncture technique. Fluoroscopy was used to confirm cannulation  over the femoral head. The 54F sheath was upsized to 33F.  A Benson wire was advanced into the distal aorta. Over the wire an omni flush catheter was advanced to the level of L2. Aortogram was performed - see above for details. Bilateral lower extremity runoff angiography was performed - see above for details.  The left common iliac artery was selected with an omniflush catheter and benson guidewire. The wire was advanced into the common femoral artery. Over the wire the omni flush catheter was advanced into the external iliac artery. Selective angiography was performed - see above for details.  The sheath was left in place to be removed in the recovery area.  Conscious sedation was administered with the use of IV fentanyl and midazolam under continuous physician and nurse monitoring.  Heart rate, blood pressure, and oxygen saturation were continuously monitored.  Total sedation time was 20 minutes  Upon completion of the case instrument and sharps counts were confirmed correct. The patient was transferred to the PACU in good condition. I was present for all portions of the procedure.  PLAN: Left common femoral to distal anterior tibial artery bypass on Monday. Co-case with Dr. Chestine Spore. Vein mapping today.  Rande Brunt. Lenell Antu, MD [facility] Phone Number: [phone] 08/10/2023 10:48 AM   CT HEAD WO CONTRAST ( )  Result Date: 08/09/2023 CLINICAL DATA:  Initial evaluation for acute head trauma, minor. Rate EXAM: CT HEAD WITHOUT CONTRAST TECHNIQUE: Contiguous axial images were obtained from the base of the skull through the vertex without intravenous contrast.  RADIATION DOSE REDUCTION: This exam was performed according to the departmental dose-optimization program which includes automated exposure control, adjustment of the mA and/or kV according to patient size and/or use of iterative reconstruction technique. COMPARISON:  None Available. FINDINGS: Brain: Examination mildly degraded by motion artifact. Cerebral volume within normal limits. No acute intracranial hemorrhage. No acute large vessel territory infarct. No mass lesion, mass effect or midline shift. No hydrocephalus or extra-axial fluid collection. Vascular: No abnormal hyperdense vessel. Skull: No acute scalp soft tissue abnormality.  Calvarium intact. Sinuses/Orbits: Globes and orbital soft tissues within normal limits. Paranasal sinuses and mastoid air cells are largely clear. Other: None. IMPRESSION: Normal head CT.  No acute intracranial abnormality. Electronically Signed   By: Rise Mu M.D.   On: 08/09/2023 19:20        Scheduled Meds:  amLODipine  10 mg Oral Daily   aspirin EC  81 mg Oral Daily   atorvastatin  40 mg Oral Daily   gabapentin  300 mg Oral BID   sodium chloride flush  3 mL Intravenous Q12H   zolpidem  5 mg Oral QHS   Continuous Infusions:  sodium chloride     heparin 2,650 Units/hr (08/11/23 0627)     LOS: 3 days    Time spent: 35 minutes    Herberto Ledwell A Briell Paulette, MD [facility]   If 7PM-7AM, please contact night-coverage [email or web address]  08/11/2023, 12:42 PM

## 2023-08-12 DIAGNOSIS — M79605 Pain in left leg: Secondary | ICD-10-CM | POA: Diagnosis not present

## 2023-08-12 LAB — COMPREHENSIVE METABOLIC PANEL
ALT: 21 U/L (ref 0–44)
AST: 20 U/L (ref 15–41)
Albumin: 2.2 g/dL — ABNORMAL LOW (ref 3.5–5.0)
Alkaline Phosphatase: 43 U/L (ref 38–126)
Anion gap: 8 (ref 5–15)
BUN: 12 mg/dL (ref 8–23)
CO2: 23 mmol/L (ref 22–32)
Calcium: 8.3 mg/dL — ABNORMAL LOW (ref 8.9–10.3)
Chloride: 107 mmol/L (ref 98–111)
Creatinine, Ser: 1.33 mg/dL — ABNORMAL HIGH (ref 0.61–1.24)
GFR, Estimated: 58 mL/min — ABNORMAL LOW (ref >60)
Glucose, Bld: 91 mg/dL (ref 70–99)
Potassium: 3.7 mmol/L (ref 3.5–5.1)
Sodium: 138 mmol/L (ref 135–145)
Total Bilirubin: 0.8 mg/dL (ref 0.3–1.2)
Total Protein: 5.3 g/dL — ABNORMAL LOW (ref 6.5–8.1)

## 2023-08-12 LAB — CBC
HCT: 39.4 % (ref 39.0–52.0)
Hemoglobin: 12.9 g/dL — ABNORMAL LOW (ref 13.0–17.0)
MCH: 28 pg (ref 26.0–34.0)
MCHC: 32.7 g/dL (ref 30.0–36.0)
MCV: 85.7 fL (ref 80.0–100.0)
Platelets: 399 10*3/uL (ref 150–400)
RBC: 4.6 MIL/uL (ref 4.22–5.81)
RDW: 13.2 % (ref 11.5–15.5)
WBC: 11.2 10*3/uL — ABNORMAL HIGH (ref 4.0–10.5)
nRBC: 0 % (ref 0.0–0.2)

## 2023-08-12 LAB — BETA-2-GLYCOPROTEIN I ABS, IGG/M/A
Beta-2 Glyco I IgG: <9 GPI IgG units (ref 0–20)
Beta-2-Glycoprotein I IgA: <9 GPI IgA units (ref 0–25)
Beta-2-Glycoprotein I IgM: <9 GPI IgM units (ref 0–32)

## 2023-08-12 LAB — APTT: aPTT: 64 s — ABNORMAL HIGH (ref 24–36)

## 2023-08-12 LAB — HEPARIN LEVEL (UNFRACTIONATED): Heparin Unfractionated: 0.44 [IU]/mL (ref 0.30–0.70)

## 2023-08-12 MED ORDER — CEFAZOLIN SODIUM-DEXTROSE 2-4 GM/100ML-% IV SOLN
2.0000 g | INTRAVENOUS | Status: AC
  Filled 2023-08-12: qty 100

## 2023-08-12 MED ORDER — SODIUM CHLORIDE 0.9 % IV SOLN
2.0000 g | INTRAVENOUS | Status: DC
  Administered 2023-08-12 – 2023-08-16 (×5): 2 g via INTRAVENOUS
  Filled 2023-08-12 (×5): qty 20

## 2023-08-12 MED ORDER — METRONIDAZOLE 500 MG/100ML IV SOLN
500.0000 mg | Freq: Two times a day (BID) | INTRAVENOUS | Status: DC
  Administered 2023-08-12 – 2023-08-17 (×9): 500 mg via INTRAVENOUS
  Filled 2023-08-12 (×11): qty 100

## 2023-08-12 NOTE — Progress Notes (Signed)
PHARMACY - ANTICOAGULATION CONSULT NOTE  Pharmacy Consult for Heparin Indication:  acute LLE DVT  No Known Allergies  Patient Measurements: Height: 5\' 5"  (165.1 cm) Weight: 126 kg (277 lb 12.5 oz) IBW/kg (Calculated) : 61.5 Heparin Dosing Weight: 90 kg  Vital Signs: Temp: 98.7 F (37.1 C) (11/03 0407) Temp Source: Oral (11/03 0407) BP: 126/83 (11/03 0407) Pulse Rate: 94 (11/03 0407)  Labs: Recent Labs    08/09/23 1039 08/09/23 2114 08/10/23 0558 08/11/23 0344 08/11/23 1221 08/11/23 2052 08/12/23 0359  HGB  --    < > 12.8* 12.5*  --   --  12.9*  HCT  --   --  39.1 38.2*  --   --  39.4  PLT  --   --  360 359  --   --  399  APTT  --    < > 41* 43* 66* 71* 64*  HEPARINUNFRC  --    < > 0.94* 0.42  --   --  0.44  CREATININE 1.37*  --  1.32*  --   --   --  1.33*   < > = values in this interval not displayed.    Estimated Creatinine Clearance: 64.7 mL/min (A) (by C-G formula based on SCr of 1.33 mg/dL (H)).   Assessment: 67 YO male presenting 10/28 with a new LLE DVT nvolving the left femoral vein, popliteal vein as well as posterior tibial vein and peroneal vein.  There is also SVT involving the small saphenous vein as well as intramuscular thrombosis involving the left gastrocnemius vein and left soleus vein.  He also has ischemic changes on the left heel with ulceration as well as ischemic changes with ulceration between the fourth and fifth toes in the left foot--suggestive of underlying PAD. Pharmacy was originally consulted to dose Eliquis (last dose 10/30 @1005 ) but was transitioned to IV Heparin on 08/08/23 in anticipation of lower extremity arteriogram (tentative plan for this Friday 08/10/23 at [facility]).  This morning, aPTT is slightly subtherapeutic at 64, but heparin level is therapeutic at 0.44 on 2650 units/hr. Heparin level and aPTT correlating at this time, so will continue to monitor heparin levels only moving forward. Hgb and plt counts stable. No issues with  infusion and no new bleeding noted per RN.   Goal of Therapy:  Heparin level 0.3-0.7 units/ml Monitor platelets by anticoagulation protocol: Yes   Plan:  Continue heparin drip at 2650 units/hr Daily heparin level and CBC Follow-up transition back to Eliquis following procedure  Lennie Muckle, PharmD PGY1 Pharmacy Resident 08/12/2023 8:15 AM

## 2023-08-12 NOTE — Progress Notes (Signed)
Occupational Therapy Treatment Patient Details Name: Tanner Phillips MRN: [id number] DOB: [birth year] Today's Date: 08/12/2023   History of present illness Pt is a 68 yo male admitted with worsening LE cellulitis and found to have LLE DVT. S/p LLE angiogram 11/01. Plan for left fem-distal bypass on Monday. PHM: ruptured disk in back, HTN, CAD s/p MI   OT comments  Pt is POD #2 s/p angiogram. Pt currently requires modA+2 for sit<>stand from EOB x3 during session. He requires modA for LB dressing. Pt tolerated sitting EOB ~20min this session. He will continue to benefit from skill OT services during admission. Will re-evaluate following bypass. Updated d/c plan this date. Will continue to follow acutely.       If plan is discharge home, recommend the following:  A little help with bathing/dressing/bathroom;Assistance with cooking/housework;Assist for transportation;Help with stairs or ramp for entrance;A lot of help with walking and/or transfers   Equipment Recommendations  BSC/3in1 (already received BSC)    Recommendations for Other Services      Precautions / Restrictions Precautions Precautions: Fall Precaution Comments: reports severe pain in the left foot to touch, unable to bear weight Restrictions Weight Bearing Restrictions: No LLE Weight Bearing: Weight bearing as tolerated Other Position/Activity Restrictions: Pt allowed to weight bear through LLE but unable at this time.       Mobility Bed Mobility Overal bed mobility: Modified Independent             General bed mobility comments: increased time effort and use of momentum no assistance from therapist provided    Transfers Overall transfer level: Needs assistance Equipment used: Rolling walker (2 wheels) Transfers: Sit to/from Stand, Bed to chair/wheelchair/BSC Sit to Stand: Mod assist, +2 physical assistance           General transfer comment: use of momentum and pulling up with therapist providing modA+2 to  assist pt with powerup into standing. sit<>stand from EOB x3 during session.     Balance Overall balance assessment: Needs assistance Sitting-balance support: Feet supported Sitting balance-Leahy Scale: Normal     Standing balance support: Bilateral upper extremity supported, During functional activity Standing balance-Leahy Scale: Poor Standing balance comment: Pt must have outside support to remain standing. Cannot put weight through LLE.                           ADL either performed or assessed with clinical judgement   ADL Overall ADL's : Needs assistance/impaired     Grooming: Set up;Sitting               Lower Body Dressing: Moderate assistance;Sit to/from stand;Cueing for compensatory techniques       Toileting- Clothing Manipulation and Hygiene: Moderate assistance;+2 for safety/equipment Toileting - Clothing Manipulation Details (indicate cue type and reason): for sit<>stand from EOB     Functional mobility during ADLs: Moderate assistance;+2 for physical assistance;+2 for safety/equipment;Rolling walker (2 wheels) General ADL Comments: Pt lmited due to pain in LLE. Pt unable to bear weight through LLE or take any steps even when NWB.    Extremity/Trunk Assessment Upper Extremity Assessment Upper Extremity Assessment: Overall WFL for tasks assessed   Lower Extremity Assessment Lower Extremity Assessment: Defer to PT evaluation LLE Deficits / Details: lacks full ROM ankle dorsiflexion active,  limited WB, noted ischemic areas on lateral foot, dorsal and plantar and 4th webspace   Cervical / Trunk Assessment Cervical / Trunk Assessment: Other exceptions Cervical / Trunk Exceptions: guarded  to  flex trunk    Vision Baseline Vision/History: 0 No visual deficits Ability to See in Adequate Light: 0 Adequate Patient Visual Report: No change from baseline Vision Assessment?: No apparent visual deficits   Perception Perception Perception: Within  Functional Limits   Praxis Praxis Praxis: WFL    Cognition Arousal: Alert Behavior During Therapy: WFL for tasks assessed/performed Overall Cognitive Status: Within Functional Limits for tasks assessed                                          Exercises      Shoulder Instructions       General Comments Pt on RA throughout session SpO2 87%-95% with exertion, cues for pursed lip breathing. HR up to 122bpm with exertion. BP 150/22mmHg initial sitting    Pertinent Vitals/ Pain       Pain Assessment Pain Assessment: 0-10 Pain Score: 10-Worst pain ever Pain Location: LL E> RLE with dependent position or any touch to floor Pain Descriptors / Indicators: Sharp, Shooting Pain Intervention(s): Monitored during session, Limited activity within patient's tolerance  Home Living Family/patient expects to be discharged to:: Private residence Living Arrangements: Spouse/significant other Available Help at Discharge: Family;Available 24 hours/day Type of Home: House Home Access: Stairs to enter [employer] of Steps: 2   Home Layout: One level     Bathroom Shower/Tub: Walk-in shower;Door   [employer] Toilet: Standard     Home Equipment: Agricultural consultant (2 wheels);Wheelchair - Sport and exercise psychologist Comments: Pt needs 3:1      Prior Functioning/Environment              Frequency  Min 1X/week        Progress Toward Goals  OT Goals(current goals can now be found in the care plan section)  Progress towards OT goals: Progressing toward goals  Acute Rehab OT Goals Patient Stated Goal: to not be in pain OT Goal Formulation: With patient/family Time For Goal Achievement: 08/22/23 Potential to Achieve Goals: Good ADL Goals Pt Will Perform Grooming: with modified independence;standing Pt Will Perform Lower Body Bathing: with modified independence;sit to/from stand Pt Will Perform Lower Body Dressing: with modified independence;sit  to/from stand Pt Will Perform Tub/Shower Transfer: Shower transfer;with supervision;ambulating;shower seat;rolling walker Additional ADL Goal #1: Pt will walk to bathroom with walker and complete all toileting with mod I  Plan      Co-evaluation    PT/OT/SLP Co-Evaluation/Treatment: Yes Reason for Co-Treatment: Complexity of the patient's impairments (multi-system involvement);For patient/therapist safety;To address functional/ADL transfers   OT goals addressed during session: ADL's and self-care      AM-PAC OT "6 Clicks" Daily Activity     Outcome Measure   Help from another person eating meals?: None Help from another person taking care of personal grooming?: A Little Help from another person toileting, which includes using toliet, bedpan, or urinal?: A Lot Help from another person bathing (including washing, rinsing, drying)?: A Little Help from another person to put on and taking off regular upper body clothing?: A Little Help from another person to put on and taking off regular lower body clothing?: A Lot 6 Click Score: 17    End of Session Equipment Utilized During Treatment: Rolling walker (2 wheels)  OT Visit Diagnosis: Unsteadiness on feet (R26.81);Other abnormalities of gait and mobility (R26.89);Pain Pain - Right/Left: Left Pain - part of body: Leg   Activity  Tolerance Patient limited by pain;Patient tolerated treatment well   Patient Left in bed;with call bell/phone within reach;with family/visitor present   Nurse Communication Weight bearing status;Mobility status        Time: 5784-6962 OT Time Calculation (min): 28 min  Charges: OT General Charges $OT Visit: 1 Visit OT Treatments $Self Care/Home Management : 8-22 mins  Rosey Bath OTR/L Acute Rehabilitation Services Office: [phone]   Providence Crosby 08/12/2023, 11:23 AM

## 2023-08-12 NOTE — Progress Notes (Signed)
Physical Therapy Treatment Patient Details Name: Tanner Phillips MRN: [id number] DOB: [birth year] Today's Date: 08/12/2023   History of Present Illness Pt is a 67 yo male admitted with worsening LE cellulitis and found to have LLE DVT. S/p LLE angiogram 11/01. Plan for left fem-distal bypass on Monday. PHM: ruptured disk in back, HTN, CAD s/p MI    PT Comments  Pt in bed upon arrival and agreeable to PT session. Worked on transfers and balance in standing in today's session. Pt was able to stand x3 with ModAx2 and RW. Pt was able to tolerate standing for <40 secs each stand. Practiced holding L LE in front on self to off load L LE. Pt with heavy UE usage on RW for standing at this time. Pt is progressing slowly towards goals. Will continue to assess following L fem-distal bypass on 11/4. Acute PT to follow.      If plan is discharge home, recommend the following: Two people to help with walking and/or transfers;Assistance with cooking/housework;Assist for transportation;Help with stairs or ramp for entrance   Can travel by private vehicle      No  Equipment Recommendations  None recommended by PT       Precautions / Restrictions Precautions Precautions: Fall Precaution Comments: reports severe pain in the left foot to touch, limited ability to WB on L LE Restrictions Weight Bearing Restrictions: No LLE Weight Bearing: Weight bearing as tolerated Other Position/Activity Restrictions: Pt allowed to weight bear through LLE but unable tolerate due to pain     Mobility  Bed Mobility Overal bed mobility: Modified Independent  General bed mobility comments: increased time and effort, pt moves quickly to use momentum    Transfers Overall transfer level: Needs assistance Equipment used: Rolling walker (2 wheels) Transfers: Sit to/from Stand, Bed to chair/wheelchair/BSC Sit to Stand: Mod assist, +2 physical assistance  General transfer comment: ModAx2 for power up and initial rise. Pt rocks  to [employer]. Pt prefers to pull on RW to stand despite cues. Practiced keeping L LE off the ground while in standing           Balance Overall balance assessment: Needs assistance Sitting-balance support: Feet supported Sitting balance-Leahy Scale: Normal     Standing balance support: Bilateral upper extremity supported, During functional activity Standing balance-Leahy Scale: Poor Standing balance comment: reliant on RW, difficulty tolerating WB on L LE       Cognition Arousal: Alert Behavior During Therapy: WFL for tasks assessed/performed Overall Cognitive Status: Within Functional Limits for tasks assessed       Exercises General Exercises - Lower Extremity Ankle Circles/Pumps: AROM, Both, 5 reps, Supine (limited L ankle ROM in supine, unable to move L ankle in sitting) Straight Leg Raises: AROM, Both, 5 reps, Supine    General Comments General comments (skin integrity, edema, etc.): 87-95% SpO2 on RA, cues for pursed lip breathing as pt became SOB with activity. HR increased to 122 BPM with stands. 150/99 BP sitting EOB      Pertinent Vitals/Pain Pain Assessment Pain Assessment: Faces Faces Pain Scale: Hurts worst Pain Location: LLE> RLE with dependent position and WB Pain Descriptors / Indicators: Sharp, Shooting Pain Intervention(s): Monitored during session, Limited activity within patient's tolerance, Repositioned    Home Living Family/patient expects to be discharged to:: Private residence Living Arrangements: Spouse/significant other Available Help at Discharge: Family;Available 24 hours/day Type of Home: House Home Access: Stairs to enter   [employer] of Steps: 2   Home Layout: One  level Home Equipment: Agricultural consultant (2 wheels);Wheelchair - manual;Shower seat Additional Comments: Pt needs 3:1    Prior Function            PT Goals (current goals can now be found in the care plan section) Acute Rehab PT Goals PT Goal  Formulation: With patient/family Time For Goal Achievement: 08/22/23 Potential to Achieve Goals: Good Progress towards PT goals: Progressing toward goals    Frequency    Min 1X/week           Co-evaluation PT/OT/SLP Co-Evaluation/Treatment: Yes Reason for Co-Treatment: Complexity of the patient's impairments (multi-system involvement);For patient/therapist safety;To address functional/ADL transfers PT goals addressed during session: Mobility/safety with mobility;Balance;Proper use of DME;Strengthening/ROM OT goals addressed during session: ADL's and self-care      AM-PAC PT "6 Clicks" Mobility   Outcome Measure  Help needed turning from your back to your side while in a flat bed without using bedrails?: None Help needed moving from lying on your back to sitting on the side of a flat bed without using bedrails?: None Help needed moving to and from a bed to a chair (including a wheelchair)?: Total Help needed standing up from a chair using your arms (e.g., wheelchair or bedside chair)?: Total Help needed to walk in hospital room?: Total Help needed climbing 3-5 steps with a railing? : Total 6 Click Score: 12    End of Session Equipment Utilized During Treatment: Gait belt Activity Tolerance: Patient limited by pain Patient left: in bed;with call bell/phone within reach;with family/visitor present Nurse Communication: Mobility status PT Visit Diagnosis: Unsteadiness on feet (R26.81);Muscle weakness (generalized) (M62.81)     Time: 1610-9604 PT Time Calculation (min) (ACUTE ONLY): 27 min  Charges:    $Therapeutic Activity: 8-22 mins PT General Charges $$ ACUTE PT VISIT: 1 Visit                     [name], PT, DPT Secure Chat Preferred  Rehab Office [phone]   [name] 08/12/2023, 1:08 PM

## 2023-08-12 NOTE — Anesthesia Preprocedure Evaluation (Signed)
Anesthesia Evaluation  Patient identified by MRN, date of birth, ID band Patient awake    Reviewed: Allergy & Precautions, NPO status , Patient's Chart, lab work & pertinent test results  Airway Mallampati: II  TM Distance: >3 FB Neck ROM: Full    Dental no notable dental hx.    Pulmonary neg pulmonary ROS   Pulmonary exam normal        Cardiovascular hypertension, Pt. on medications + Past MI and + Peripheral Vascular Disease   Rhythm:Regular Rate:Normal     Neuro/Psych negative neurological ROS  negative psych ROS   GI/Hepatic negative GI ROS, Neg liver ROS,,,  Endo/Other  negative endocrine ROS    Renal/GU   negative genitourinary   Musculoskeletal negative musculoskeletal ROS (+)    Abdominal Normal abdominal exam  (+)   Peds  Hematology Lab Results      Component                Value               Date                      WBC                      11.2 (H)            08/12/2023                HGB                      12.9 (L)            08/12/2023                HCT                      39.4                08/12/2023                MCV                      85.7                08/12/2023                PLT                      399                 08/12/2023             Lab Results      Component                Value               Date                      NA                       138                 08/12/2023                K  3.7                 08/12/2023                CO2                      23                  08/12/2023                GLUCOSE                  91                  08/12/2023                BUN                      12                  08/12/2023                CREATININE               1.33 (H)            08/12/2023                CALCIUM                  8.3 (L)             08/12/2023                GFRNONAA                 58 (L)              08/12/2023               Anesthesia Other Findings   Reproductive/Obstetrics                             Anesthesia Physical Anesthesia Plan  ASA: 3  Anesthesia Plan: General   Post-op Pain Management: Tylenol PO (pre-op)*   Induction: Intravenous  PONV Risk Score and Plan: 2 and Ondansetron, Dexamethasone and Treatment may vary due to age or medical condition  Airway Management Planned: Mask and Oral ETT  Additional Equipment: Arterial line  Intra-op Plan:   Post-operative Plan: Extubation in OR  Informed Consent: I have reviewed the patients History and Physical, chart, labs and discussed the procedure including the risks, benefits and alternatives for the proposed anesthesia with the patient or authorized representative who has indicated his/her understanding and acceptance.     Dental advisory given  Plan Discussed with: CRNA  Anesthesia Plan Comments:        Anesthesia Quick Evaluation

## 2023-08-12 NOTE — Progress Notes (Signed)
PROGRESS NOTE    Tanner Phillips  WGN:[id number] DOB: [birth year] DOA: 08/06/2023 PCP: Ralene Ok, MD   Brief Narrative: 67 year old with past medical history significant for hypertension, lumbar disc disease, CAD status post MI, morbid obesity presents complaining of left leg pain ongoing for the last 10 days.  He also noted to have a lesion on the left heel as well as bilateral great toe.  He has also ulcers  in the intertriginous area bilaterally between fourth and fifth toes.  This has been progressively getting worse.  Patient presented with worsening left lower extremity pain redness and edema.  Patient was found to have left lower extremity DVT he was started on anticoagulation, he has been getting antibiotics for cellulitis as well.  ABIs were obtained on 10/30: Consistent with severe right and left lower extremity arterial disease. Vascular surgery consulted, he was transfer to [facility].  Plan is for angiography on Friday.      Assessment & Plan:   Principal Problem:   DVT, lower extremity (HCC) Active Problems:   Cellulitis   Radiculopathy   Essential hypertension   Renal insufficiency   PAD (peripheral artery disease) (HCC)  1-Acute left lower extremity DVT: -Lower extremity Doppler show acute DVT involving the left femoral vein, popliteal vein as well as posterior tibial vein and peroneal vein.  There is also SVT involving the small saphenous vein as well as intramuscular thrombosis involving the left gastrocnemius vein and left soleus vein. -He has been more sedentary over last 10 day.  - Factor V leyden, Lupus anticoagulant elevated, needs to be repeated off anticoagulation, Antithrombin 66. Rest hypercoagulable panel out patient. Advised to follow up with PCP appropriate screening for age for malignancy.  -Continue with Heparin Gtt  2-Critical limb ischemia Severe PAD bilaterally ABIs are 0.2 on the right and 0.9 on the left He has lesions that appears to be ischemic  lesions primarily on the left leg. Vascular surgery consulted. Eliquis changed to heparin for angiography  Lipid panel pending. and A1c 5.8 pre diabetic. .  S/P arteriogram, he will need left common femoral to distal anterior tibial artery bypass on Monday On IV heparin.  Worsening discoloration 4th and 5th toes left LE, vascular aware, bypass Monday.   Left leg cellulitis Bilateral fourth and fifth toe intertriginous ulceration suspect related to vascular disease Completed cefaxodril.  Having more redness and discoloration of toes. Will cover for infection,./ start Ceftriaxone and Flagyl.  Wound care consulted.   AKI versus CKD 3 A Five year agio Cr 1.5 GFR 45. Monitor renal function.  Stable.   Chronic back pain with a spondylolisthesis Follow up with Dr Lovell Sheehan out patient.  He might have to delay steroid injection.   Morbid obesity: need life style modification.   Hypertension Continue with Norvasc.   Hyperlipoidemia  lipid panel. LDL 106   Estimated body mass index is 46.22 kg/m as calculated from the following:   Height as of this encounter: 5\' 5"  (1.651 m).   Weight as of this encounter: 126 kg.   DVT prophylaxis: heparin gtt Code Status: Full code Family Communication: family at bedside.  Disposition Plan:  Status is: Inpatient Remains inpatient appropriate because: management of DVT and critical limb ischemia.     Consultants:  Vascular.   Procedures:  Doppler;  ABI  Antimicrobials:    Subjective: He had BM. Still having pain left leg.    Objective: Vitals:   08/11/23 1242 08/11/23 1647 08/11/23 1940 08/12/23 0407  BP: Marland Kitchen)  140/78 (!) 141/79 (!) 147/87 126/83  Pulse:  93 96 94  Resp: 18 18 20 17   Temp: 98.7 F (37.1 C) 98.7 F (37.1 C) 98.7 F (37.1 C) 98.7 F (37.1 C)  TempSrc: Oral Oral Oral Oral  SpO2:  91% 97% 93%  Weight:      Height:        Intake/Output Summary (Last 24 hours) at 08/12/2023 1145 Last data filed at 08/12/2023  0730 Gross per 24 hour  Intake 476.72 ml  Output 500 ml  Net -23.28 ml    Filed Weights   08/06/23 5638 08/09/23 0325 08/10/23 1543  Weight: 120.7 kg 123 kg 126 kg    Examination:  General exam: NAD Respiratory system: CTA Cardiovascular system: S 1, S 2  RRR Gastrointestinal system: BS present, soft, nt Central nervous system: Alert, follows command Extremities: left LE with dark discoloration heel, and discoloration of 4th and 5th toes.     Data Reviewed: I have personally reviewed following labs and imaging studies  CBC: Recent Labs  Lab 08/06/23 0927 08/07/23 0445 08/08/23 0430 08/09/23 0845 08/10/23 0558 08/11/23 0344 08/12/23 0359  WBC 12.8*   < > 9.5 9.2 10.0 11.0* 11.2*  NEUTROABS 10.0*  --   --   --   --   --   --   HGB 14.2   < > 13.7 13.4 12.8* 12.5* 12.9*  HCT 43.6   < > 42.4 42.0 39.1 38.2* 39.4  MCV 87.9   < > 87.4 85.5 86.3 85.3 85.7  PLT 240   < > 298 306 360 359 399   < > = values in this interval not displayed.   Basic Metabolic Panel: Recent Labs  Lab 08/07/23 0445 08/08/23 0430 08/09/23 1039 08/10/23 0558 08/12/23 0359  NA 136 136 139 139 138  K 3.8 3.9 3.7 3.8 3.7  CL 103 103 107 107 107  CO2 22 24 20* 23 23  GLUCOSE 96 100* 91 102* 91  BUN 17 18 13 12 12   CREATININE 1.42* 1.40* 1.37* 1.32* 1.33*  CALCIUM 8.4* 8.3* 8.1* 7.8* 8.3*   GFR: Estimated Creatinine Clearance: 64.7 mL/min (A) (by C-G formula based on SCr of 1.33 mg/dL (H)). Liver Function Tests: Recent Labs  Lab 08/12/23 0359  AST 20  ALT 21  ALKPHOS 43  BILITOT 0.8  PROT 5.3*  ALBUMIN 2.2*   No results for input(s): "LIPASE", "AMYLASE" in the last 168 hours. No results for input(s): "AMMONIA" in the last 168 hours. Coagulation Profile: No results for input(s): "INR", "PROTIME" in the last 168 hours. Cardiac Enzymes: No results for input(s): "CKTOTAL", "CKMB", "CKMBINDEX", "TROPONINI" in the last 168 hours. BNP (last 3 results) No results for input(s):  "PROBNP" in the last 8760 hours. HbA1C: Recent Labs    08/10/23 0558  HGBA1C 5.8*   CBG: No results for input(s): "GLUCAP" in the last 168 hours. Lipid Profile: Recent Labs    08/10/23 0558 08/11/23 0344  CHOL 146 150  HDL 27* 27*  LDLCALC 99 106*  TRIG 102 85  CHOLHDL 5.4 5.6   Thyroid Function Tests: No results for input(s): "TSH", "T4TOTAL", "FREET4", "T3FREE", "THYROIDAB" in the last 72 hours. Anemia Panel: No results for input(s): "VITAMINB12", "FOLATE", "FERRITIN", "TIBC", "IRON", "RETICCTPCT" in the last 72 hours. Sepsis Labs: Recent Labs  Lab 08/06/23 1532 08/06/23 1743  LATICACIDVEN 1.0 1.0    No results found for this or any previous visit (from the past 240 hour(s)).  Radiology Studies: VAS [place] LOWER EXTREMITY SAPHENOUS VEIN MAPPING  Result Date: 08/11/2023 LOWER EXTREMITY VEIN MAPPING Patient Name:  ALFREDO SPONG [facility]  Date of Exam:   08/10/2023 Medical Rec #: [id number]     Accession #:    [id number] Date of Birth: Mar 05, 1956      Patient Gender: M Patient Age:   52 years Exam Location:  [facility] Procedure:      VAS [place] LOWER EXTREMITY SAPHENOUS VEIN MAPPING Referring Phys: Heath Lark --------------------------------------------------------------------------------  History:            History of PAD; patient is pre-operative for lower extremity                     bypass graft. Risk Factors:       Hypertension, hyperlipidemia, coronary artery disease. Other Risk Factors: Obesity.  Performing Technologist: Marilynne Halsted RDMS, RVT  Examination Guidelines: A complete evaluation includes B-mode imaging, spectral Doppler, color Doppler, and power Doppler as needed of all accessible portions of each vessel. Bilateral testing is considered an integral part of a complete examination. Limited examinations for reoccurring indications may be performed as noted. +---------------+-----------+----------------------+---------------+-----------+   RT Diameter  RT  Findings         GSV            LT Diameter  LT Findings      (cm)                                            (cm)                  +---------------+-----------+----------------------+---------------+-----------+      0.65                     Saphenofemoral         0.84                                                   Junction                                  +---------------+-----------+----------------------+---------------+-----------+      0.43                     Proximal thigh         0.53                  +---------------+-----------+----------------------+---------------+-----------+      0.35                       Mid thigh            0.56       branching  +---------------+-----------+----------------------+---------------+-----------+      0.49       branching      Distal thigh          0.55                  +---------------+-----------+----------------------+---------------+-----------+      0.37  Knee              0.50                  +---------------+-----------+----------------------+---------------+-----------+      0.46                       Prox calf            0.46                  +---------------+-----------+----------------------+---------------+-----------+      0.38                        Mid calf            0.44                  +---------------+-----------+----------------------+---------------+-----------+      0.37                      Distal calf           0.37                  +---------------+-----------+----------------------+---------------+-----------+      0.40                         Ankle              0.39                  +---------------+-----------+----------------------+---------------+-----------+ Diagnosing physician: Coral Else MD Electronically signed by Coral Else MD on 08/11/2023 at 10:51:47 AM.    Final         Scheduled Meds:  amLODipine  10 mg Oral Daily    aspirin EC  81 mg Oral Daily   atorvastatin  40 mg Oral Daily   gabapentin  300 mg Oral BID   sodium chloride flush  3 mL Intravenous Q12H   zolpidem  5 mg Oral QHS   Continuous Infusions:   ceFAZolin (ANCEF) IV     cefTRIAXone (ROCEPHIN)  IV     heparin 2,650 Units/hr (08/12/23 0329)   metronidazole       LOS: 4 days    Time spent: 35 minutes    Jarmar Rousseau A Madelyn Tlatelpa, MD [facility]   If 7PM-7AM, please contact night-coverage [email or web address]  08/12/2023, 11:45 AM

## 2023-08-12 NOTE — Progress Notes (Signed)
PHARMACIST LIPID MONITORING   Tanner Phillips is a 67 y.o. male admitted on 08/06/2023 with severe bilateral lower extremity arterial disease.  Pharmacy has been consulted to optimize lipid-lowering therapy with the indication of secondary prevention for clinical ASCVD.  Recent Labs:  Lipid Panel (last 6 months):   Lab Results  Component Value Date   CHOL 150 08/11/2023   TRIG 85 08/11/2023   HDL 27 (L) 08/11/2023   CHOLHDL 5.6 08/11/2023   VLDL 17 08/11/2023   LDLCALC 106 (H) 08/11/2023    Hepatic function panel (last 6 months):   Lab Results  Component Value Date   AST 20 08/12/2023   ALT 21 08/12/2023   ALKPHOS 43 08/12/2023   BILITOT 0.8 08/12/2023    SCr (since admission):   Serum creatinine: 1.33 mg/dL (H) 40/98/11 9147 Estimated creatinine clearance: 64.7 mL/min (A)  Current therapy and lipid therapy tolerance Current lipid-lowering therapy: Atorvastatin 40 mg once daily Previous lipid-lowering therapies (if applicable): None Documented or reported allergies or intolerances to lipid-lowering therapies (if applicable): None  Assessment:   Patient agrees with changes to lipid-lowering therapy. This is a new medication for [name] and all questions were answered.  Plan:    1.Statin intensity (high intensity recommended for all patients regardless of the LDL):  No statin changes. The patient is already on a high intensity statin.  2.Add ezetimibe (if any one of the following):   Not indicated at this time.  3.Refer to lipid clinic:   No  4.Follow-up with:  Cardiology provider - None  5.Follow-up labs after discharge:  Changes in lipid therapy were made. Check a lipid panel in 8-12 weeks then annually.     Lennie Muckle, PharmD PGY1 Pharmacy Resident 08/12/2023 8:24 AM

## 2023-08-13 ENCOUNTER — Other Ambulatory Visit: Payer: Self-pay

## 2023-08-13 ENCOUNTER — Encounter (HOSPITAL_COMMUNITY): Payer: Self-pay | Admitting: Internal Medicine

## 2023-08-13 ENCOUNTER — Inpatient Hospital Stay (HOSPITAL_COMMUNITY): Payer: Medicare Other | Admitting: Certified Registered"

## 2023-08-13 ENCOUNTER — Encounter (HOSPITAL_COMMUNITY)
Admission: EM | Disposition: A | Discharge status: Home Health | Payer: Self-pay | Source: Home / Self Care | Attending: Internal Medicine

## 2023-08-13 DIAGNOSIS — I82409 Acute embolism and thrombosis of unspecified deep veins of unspecified lower extremity: Secondary | ICD-10-CM

## 2023-08-13 DIAGNOSIS — L97429 Non-pressure chronic ulcer of left heel and midfoot with unspecified severity: Secondary | ICD-10-CM

## 2023-08-13 DIAGNOSIS — I70249 Atherosclerosis of native arteries of left leg with ulceration of unspecified site: Secondary | ICD-10-CM

## 2023-08-13 DIAGNOSIS — M79605 Pain in left leg: Secondary | ICD-10-CM | POA: Diagnosis not present

## 2023-08-13 DIAGNOSIS — I70244 Atherosclerosis of native arteries of left leg with ulceration of heel and midfoot: Secondary | ICD-10-CM

## 2023-08-13 DIAGNOSIS — I743 Embolism and thrombosis of arteries of the lower extremities: Secondary | ICD-10-CM

## 2023-08-13 HISTORY — PX: THROMBECTOMY OF BYPASS GRAFT FEMORAL- POPLITEAL ARTERY: SHX6902

## 2023-08-13 HISTORY — PX: FEMORAL-TIBIAL BYPASS GRAFT: SHX938

## 2023-08-13 HISTORY — PX: VEIN HARVEST: SHX6363

## 2023-08-13 HISTORY — PX: APPLICATION OF WOUND VAC: SHX5189

## 2023-08-13 LAB — POCT ACTIVATED CLOTTING TIME
Activated Clotting Time: 226 s
Activated Clotting Time: 232 s
Activated Clotting Time: 256 s

## 2023-08-13 LAB — CBC
HCT: 42 % (ref 39.0–52.0)
Hemoglobin: 13.6 g/dL (ref 13.0–17.0)
MCH: 27.9 pg (ref 26.0–34.0)
MCHC: 32.4 g/dL (ref 30.0–36.0)
MCV: 86.1 fL (ref 80.0–100.0)
Platelets: 451 10*3/uL — ABNORMAL HIGH (ref 150–400)
RBC: 4.88 MIL/uL (ref 4.22–5.81)
RDW: 13.1 % (ref 11.5–15.5)
WBC: 12.5 10*3/uL — ABNORMAL HIGH (ref 4.0–10.5)
nRBC: 0 % (ref 0.0–0.2)

## 2023-08-13 LAB — SURGICAL PCR SCREEN
MRSA, PCR: NEGATIVE
Staphylococcus aureus: NEGATIVE

## 2023-08-13 LAB — TYPE AND SCREEN
ABO/RH(D): A POS
Antibody Screen: NEGATIVE

## 2023-08-13 LAB — BASIC METABOLIC PANEL
Anion gap: 10 (ref 5–15)
BUN: 13 mg/dL (ref 8–23)
CO2: 24 mmol/L (ref 22–32)
Calcium: 8.6 mg/dL — ABNORMAL LOW (ref 8.9–10.3)
Chloride: 104 mmol/L (ref 98–111)
Creatinine, Ser: 1.37 mg/dL — ABNORMAL HIGH (ref 0.61–1.24)
GFR, Estimated: 56 mL/min — ABNORMAL LOW (ref >60)
Glucose, Bld: 97 mg/dL (ref 70–99)
Potassium: 3.8 mmol/L (ref 3.5–5.1)
Sodium: 138 mmol/L (ref 135–145)

## 2023-08-13 LAB — HEPARIN LEVEL (UNFRACTIONATED): Heparin Unfractionated: 0.44 [IU]/mL (ref 0.30–0.70)

## 2023-08-13 SURGERY — CREATION, BYPASS, ARTERIAL, FEMORAL TO TIBIAL, USING GRAFT
Anesthesia: General | Site: Leg Upper | Laterality: Left

## 2023-08-13 MED ORDER — FENTANYL CITRATE (PF) 250 MCG/5ML IJ SOLN
INTRAMUSCULAR | Status: DC | PRN
  Administered 2023-08-13: 100 ug via INTRAVENOUS

## 2023-08-13 MED ORDER — ONDANSETRON HCL 4 MG/2ML IJ SOLN
INTRAMUSCULAR | Status: AC
  Filled 2023-08-13: qty 2

## 2023-08-13 MED ORDER — HEPARIN (PORCINE) 25000 UT/250ML-% IV SOLN
2750.0000 [IU]/h | INTRAVENOUS | Status: DC
  Administered 2023-08-13: 500 [IU]/h via INTRAVENOUS
  Administered 2023-08-14: 2650 [IU]/h via INTRAVENOUS
  Administered 2023-08-15: 2750 [IU]/h via INTRAVENOUS
  Filled 2023-08-13 (×3): qty 250

## 2023-08-13 MED ORDER — PHENYLEPHRINE 80 MCG/ML (10ML) SYRINGE FOR IV PUSH (FOR BLOOD PRESSURE SUPPORT)
PREFILLED_SYRINGE | INTRAVENOUS | Status: DC | PRN
  Administered 2023-08-13: 80 ug via INTRAVENOUS
  Administered 2023-08-13: 160 ug via INTRAVENOUS
  Administered 2023-08-13: 80 ug via INTRAVENOUS

## 2023-08-13 MED ORDER — ORAL CARE MOUTH RINSE: 15.0000 mL | Freq: Once | OROMUCOSAL | Status: AC

## 2023-08-13 MED ORDER — CHLORHEXIDINE GLUCONATE CLOTH 2 % EX PADS: 6.0000 | MEDICATED_PAD | Freq: Every day | CUTANEOUS | Status: DC

## 2023-08-13 MED ORDER — DEXMEDETOMIDINE HCL IN NACL 80 MCG/20ML IV SOLN
INTRAVENOUS | Status: DC | PRN
  Administered 2023-08-13 (×3): 10 ug via INTRAVENOUS

## 2023-08-13 MED ORDER — LACTATED RINGERS IV SOLN: INTRAVENOUS | Status: AC

## 2023-08-13 MED ORDER — FENTANYL CITRATE (PF) 100 MCG/2ML IJ SOLN
INTRAMUSCULAR | Status: AC
  Administered 2023-08-13: 25 ug via INTRAVENOUS
  Filled 2023-08-13: qty 2

## 2023-08-13 MED ORDER — FENTANYL CITRATE (PF) 100 MCG/2ML IJ SOLN
25.0000 ug | INTRAMUSCULAR | Status: DC | PRN
  Administered 2023-08-13: 25 ug via INTRAVENOUS

## 2023-08-13 MED ORDER — MUPIROCIN 2 % EX OINT
1.0000 | TOPICAL_OINTMENT | Freq: Two times a day (BID) | CUTANEOUS | Status: DC
  Administered 2023-08-13: 1 via NASAL
  Filled 2023-08-13: qty 22

## 2023-08-13 MED ORDER — PROPOFOL 10 MG/ML IV BOLUS
INTRAVENOUS | Status: AC
  Filled 2023-08-13: qty 20

## 2023-08-13 MED ORDER — HYDROMORPHONE HCL 1 MG/ML IJ SOLN
INTRAMUSCULAR | Status: AC
  Filled 2023-08-13: qty 0.5

## 2023-08-13 MED ORDER — PROPOFOL 10 MG/ML IV BOLUS
INTRAVENOUS | Status: DC | PRN
  Administered 2023-08-13: 200 mg via INTRAVENOUS

## 2023-08-13 MED ORDER — HEPARIN SODIUM (PORCINE) 1000 UNIT/ML IJ SOLN
INTRAMUSCULAR | Status: DC | PRN
  Administered 2023-08-13: 3000 [IU] via INTRAVENOUS
  Administered 2023-08-13: 11000 [IU] via INTRAVENOUS

## 2023-08-13 MED ORDER — CEFAZOLIN SODIUM 1 G IJ SOLR
INTRAMUSCULAR | Status: AC
  Filled 2023-08-13: qty 20

## 2023-08-13 MED ORDER — HYDROMORPHONE HCL 1 MG/ML IJ SOLN
INTRAMUSCULAR | Status: DC | PRN
  Administered 2023-08-13: .5 mg via INTRAVENOUS

## 2023-08-13 MED ORDER — MIDAZOLAM HCL 2 MG/2ML IJ SOLN
INTRAMUSCULAR | Status: DC | PRN
  Administered 2023-08-13: 2 mg via INTRAVENOUS

## 2023-08-13 MED ORDER — MORPHINE SULFATE (PF) 2 MG/ML IV SOLN
2.0000 mg | INTRAVENOUS | Status: DC | PRN
  Administered 2023-08-13 (×2): 4 mg via INTRAVENOUS
  Administered 2023-08-14 – 2023-08-15 (×2): 2 mg via INTRAVENOUS
  Administered 2023-08-15 – 2023-08-16 (×3): 4 mg via INTRAVENOUS
  Administered 2023-08-16 – 2023-08-20 (×5): 2 mg via INTRAVENOUS
  Filled 2023-08-13: qty 2
  Filled 2023-08-13 (×3): qty 1
  Filled 2023-08-13 (×2): qty 2
  Filled 2023-08-13: qty 1
  Filled 2023-08-13: qty 2
  Filled 2023-08-13: qty 1
  Filled 2023-08-13 (×2): qty 2
  Filled 2023-08-13 (×2): qty 1

## 2023-08-13 MED ORDER — CEFAZOLIN SODIUM-DEXTROSE 2-3 GM-%(50ML) IV SOLR
INTRAVENOUS | Status: DC | PRN
  Administered 2023-08-13: 2 g via INTRAVENOUS

## 2023-08-13 MED ORDER — SUGAMMADEX SODIUM 200 MG/2ML IV SOLN
INTRAVENOUS | Status: DC | PRN
  Administered 2023-08-13: 200 mg via INTRAVENOUS

## 2023-08-13 MED ORDER — HEPARIN 6000 UNIT IRRIGATION SOLUTION
Status: AC
  Filled 2023-08-13: qty 500

## 2023-08-13 MED ORDER — LIDOCAINE 2% (20 MG/ML) 5 ML SYRINGE
INTRAMUSCULAR | Status: DC | PRN
  Administered 2023-08-13: 60 mg via INTRAVENOUS

## 2023-08-13 MED ORDER — ONDANSETRON HCL 4 MG/2ML IJ SOLN
INTRAMUSCULAR | Status: DC | PRN
  Administered 2023-08-13: 4 mg via INTRAVENOUS

## 2023-08-13 MED ORDER — LIDOCAINE 2% (20 MG/ML) 5 ML SYRINGE
INTRAMUSCULAR | Status: AC
  Filled 2023-08-13: qty 5

## 2023-08-13 MED ORDER — ONDANSETRON HCL 4 MG/2ML IJ SOLN
INTRAMUSCULAR | Status: AC
Start: 2023-08-13 — End: ?
  Filled 2023-08-13: qty 2

## 2023-08-13 MED ORDER — MIDAZOLAM HCL 2 MG/2ML IJ SOLN
INTRAMUSCULAR | Status: AC
  Filled 2023-08-13: qty 2

## 2023-08-13 MED ORDER — HEPARIN 6000 UNIT IRRIGATION SOLUTION
Status: DC | PRN
  Administered 2023-08-13: 1

## 2023-08-13 MED ORDER — FENTANYL CITRATE (PF) 250 MCG/5ML IJ SOLN
INTRAMUSCULAR | Status: AC
  Filled 2023-08-13: qty 5

## 2023-08-13 MED ORDER — ROCURONIUM BROMIDE 10 MG/ML (PF) SYRINGE
PREFILLED_SYRINGE | INTRAVENOUS | Status: DC | PRN
  Administered 2023-08-13: 70 mg via INTRAVENOUS
  Administered 2023-08-13: 30 mg via INTRAVENOUS
  Administered 2023-08-13: 20 mg via INTRAVENOUS

## 2023-08-13 MED ORDER — 0.9 % SODIUM CHLORIDE (POUR BTL) OPTIME
TOPICAL | Status: DC | PRN
  Administered 2023-08-13: 1000 mL

## 2023-08-13 MED ORDER — CHLORHEXIDINE GLUCONATE 0.12 % MT SOLN
OROMUCOSAL | Status: AC
  Administered 2023-08-13: 15 mL via OROMUCOSAL
  Filled 2023-08-13: qty 15

## 2023-08-13 MED ORDER — PHENYLEPHRINE HCL-NACL 20-0.9 MG/250ML-% IV SOLN
INTRAVENOUS | Status: DC | PRN
  Administered 2023-08-13: 25 ug/min via INTRAVENOUS

## 2023-08-13 MED ORDER — ROCURONIUM BROMIDE 10 MG/ML (PF) SYRINGE
PREFILLED_SYRINGE | INTRAVENOUS | Status: AC
  Filled 2023-08-13: qty 10

## 2023-08-13 MED ORDER — ACETAMINOPHEN 500 MG PO TABS
1000.0000 mg | ORAL_TABLET | Freq: Once | ORAL | Status: AC
Start: 2023-08-13 — End: 2023-08-13
  Administered 2023-08-13: 1000 mg via ORAL
  Filled 2023-08-13: qty 2

## 2023-08-13 MED ORDER — ROCURONIUM BROMIDE 10 MG/ML (PF) SYRINGE
PREFILLED_SYRINGE | INTRAVENOUS | Status: AC
Start: 2023-08-13 — End: ?
  Filled 2023-08-13: qty 10

## 2023-08-13 MED ORDER — CHLORHEXIDINE GLUCONATE 0.12 % MT SOLN: 15.0000 mL | Freq: Once | OROMUCOSAL | Status: AC

## 2023-08-13 SURGICAL SUPPLY — 54 items
ADH SKN CLS APL DERMABOND .7 (GAUZE/BANDAGES/DRESSINGS) ×21
APL SKNCLS STERI-STRIP NONHPOA (GAUZE/BANDAGES/DRESSINGS)
BAG COUNTER SPONGE SURGICOUNT (BAG) ×3 IMPLANT
BAG SPNG CNTER NS LX DISP (BAG) ×3
BANDAGE ESMARK 6X9 LF (GAUZE/BANDAGES/DRESSINGS) IMPLANT
BENZOIN TINCTURE PRP APPL 2/3 (GAUZE/BANDAGES/DRESSINGS) ×9 IMPLANT
BNDG CMPR 9X6 STRL LF SNTH (GAUZE/BANDAGES/DRESSINGS)
BNDG ESMARK 6X9 LF (GAUZE/BANDAGES/DRESSINGS) IMPLANT
CANISTER SUCT 3000ML PPV (MISCELLANEOUS) ×3 IMPLANT
CANNULA VESSEL 3MM 2 BLNT TIP (CANNULA) ×3 IMPLANT
CATH EMB 2FR 60 (CATHETERS) IMPLANT
CATH FOLEY 2WAY SLVR 5CC 14FR (CATHETERS) IMPLANT
DERMABOND ADVANCED .7 DNX12 (GAUZE/BANDAGES/DRESSINGS) IMPLANT
DRAPE HALF SHEET 40X57 (DRAPES) IMPLANT
DRESSING PEEL AND PLC PRVNA 13 (GAUZE/BANDAGES/DRESSINGS) IMPLANT
DRSG PEEL AND PLACE PREVENA 13 (GAUZE/BANDAGES/DRESSINGS) ×3 IMPLANT
DRSG TEGADERM 4X4.75 (GAUZE/BANDAGES/DRESSINGS) IMPLANT
ELECT REM PT RETURN 9FT ADLT (ELECTROSURGICAL) ×6 IMPLANT
ELECTRODE REM PT RTRN 9FT ADLT (ELECTROSURGICAL) ×3 IMPLANT
GAUZE SPONGE 4X4 12PLY STRL (GAUZE/BANDAGES/DRESSINGS) ×3 IMPLANT
GAUZE XEROFORM 5X9 LF (GAUZE/BANDAGES/DRESSINGS) IMPLANT
GLOVE BIO SURGEON STRL SZ8 (GLOVE) ×9 IMPLANT
GOWN STRL REUS W/ TWL LRG LVL3 (GOWN DISPOSABLE) ×6 IMPLANT
GOWN STRL REUS W/ TWL XL LVL3 (GOWN DISPOSABLE) ×3 IMPLANT
GOWN STRL REUS W/TWL LRG LVL3 (GOWN DISPOSABLE) ×6
GOWN STRL REUS W/TWL XL LVL3 (GOWN DISPOSABLE) ×9
KIT BASIN OR (CUSTOM PROCEDURE TRAY) ×3 IMPLANT
KIT DRSG PREVENA PLUS 7DAY 125 (MISCELLANEOUS) IMPLANT
KIT TURNOVER KIT B (KITS) ×3 IMPLANT
MARKER SKIN DUAL TIP RULER LAB (MISCELLANEOUS) IMPLANT
NS IRRIG 1000ML POUR BTL (IV SOLUTION) ×6 IMPLANT
PACK PERIPHERAL VASCULAR (CUSTOM PROCEDURE TRAY) ×3 IMPLANT
PAD ARMBOARD 7.5X6 YLW CONV (MISCELLANEOUS) ×6 IMPLANT
PENCIL BUTTON HOLSTER BLD 10FT (ELECTRODE) IMPLANT
SHEATH PROBE COVER 6X72 (BAG) IMPLANT
SPONGE T-LAP 18X18 ~~LOC~~+RFID (SPONGE) IMPLANT
STRIP CLOSURE SKIN 1/2X4 (GAUZE/BANDAGES/DRESSINGS) ×9 IMPLANT
SUT ETHILON 3 0 PS 1 (SUTURE) IMPLANT
SUT MNCRL AB 4-0 PS2 18 (SUTURE) ×6 IMPLANT
SUT PROLENE 5 0 C 1 24 (SUTURE) ×3 IMPLANT
SUT PROLENE 6 0 BV (SUTURE) ×3 IMPLANT
SUT SILK 2 0 SH (SUTURE) ×3 IMPLANT
SUT SILK 3 0 (SUTURE) ×6
SUT SILK 3-0 18XBRD TIE 12 (SUTURE) IMPLANT
SUT VIC AB 2-0 CT1 27 (SUTURE) ×12
SUT VIC AB 2-0 CT1 TAPERPNT 27 (SUTURE) ×6 IMPLANT
SUT VIC AB 3-0 SH 27 (SUTURE) ×24
SUT VIC AB 3-0 SH 27X BRD (SUTURE) ×6 IMPLANT
TAPE UMBILICAL 1/8X30 (MISCELLANEOUS) ×3 IMPLANT
TOWEL GREEN STERILE (TOWEL DISPOSABLE) ×3 IMPLANT
TRAY FOLEY MTR SLVR 16FR STAT (SET/KITS/TRAYS/PACK) ×3 IMPLANT
UNDERPAD 30X36 HEAVY ABSORB (UNDERPADS AND DIAPERS) ×3 IMPLANT
VASCULAR TIE MINI RED 18IN STL (MISCELLANEOUS) IMPLANT
WATER STERILE IRR 1000ML POUR (IV SOLUTION) ×3 IMPLANT

## 2023-08-13 NOTE — Plan of Care (Signed)

## 2023-08-13 NOTE — Anesthesia Postprocedure Evaluation (Signed)
Anesthesia Post Note  Patient: STALIN GRUENBERG  Procedure(s) Performed: LEFT COMMON FEMORAL-ANTERIOR TIBIAL ARTERY BYPASS (Left: Leg Upper) VEIN HARVEST OF GREATER SAPHEOUS VEIN (Left: Leg Upper) LEFT ANTERIOR TIBIAL THROMBECTOMY (Left: Leg Lower) APPLICATION OF WOUND VAC (Left: Groin)     Patient location during evaluation: PACU Anesthesia Type: General Level of consciousness: awake and alert Pain management: pain level controlled Vital Signs Assessment: post-procedure vital signs reviewed and stable Respiratory status: spontaneous breathing, nonlabored ventilation, respiratory function stable and patient connected to nasal cannula oxygen Cardiovascular status: blood pressure returned to baseline and stable Postop Assessment: no apparent nausea or vomiting Anesthetic complications: no   No notable events documented.  Last Vitals:  Vitals:   08/13/23 1400 08/13/23 1415  BP: 136/85 125/76  Pulse: (!) 105 (!) 102  Resp: 20 18  Temp:    SpO2: 95% 95%    Last Pain:  Vitals:   08/13/23 1500  TempSrc:   PainSc: 10-Worst pain ever                 Earl Lites P Render Marley

## 2023-08-13 NOTE — Progress Notes (Signed)
PROGRESS NOTE    Tanner Phillips  ZOX:[id number] DOB: 1956/05/07 DOA: 08/06/2023 PCP: Ralene Ok, MD   Brief Narrative: 67 year old with past medical history significant for hypertension, lumbar disc disease, CAD status post MI, morbid obesity presents complaining of left leg pain ongoing for the last 10 days.  He also noted to have a lesion on the left heel as well as bilateral great toe.  He has also ulcers  in the intertriginous area bilaterally between fourth and fifth toes.  This has been progressively getting worse.  Patient presented with worsening left lower extremity pain redness and edema.  Patient was found to have left lower extremity DVT he was started on anticoagulation, he has been getting antibiotics for cellulitis as well.  ABIs were obtained on 10/30: Consistent with severe right and left lower extremity arterial disease. Vascular surgery consulted, he was transfer to [facility].    Underwent  arteriogram on 11/01, Underwent  left common femoral to distal anterior tibial artery bypass 11/04     Assessment & Plan:   Principal Problem:   DVT, lower extremity (HCC) Active Problems:   Cellulitis   Radiculopathy   Essential hypertension   Renal insufficiency   PAD (peripheral artery disease) (HCC)  1-Acute Left Lower Extremity DVT: -Lower extremity Doppler show acute DVT involving the left femoral vein, popliteal vein as well as posterior tibial vein and peroneal vein.  There is also SVT involving the small saphenous vein as well as intramuscular thrombosis involving the left gastrocnemius vein and left soleus vein. -He has been more sedentary over last 10 day.  - Factor V leyden, Lupus anticoagulant; no lupus anticoagulant detected, inhibitor notice. Probably related he was on eliquis/heparin. Repeat off anticoagulation. , Antithrombin 66. Rest hypercoagulable panel out patient. Advised to follow up with PCP appropriate screening for age for malignancy.  -Continue with  Heparin Gtt--plan to resume tonight after sx  2-Critical limb ischemia Severe PAD bilaterally ABIs are 0.2 on the right and 0.9 on the left He had lesions that appears to be ischemic lesions primarily on the left leg. Vascular surgery consulted. Eliquis changed to heparin for angiography  LDL 106 and A1c 5.8 pre diabetic. .  S/P arteriogram, required bypass.  On IV heparin.  Underwent Left Common femoral Artery to anterior tibial artery bypass with ipsilateral non reversed great saphenous vein tunneled through the lateral leg. Left anterior tibial Thrombectomy by Dr Chestine Spore and Dr Lenell Antu on 11/04.Marland Kitchen   Left leg cellulitis Bilateral fourth and fifth toe intertriginous ulceration suspect related to vascular disease Completed cefaxodril.  Having more redness and discoloration of toes. Will cover for infection,./ Started  Ceftriaxone and Flagyl 11/04.  Wound care consulted.   AKI versus CKD 3 A Five year agio Cr 1.5 GFR 45. Monitor renal function.  Stable.   Chronic back pain with a spondylolisthesis Follow up with Dr Lovell Sheehan out patient.  He might have to delay steroid injection.   Morbid obesity: need life style modification.   Hypertension Continue with Norvasc.   Hyperlipoidemia  lipid panel. LDL 106   Estimated body mass index is 44.93 kg/m as calculated from the following:   Height as of this encounter: 5\' 5"  (1.651 m).   Weight as of this encounter: 122.5 kg.   DVT prophylaxis: heparin gtt Code Status: Full code Family Communication: family at bedside.  Disposition Plan:  Status is: Inpatient Remains inpatient appropriate because: management of DVT and critical limb ischemia.     Consultants:  Vascular.  Procedures:  Doppler;  ABI  Antimicrobials:    Subjective: Seen in PACU. He is alert, conversant, denies dyspnea, denies pain. He is frustrated he has to go to different unit.   Objective: Vitals:   08/12/23 1447 08/12/23 2023 08/13/23 0352 08/13/23  0716  BP: 124/73 (!) 116/99 (!) 140/73 133/78  Pulse: 69 94 (!) 106 (!) 104  Resp: 20 16 18 19   Temp: 98.6 F (37 C) 98.7 F (37.1 C) (!) 97.5 F (36.4 C) 98 F (36.7 C)  TempSrc: Oral Oral Oral Axillary  SpO2: 92% 94% 92% 93%  Weight:    122.5 kg  Height:    5\' 5"  (1.651 m)    Intake/Output Summary (Last 24 hours) at 08/13/2023 0736 Last data filed at 08/13/2023 0056 Gross per 24 hour  Intake 1009.97 ml  Output 200 ml  Net 809.97 ml    Filed Weights   08/09/23 0325 08/10/23 1543 08/13/23 0716  Weight: 123 kg 126 kg 122.5 kg    Examination:  General exam: NNAD Respiratory system: CTA Cardiovascular system: S 1, S 2 RRR Gastrointestinal system: BS  present, soft, nt Central nervous system: Alert, follows command Extremities: left LE with dark discoloration heel, and discoloration of 4th and 5th toes.     Data Reviewed: I have personally reviewed following labs and imaging studies  CBC: Recent Labs  Lab 08/06/23 0927 08/07/23 0445 08/09/23 0845 08/10/23 0558 08/11/23 0344 08/12/23 0359 08/13/23 0436  WBC 12.8*   < > 9.2 10.0 11.0* 11.2* 12.5*  NEUTROABS 10.0*  --   --   --   --   --   --   HGB 14.2   < > 13.4 12.8* 12.5* 12.9* 13.6  HCT 43.6   < > 42.0 39.1 38.2* 39.4 42.0  MCV 87.9   < > 85.5 86.3 85.3 85.7 86.1  PLT 240   < > 306 360 359 399 451*   < > = values in this interval not displayed.   Basic Metabolic Panel: Recent Labs  Lab 08/08/23 0430 08/09/23 1039 08/10/23 0558 08/12/23 0359 08/13/23 0436  NA 136 139 139 138 138  K 3.9 3.7 3.8 3.7 3.8  CL 103 107 107 107 104  CO2 24 20* 23 23 24   GLUCOSE 100* 91 102* 91 97  BUN 18 13 12 12 13   CREATININE 1.40* 1.37* 1.32* 1.33* 1.37*  CALCIUM 8.3* 8.1* 7.8* 8.3* 8.6*   GFR: Estimated Creatinine Clearance: 61.8 mL/min (A) (by C-G formula based on SCr of 1.37 mg/dL (H)). Liver Function Tests: Recent Labs  Lab 08/12/23 0359  AST 20  ALT 21  ALKPHOS 43  BILITOT 0.8  PROT 5.3*  ALBUMIN 2.2*    No results for input(s): "LIPASE", "AMYLASE" in the last 168 hours. No results for input(s): "AMMONIA" in the last 168 hours. Coagulation Profile: No results for input(s): "INR", "PROTIME" in the last 168 hours. Cardiac Enzymes: No results for input(s): "CKTOTAL", "CKMB", "CKMBINDEX", "TROPONINI" in the last 168 hours. BNP (last 3 results) No results for input(s): "PROBNP" in the last 8760 hours. HbA1C: No results for input(s): "HGBA1C" in the last 72 hours.  CBG: No results for input(s): "GLUCAP" in the last 168 hours. Lipid Profile: Recent Labs    08/11/23 0344  CHOL 150  HDL 27*  LDLCALC 106*  TRIG 85  CHOLHDL 5.6   Thyroid Function Tests: No results for input(s): "TSH", "T4TOTAL", "FREET4", "T3FREE", "THYROIDAB" in the last 72 hours. Anemia Panel: No results for  input(s): "VITAMINB12", "FOLATE", "FERRITIN", "TIBC", "IRON", "RETICCTPCT" in the last 72 hours. Sepsis Labs: Recent Labs  Lab 08/06/23 1532 08/06/23 1743  LATICACIDVEN 1.0 1.0    Recent Results (from the past 240 hour(s))  Surgical PCR screen     Status: None   Collection Time: 08/13/23  3:52 AM   Specimen: Nasal Mucosa; Nasal Swab  Result Value Ref Range Status   MRSA, PCR NEGATIVE NEGATIVE Final   Staphylococcus aureus NEGATIVE NEGATIVE Final    Comment: (NOTE) The Xpert SA Assay (FDA approved for NASAL specimens in patients 38 years of age and older), is one component of a comprehensive surveillance program. It is not intended to diagnose infection nor to guide or monitor treatment. Performed at [facility], [address], [place], [place] [address]          Radiology Studies: No results found.      Scheduled Meds:  [MAR Hold] amLODipine  10 mg Oral Daily   [MAR Hold] aspirin EC  81 mg Oral Daily   [MAR Hold] atorvastatin  40 mg Oral Daily   [MAR Hold] Chlorhexidine Gluconate Cloth  6 each Topical Daily   [MAR Hold] gabapentin  300 mg Oral BID   [MAR Hold] mupirocin  ointment  1 Application Nasal BID   [MAR Hold] sodium chloride flush  3 mL Intravenous Q12H   [MAR Hold] zolpidem  5 mg Oral QHS   Continuous Infusions:  [MAR Hold] cefTRIAXone (ROCEPHIN)  IV 2 g (08/12/23 2301)   heparin 2,650 Units/hr (08/13/23 0056)   lactated ringers     [MAR Hold] metronidazole Stopped (08/12/23 2237)     LOS: 5 days    Time spent: 35 minutes    Zaryan Yakubov A Tearsa Kowalewski, MD [facility]   If 7PM-7AM, please contact night-coverage [email or web address]  08/13/2023, 7:36 AM

## 2023-08-13 NOTE — Anesthesia Procedure Notes (Signed)
Arterial Line Insertion Start/End11/01/2023 7:12 AM, 08/13/2023 7:22 AM Performed by: Atilano Median, DO, Lyliana Dicenso, Canary Brim, CRNA, CRNA  Patient location: Pre-op. Preanesthetic checklist: patient identified, IV checked, site marked, risks and benefits discussed, surgical consent, monitors and equipment checked, pre-op evaluation, timeout performed and anesthesia consent Lidocaine 1% used for infiltration Right, radial was placed Catheter size: 20 G Hand hygiene performed  and Seldinger technique used Allen's test indicative of satisfactory collateral circulation Attempts: 1 Procedure performed without using ultrasound guided technique. Following insertion, dressing applied and Biopatch. Post procedure assessment: normal  Patient tolerated the procedure well with no immediate complications.

## 2023-08-13 NOTE — Anesthesia Procedure Notes (Signed)
Procedure Name: Intubation Date/Time: 08/13/2023 7:50 AM  Performed by: Gus Puma, CRNAPre-anesthesia Checklist: Patient identified, Emergency Drugs available, Suction available and Patient being monitored Patient Re-evaluated:Patient Re-evaluated prior to induction Oxygen Delivery Method: Circle System Utilized Preoxygenation: Pre-oxygenation with 100% oxygen Induction Type: IV induction Ventilation: Mask ventilation without difficulty Laryngoscope Size: Mac and 4 Grade View: Grade II Tube type: Oral Tube size: 7.5 mm Number of attempts: 1 Airway Equipment and Method: Stylet Placement Confirmation: ETT inserted through vocal cords under direct vision, positive ETCO2 and breath sounds checked- equal and bilateral Secured at: 23 cm Tube secured with: Tape Dental Injury: Teeth and Oropharynx as per pre-operative assessment

## 2023-08-13 NOTE — Op Note (Signed)
DATE OF SERVICE: 08/13/2023  PATIENT:  Tanner Phillips  67 y.o. male  PRE-OPERATIVE DIAGNOSIS:  atherosclerosis of native arteries of right lower extremity causing ulceration  POST-OPERATIVE DIAGNOSIS:  Same  PROCEDURE:   1) left greater saphenous vein harvest 2) left common femoral to anterior tibial artery bypass with ipsilateral non-reversed greater saphenous vein in subcutaneous tunnel  CO-SURGEONS:    Chestine Spore, Canary Brim, MD - Primary    * Leonie Douglas, MD - Assisting  ASSISTANT: Aggie Moats, PA-C  An experienced assistant was required given the complexity of this procedure and the standard of surgical care. My assistant helped with exposure through counter tension, suctioning, ligation and retraction to better visualize the surgical field.  My assistant expedited sewing during the case by following my sutures. Wherever I use the term "we" in the report, my assistant actively helped me with that portion of the procedure.  ANESTHESIA:   general  EBL:  BLOOD ADMINISTERED:none  DRAINS: none   LOCAL MEDICATIONS USED:  NONE  SPECIMEN:  none  COUNTS: confirmed correct.  TOURNIQUET:  none  PATIENT DISPOSITION:  PACU - hemodynamically stable.   Delay start of Pharmacological VTE agent (>24hrs) due to surgical blood loss or risk of bleeding: no  INDICATION FOR PROCEDURE: [name] is a 67 y.o. male with left foot ulceration due to chronic limb threatening ischemia. Angiogram revealed healthy common femoral artery and distal anterior tibial artery at the ankle. After careful discussion of risks, benefits, and alternatives the patient was offered left common femoral to anterior tibial bypass with greater saphenous vein. The patient understood and wished to proceed.  OPERATIVE FINDINGS:  Healthy greater saphenous vein from the saphenofemoral junction to the ankle.  This was harvested using skip incisions.  Healthy anterior tibial artery at the ankle.  DESCRIPTION OF  PROCEDURE: After identification of the patient in the pre-operative holding area, the patient was transferred to the operating room. The patient was positioned supine on the operating room table. Anesthesia was induced. The left leg and abdomen were prepped and draped in standard fashion. A surgical pause was performed confirming correct patient, procedure, and operative location.  Ultrasound was used to map the course of the greater saphenous vein in the left leg.  Beginning at the ankle, skip incisions were made to the groin to expose the greater saphenous vein.  The incisions were carried out through subtenons tissue until the saphenous vein was encountered.  Using atraumatic technique, the saphenous vein was skeletonized from ankle to saphenofemoral junction.  All branch points, when identified, were ligated and divided.  The distal end of the vein was clamped with a right angle clamp and transected.  The stump was oversewn with 2-0 silk.  The proximal end of the graft was clamped at the saphenofemoral junction and divided.  It was passed off the table to dwell in heparinized saline solution.  The saphenofemoral junction venotomy was oversewn with 5-0 Prolene suture.  The anterior tibial artery was exposed at the ankle after first identifying the course of the artery with intraoperative ultrasound.  A longitudinal incision was made over the course the artery at the ankle and carried down through subcutaneous tissue.  The retinaculum on the anterior aspect of the ankle was divided.  The tendinous structures of the anterior compartment of the leg were retracted.  Identified the anterior tibial neurovascular bundle just prior to the tibia.  Several centimeters of anterior tibial artery were skeletonized and found to be  healthy.  I then assisted Dr. Chestine Spore with the proximal anastomosis.  I then assisted Dr. Chestine Spore with tunneling of the graft.  We used a counterincision in the distal thigh to facilitate careful  tunneling.  The graft was tunneled with great care to avoid twisting or kinking the graft.  We tested the graft with before-and-after tunneling and found pulsatile flow through the graft which was unchanged.  Dr. Chestine Spore then performed the distal anastomosis which will be dictated separately.  Rande Brunt. Lenell Antu, MD [facility] Phone Number: [phone] 08/13/2023 12:04 PM

## 2023-08-13 NOTE — Op Note (Signed)
Date: August 13, 2023  Preoperative diagnosis: Critical limb ischemia of the left lower extremity with tissue loss (left 4th, 5th toes and heel)  Postoperative diagnosis: Same  Procedure: 1.  Harvest of left leg great saphenous vein 2.  Left common femoral artery to anterior tibial artery bypass with ipsilateral nonreversed great saphenous vein tunneled through the lateral leg 3.  Left anterior tibial thrombectomy 4.  Incisional vac to left groin   Surgeon: Dr. Cephus Shelling, MD  Co-surgeon: Dr. Heath Lark, MD  Assistant: Aggie Moats, PA  Indications: 67 year old male that I saw in consultation last week at [facility] being treated for a DVT.  There was concern for underlying peripheral arterial disease given he developed a heel ulcer as well as an ulcer between his left fourth and fifth toes.  His ABIs were 0.2.  He was transferred for [facility] for arteriogram showing multilevel occlusive disease and his only option was a common femoral artery to anterior tibial bypass.  He presents after risks benefits discussed.  Remains high risk for limb loss.  A co-surgeon was needed given the complexity of the case including vein harvest, morbid obesity, deep common femoral artery, distal bypass of the ankle.  Findings: The left great saphenous vein was harvested from the saphenofemoral junction all the way down to the foot and was of excellent caliber through skip incisions.  This was then sewn end to side to the left common femoral artery and then tunneled lateral in the thigh/calf to the anterior tibial.  We did have to make a counterincision in the thigh given the tunneler was not long enough to reach from the target.  The bypass was sewn in nonreversed fashion and we used a valvulotome to lyse all the valves.  When I open the anterior tibial at the ankle there was acute thrombus.  I was able to thrombectomize anterior tibial both antegrade and retrograde.  We got good backbleeding.  I passed  a #2.5 dilator distally.  Brisk DP signal at completion after end to side anastomosis to the left anterior tibial artery.  Anesthesia: General  Details: Patient was taken to the operating room after informed consent was obtained.  Placed on the operative table in the supine position.  General endotracheal anesthesia was induced.  Foley catheter was inserted.  I did use ultrasound and marked out the great saphenous vein in the left thigh and calf and this was of excellent caliber.  The left groin, left leg, and left foot were all prepped draped standard sterile fashion.  Antibiotics were given and timeout performed.  Initially started in the left groin with a horizontal groin incision above the inguinal crease.  Dissected down Bovie cautery and used cerebellar retractors.  Patient is morbidly obese so exposure of the common femoral was challenging.  We had used hand-held Wiley retractors for visualization.  Ultimately the common femoral artery was dissected out as well as the distal external iliac SFA and profunda.  This was soft and we got Vesseloops on the SFA and profunda and a clamp site on the distal external iliac.  While I was doing this Dr. Lenell Antu exposed the anterior tibial at the ankle where the artery reconstituted on angiogram through a longitudinal incision at the ankle.  He then harvested the left great saphenous vein in the calf through the skip incisions while we harvested saphenous vein in the thigh through skip incisions.  All side branches were ligated between 2-0 and 3-0 silk ties and divided.  We then harvested this all the way between the skin bridges all the way up to the saphenofemoral junction.  Distally we harvested all the way into the foot.  We then ligated this over right angle clamp passed between the skin tunnels and then ligated at the saphenofemoral junction and then oversewed the stump with a 5-0 Prolene.  The vein was then brought on the field and it was dilated with heparinized  saline and we had to repair several branches with 6-0 Prolene. The vein was of excellent caliber.  Patient was given 100 units/kg IV heparin and we checked an ACT to maintain greater than 250.  I used Vesseloops on the SFA and profunda Henley clamp on the distal external iliac.  The left common femoral artery was opened 11 blade scalpel and Potts scissors.  We then spatulated the vein and it was sewn end to side to the left common femoral artery 5-0 Prolene parachute technique.  We de-aired the artery prior to completion.  Once we came off clamps there was obviously no pulsatile flow distally because we had done this in nonreversed fashion.  I then used a valvulotome and carefully lysed all the valves until we had good pulsatile flow distally and I passed this a total of 2 times.  I had already tunneled from the anterior tibial up to the groin with a large gore tunneled prior to heparin but had to make a counterincision in the thigh as the tunnel was not long enough.  We were careful to pass the vein graft through the tunnel initially to the counterincision and then on down to the anterior tibial target tunnel laterally in the thigh and calf and making sure to maintain orientation of the vein graft.  We had good one-to-one tension.  There was good pulsatile flow distally.  The anterior tibial was then controlled with Vesseloops and this was opened 11 blade scalpel Potts scissors.  There was acute thrombus here and I removed this under direct visualization and then passed a Fogarty into the foot and did not get any more acute thrombus but I did get acute thrombus retrograde passing a Fogarty catheter in the more proximal anterior tibial artery.  I then passed a #2.5 dilator distally in the AT with no resistance.  We then sewed the vein graft with an end to side anastomosis to the left anterior tibial at the ankle with a 6-0 Prolene parachute technique and de-aired this prior to completion.  We had excellent dorsalis  pedis signal with a palpable pulse in the bypass graft.  Given thrombus in the anterior tibial elected not to reverse the heparin.  All the incisions were then closed in multiple layers of 2-0 Vicryl, 3-0 Vicryl, 4-0 Monocryl and Dermabond including the vein harvest in the groin incision.  Distally the anterior tibial exposure incision was closed with horizontal mattress using nylons as well as the distal vein harvest incision as there was significant tension here also with nylon horizontal mattress.  An incisional vac was placed in the left groin.  Awakened from anesthesia and taken to recovery.  Complication: None  Condition: Stable  Cephus Shelling, MD [facility] [place] Office: [phone]   Cephus Shelling

## 2023-08-13 NOTE — Progress Notes (Signed)
[facility] [place]  Subjective  - foot hurts   Objective 133/78 (!) 104 98 F (36.7 C) (Axillary) 19 93%  Intake/Output Summary (Last 24 hours) at 08/13/2023 0718 Last data filed at 08/13/2023 0056 Gross per 24 hour  Intake 1009.97 ml  Output 400 ml  Net 609.97 ml      Laboratory Lab Results: Recent Labs    08/12/23 0359 08/13/23 0436  WBC 11.2* 12.5*  HGB 12.9* 13.6  HCT 39.4 42.0  PLT 399 451*   BMET Recent Labs    08/12/23 0359 08/13/23 0436  NA 138 138  K 3.7 3.8  CL 107 104  CO2 23 24  GLUCOSE 91 97  BUN 12 13  CREATININE 1.33* 1.37*  CALCIUM 8.3* 8.6*    COAG Lab Results  Component Value Date   INR 1.6 (H) 02/16/2008   INR 1.5 02/15/2008   INR 1.4 02/14/2008   No results found for: "PTT"  Assessment/Planning:  Discussed plan for left common femoral to AT bypass for CLI with tissue loss.  Risks and benefits discussed.  Remains high risk for limb loss.  Cephus Shelling 08/13/2023 7:18 AM --

## 2023-08-13 NOTE — Transfer of Care (Signed)
Immediate Anesthesia Transfer of Care Note  Patient: Tanner Phillips  Procedure(s) Performed: LEFT COMMON FEMORAL-ANTERIOR TIBIAL ARTERY BYPASS (Left: Leg Upper) VEIN HARVEST OF GREATER SAPHEOUS VEIN (Left: Leg Upper) LEFT ANTERIOR TIBIAL THROMBECTOMY (Left: Leg Lower) APPLICATION OF WOUND VAC (Left: Groin)  Patient Location: PACU  Anesthesia Type:General  Level of Consciousness: drowsy, pateint uncooperative, and confused  Airway & Oxygen Therapy: Patient Spontanous Breathing -- patient refusing oxygen support on his face  Post-op Assessment: Report given to RN, Post -op Vital signs reviewed and stable, and Patient moving all extremities  Post vital signs: Reviewed and stable  Last Vitals:  Vitals Value Taken Time  BP 120/71 08/13/23 1230  Temp    Pulse 102 08/13/23 1232  Resp 16 08/13/23 1232  SpO2 92 % 08/13/23 1232  Vitals shown include unfiled device data.  Last Pain:  Vitals:   08/13/23 0716  TempSrc: Axillary  PainSc: 6       Patients Stated Pain Goal: 1 (08/13/23 0716)  Complications: No notable events documented.

## 2023-08-14 ENCOUNTER — Encounter (HOSPITAL_COMMUNITY): Payer: Self-pay | Admitting: Vascular Surgery

## 2023-08-14 DIAGNOSIS — M79605 Pain in left leg: Secondary | ICD-10-CM | POA: Diagnosis not present

## 2023-08-14 DIAGNOSIS — Z95828 Presence of other vascular implants and grafts: Secondary | ICD-10-CM

## 2023-08-14 LAB — CBC
HCT: 40.5 % (ref 39.0–52.0)
Hemoglobin: 12.9 g/dL — ABNORMAL LOW (ref 13.0–17.0)
MCH: 27.6 pg (ref 26.0–34.0)
MCHC: 31.9 g/dL (ref 30.0–36.0)
MCV: 86.5 fL (ref 80.0–100.0)
Platelets: 426 10*3/uL — ABNORMAL HIGH (ref 150–400)
RBC: 4.68 MIL/uL (ref 4.22–5.81)
RDW: 13.2 % (ref 11.5–15.5)
WBC: 15.7 10*3/uL — ABNORMAL HIGH (ref 4.0–10.5)
nRBC: 0 % (ref 0.0–0.2)

## 2023-08-14 LAB — BASIC METABOLIC PANEL
Anion gap: 11 (ref 5–15)
BUN: 10 mg/dL (ref 8–23)
CO2: 25 mmol/L (ref 22–32)
Calcium: 8.3 mg/dL — ABNORMAL LOW (ref 8.9–10.3)
Chloride: 101 mmol/L (ref 98–111)
Creatinine, Ser: 1.54 mg/dL — ABNORMAL HIGH (ref 0.61–1.24)
GFR, Estimated: 49 mL/min — ABNORMAL LOW (ref >60)
Glucose, Bld: 114 mg/dL — ABNORMAL HIGH (ref 70–99)
Potassium: 3.6 mmol/L (ref 3.5–5.1)
Sodium: 137 mmol/L (ref 135–145)

## 2023-08-14 LAB — HEPARIN LEVEL (UNFRACTIONATED)
Heparin Unfractionated: <0.1 [IU]/mL — ABNORMAL LOW (ref 0.30–0.70)
Heparin Unfractionated: 0.37 [IU]/mL (ref 0.30–0.70)

## 2023-08-14 MED ORDER — METOPROLOL TARTRATE 5 MG/5ML IV SOLN
5.0000 mg | Freq: Four times a day (QID) | INTRAVENOUS | Status: DC | PRN
  Administered 2023-08-14: 5 mg via INTRAVENOUS
  Filled 2023-08-14: qty 5

## 2023-08-14 MED ORDER — ENSURE ENLIVE PO LIQD
237.0000 mL | Freq: Two times a day (BID) | ORAL | Status: DC
  Administered 2023-08-15 – 2023-08-20 (×7): 237 mL via ORAL

## 2023-08-14 MED ORDER — FUROSEMIDE 10 MG/ML IJ SOLN
40.0000 mg | Freq: Once | INTRAMUSCULAR | Status: AC
  Administered 2023-08-14: 40 mg via INTRAVENOUS
  Filled 2023-08-14: qty 4

## 2023-08-14 MED ORDER — METOPROLOL TARTRATE 5 MG/5ML IV SOLN: 5.0000 mg | Freq: Four times a day (QID) | INTRAVENOUS | Status: DC | PRN

## 2023-08-14 MED ORDER — LINEZOLID 600 MG/300ML IV SOLN
600.0000 mg | Freq: Two times a day (BID) | INTRAVENOUS | Status: AC
  Administered 2023-08-14 – 2023-08-17 (×8): 600 mg via INTRAVENOUS
  Filled 2023-08-14 (×8): qty 300

## 2023-08-14 MED ORDER — LACTATED RINGERS IV SOLN: INTRAVENOUS | Status: DC

## 2023-08-14 NOTE — Progress Notes (Signed)
Called by nursing patient developed large blister along the entire lower third anterior surface left foot.  No evidence of infection.  Contained blister.  Likely secondary to edema/swelling.  Elevation recommended.  Single dose IV Lasix 40mg  given.

## 2023-08-14 NOTE — Progress Notes (Signed)
   08/14/23 2204  Provider Notification  Provider Name/Title Crosley MD  Date Provider Notified 08/14/23  Time Provider Notified 2206  Method of Notification Page  Notification Reason Other (Comment) (HR elevated)

## 2023-08-14 NOTE — Progress Notes (Signed)
PHARMACY - ANTICOAGULATION CONSULT NOTE  Pharmacy Consult for Heparin Indication:  acute LLE DVT  No Known Allergies  Patient Measurements: Height: 5\' 5"  (165.1 cm) Weight: 122.5 kg (270 lb) IBW/kg (Calculated) : 61.5 Heparin Dosing Weight: 90 kg  Vital Signs: Temp: 98.2 F (36.8 C) (11/05 0746) Temp Source: Oral (11/05 0746) BP: 122/55 (11/05 0746) Pulse Rate: 130 (11/05 0746)  Labs: Recent Labs    08/11/23 1221 08/11/23 2052 08/12/23 0359 08/13/23 0436 08/14/23 0611  HGB  --   --  12.9* 13.6  --   HCT  --   --  39.4 42.0  --   PLT  --   --  399 451*  --   APTT 66* 71* 64*  --   --   HEPARINUNFRC  --   --  0.44 0.44 <0.10*  CREATININE  --   --  1.33* 1.37*  --     Estimated Creatinine Clearance: 61.8 mL/min (A) (by C-G formula based on SCr of 1.37 mg/dL (H)).   Assessment: 67 YO male presenting 10/28 with a new LLE DVT nvolving the left femoral vein, popliteal vein as well as posterior tibial vein and peroneal vein.  There is also SVT involving the small saphenous vein as well as intramuscular thrombosis involving the left gastrocnemius vein and left soleus vein.  He also has ischemic changes on the left heel with ulceration as well as ischemic changes with ulceration between the fourth and fifth toes in the left foot--suggestive of underlying PAD. Pharmacy was originally consulted to dose Eliquis (last dose 10/30 @1005 ) but was transitioned to IV Heparin on 08/08/23 in anticipation of lower extremity arteriogram (tentative plan for this Friday 08/10/23 at [facility]).  11/5 AM: Patient tolerated heparin overnight at 500 unit/s hour without hematoma at incision sites per VVS. Patient likely going to require amputation of toes, but [facility] planning to allow for demarcation. Based on tolerance of heparin ovbernight, OK to increase back to therapeutic heparin without bolus. Will plan on restarting at previously therapeutic rate. Heparin level expectedly undetectable this AM at a  much lower heparin rate.   Goal of Therapy:  Heparin level 0.3-0.7 units/ml Monitor platelets by anticoagulation protocol: Yes   Plan:  Increase heparin drip back to 2650 units/hr- no bolus  F/U heparin level in 6 hours  Daily heparin level and CBC Follow-up transition back to Eliquis following procedures  Jani Gravel, PharmD Clinical Pharmacist  08/14/2023 8:46 AM

## 2023-08-14 NOTE — Progress Notes (Signed)
[facility] [place]  Subjective  -developed a blister on his left foot overnight.   Objective (!) 141/88 (!) 108 98.4 F (36.9 C) (Oral) 20 92%  Intake/Output Summary (Last 24 hours) at 08/14/2023 0637 Last data filed at 08/14/2023 1610 Gross per 24 hour  Intake 1644.82 ml  Output 1275 ml  Net 369.82 ml    Left groin incision no hematoma with VAC Left leg vein harvest incisions intact Left distal anterior tibial incision for exposure of the target intact with no hematoma Palpable DP pulse left foot Ischemic changes to the left fourth and fifth toes as well as the heel with large blister  Laboratory Lab Results: Recent Labs    08/12/23 0359 08/13/23 0436  WBC 11.2* 12.5*  HGB 12.9* 13.6  HCT 39.4 42.0  PLT 399 451*   BMET Recent Labs    08/12/23 0359 08/13/23 0436  NA 138 138  K 3.7 3.8  CL 107 104  CO2 23 24  GLUCOSE 91 97  BUN 12 13  CREATININE 1.33* 1.37*  CALCIUM 8.3* 8.6*    COAG Lab Results  Component Value Date   INR 1.6 (H) 02/16/2008   INR 1.5 02/15/2008   INR 1.4 02/14/2008   No results found for: "PTT"  Assessment/Planning:  67 year old male now postop day 1 status post left common femoral to anterior tibial bypass at the ankle including thrombectomy of the anterior tibial for CLI with tissue loss. He has a palpable pulse in his foot today and the bypass graft is widely patent.  Tolerated heparin at 500 units an hour overnight.  Would be okay increasing his heparin today without a bolus.  Will have to watch how his foot demarcates and discussed he likely will require fourth and fifth toe applications at a minimum but again I want to allow the foot to demarcate now that he has revascularization.  Cephus Shelling 08/14/2023 6:37 AM --

## 2023-08-14 NOTE — Progress Notes (Signed)
  Progress Note    08/14/2023 3:16 PM 1 Day Post-Op  Subjective:  denies any pain in his foot.  Wife states the swelling in the left leg is better but the toes look worse.   Tm 99.8  Vitals:   08/14/23 0900 08/14/23 1100  BP: 136/70 136/72  Pulse: 93 83  Resp: (!) 24   Temp: 97.6 F (36.4 C) 99.8 F (37.7 C)  SpO2:  93%    Physical Exam: General:  no distress Cardiac:  regular Lungs:  non labored Incisions:  left groin with prevena; other incisions look good Extremities:  palpable left DP pulse         CBC    Component Value Date/Time   WBC 15.7 (H) 08/14/2023 0758   RBC 4.68 08/14/2023 0758   HGB 12.9 (L) 08/14/2023 0758   HCT 40.5 08/14/2023 0758   PLT 426 (H) 08/14/2023 0758   MCV 86.5 08/14/2023 0758   MCH 27.6 08/14/2023 0758   MCHC 31.9 08/14/2023 0758   RDW 13.2 08/14/2023 0758   LYMPHSABS 1.2 08/06/2023 0927   MONOABS 1.3 (H) 08/06/2023 0927   EOSABS 0.1 08/06/2023 0927   BASOSABS 0.1 08/06/2023 0927    BMET    Component Value Date/Time   NA 137 08/14/2023 0758   K 3.6 08/14/2023 0758   CL 101 08/14/2023 0758   CO2 25 08/14/2023 0758   GLUCOSE 114 (H) 08/14/2023 0758   BUN 10 08/14/2023 0758   CREATININE 1.54 (H) 08/14/2023 0758   CALCIUM 8.3 (L) 08/14/2023 0758   GFRNONAA 49 (L) 08/14/2023 0758   GFRAA 52 (L) 05/21/2017 0010    INR    Component Value Date/Time   INR 1.6 (H) 02/16/2008 0525     Intake/Output Summary (Last 24 hours) at 08/14/2023 1516 Last data filed at 08/14/2023 1100 Gross per 24 hour  Intake 244.82 ml  Output 2700 ml  Net -2455.18 ml      Assessment/Plan:  67 y.o. male is s/p:   left common femoral to anterior tibial bypass at the ankle including thrombectomy of the anterior tibial for CLI with tissue loss.   1 Day Post-Op   -called to see pt for worsening toes.  He continues to have palpable DP pulse.  Encouraged him to continue elevating his legs with his back flat and ankle above knee and knee above  groin.  Also will put dry gauze between toes.  He will continue to float his heels off the bed.   -discussed with he and his wife that the toes and foot need time to demarcate and time will tell what heals and what doesn't.   -he was unable to ambulate today due to the blistering on the bottom of his foot.  Will benefit from compression at some point.   -continue asa/statin -DVT prophylaxis:  heparin gtt   Doreatha Massed, PA-C [facility] [phone] 08/14/2023 3:16 PM

## 2023-08-14 NOTE — Progress Notes (Signed)
PROGRESS NOTE    Tanner Phillips  ZOX:[id number] DOB: 04-06-1956 DOA: 08/06/2023 PCP: Ralene Ok, MD   Brief Narrative: 67 year old with past medical history significant for hypertension, lumbar disc disease, CAD status post MI, morbid obesity presents complaining of left leg pain ongoing for the last 10 days.  He also noted to have a lesion on the left heel as well as bilateral great toe.  He has also ulcers  in the intertriginous area bilaterally between fourth and fifth toes.  This has been progressively getting worse.  Patient presented with worsening left lower extremity pain redness and edema.  Patient was found to have left lower extremity DVT he was started on anticoagulation, he has been getting antibiotics for cellulitis as well.  ABIs were obtained on 10/30: Consistent with severe right and left lower extremity arterial disease. Vascular surgery consulted, he was transfer to [facility].    Underwent  arteriogram on 11/01, Underwent  left common femoral to distal anterior tibial artery bypass 11/04     Assessment & Plan:   Principal Problem:   DVT, lower extremity (HCC) Active Problems:   Cellulitis   Radiculopathy   Essential hypertension   Renal insufficiency   PAD (peripheral artery disease) (HCC)  1-Acute Left Lower Extremity DVT: -Lower extremity Doppler show acute DVT involving the left femoral vein, popliteal vein as well as posterior tibial vein and peroneal vein.  There is also SVT involving the small saphenous vein as well as intramuscular thrombosis involving the left gastrocnemius vein and left soleus vein. -He has been more sedentary over last 10 day.  - Factor V leyden, Lupus anticoagulant; no lupus anticoagulant detected, inhibitor notice. Probably related he was on eliquis/heparin. Repeat off anticoagulation.  Antithrombin 66. Rest hypercoagulable panel out patient. Advised to follow up with PCP appropriate screening for age for malignancy.  -Continue with  Heparin Gtt.-transition to Eliquis when clear by vascular.   2-Critical limb ischemia Severe PAD bilaterally ABIs are 0.2 on the right and 0.9 on the left He had lesions that appears to be ischemic lesions primarily on the left leg. Vascular surgery consulted. Eliquis changed to heparin for angiography  LDL 106 and A1c 5.8 pre diabetic. .  S/P arteriogram, required bypass.  On IV heparin.  Underwent Left Common femoral Artery to anterior tibial artery bypass with ipsilateral non reversed great saphenous vein tunneled through the lateral leg. Left anterior tibial Thrombectomy by Dr Chestine Spore and Dr Lenell Antu on 11/04.Marland Kitchen   Left leg cellulitis Bilateral fourth and fifth toe intertriginous ulceration suspect related to vascular disease Completed cefaxodril.  Started  Ceftriaxone and Flagyl 11/04 due to  recurrence redness.  Wound care following.  11/05: Will add Linezolid to cover for MRSA.   AKI versus CKD 3 A Five year agio Cr 1.5 GFR 45. Monitor renal function.  Cr down to 1.3--increase to 1.5 after IV lasix received overnight. Will resume IV fluids for 24 hours.   Chronic back pain with a spondylolisthesis Follow up with Dr Lovell Sheehan out patient.  He might have to delay steroid injection.   Morbid obesity: need life style modification.   Hypertension Continue with Norvasc.   Hyperlipoidemia  lipid panel. LDL 106   Estimated body mass index is 44.93 kg/m as calculated from the following:   Height as of this encounter: 5\' 5"  (1.651 m).   Weight as of this encounter: 122.5 kg.   DVT prophylaxis: heparin gtt Code Status: Full code Family Communication: family at bedside.  Disposition Plan:  Status is: Inpatient Remains inpatient appropriate because: management of DVT and critical limb ischemia.     Consultants:  Vascular.   Procedures:  Doppler;  ABI  Antimicrobials:    Subjective: He is feeling ok, denies worsening foot pain. He develops large blister left foot  overnight.  Has some more redness Left LE  Objective: Vitals:   08/14/23 0746 08/14/23 0847 08/14/23 0900 08/14/23 1100  BP: (!) 122/55  136/70 136/72  Pulse: (!) 130  93 83  Resp: 16  (!) 24   Temp: 98.2 F (36.8 C)  97.6 F (36.4 C) 99.8 F (37.7 C)  TempSrc: Oral  Oral Oral  SpO2: 94% 98%  93%  Weight:      Height:        Intake/Output Summary (Last 24 hours) at 08/14/2023 1348 Last data filed at 08/14/2023 1100 Gross per 24 hour  Intake 244.82 ml  Output 2775 ml  Net -2530.18 ml    Filed Weights   08/09/23 0325 08/10/23 1543 08/13/23 0716  Weight: 123 kg 126 kg 122.5 kg    Examination:  General exam: NAD Respiratory system: CTA Cardiovascular system: S 1, S 2 RRR Gastrointestinal system: BS present, soft, nt.  Central nervous system: Alert Extremities: left LE with dark discoloration heel, and discoloration of 4th and 5th toes.  Redness left foot large blister.    Data Reviewed: I have personally reviewed following labs and imaging studies  CBC: Recent Labs  Lab 08/10/23 0558 08/11/23 0344 08/12/23 0359 08/13/23 0436 08/14/23 0758  WBC 10.0 11.0* 11.2* 12.5* 15.7*  HGB 12.8* 12.5* 12.9* 13.6 12.9*  HCT 39.1 38.2* 39.4 42.0 40.5  MCV 86.3 85.3 85.7 86.1 86.5  PLT 360 359 399 451* 426*   Basic Metabolic Panel: Recent Labs  Lab 08/09/23 1039 08/10/23 0558 08/12/23 0359 08/13/23 0436 08/14/23 0758  NA 139 139 138 138 137  K 3.7 3.8 3.7 3.8 3.6  CL 107 107 107 104 101  CO2 20* 23 23 24 25   GLUCOSE 91 102* 91 97 114*  BUN 13 12 12 13 10   CREATININE 1.37* 1.32* 1.33* 1.37* 1.54*  CALCIUM 8.1* 7.8* 8.3* 8.6* 8.3*   GFR: Estimated Creatinine Clearance: 55 mL/min (A) (by C-G formula based on SCr of 1.54 mg/dL (H)). Liver Function Tests: Recent Labs  Lab 08/12/23 0359  AST 20  ALT 21  ALKPHOS 43  BILITOT 0.8  PROT 5.3*  ALBUMIN 2.2*   No results for input(s): "LIPASE", "AMYLASE" in the last 168 hours. No results for input(s): "AMMONIA"  in the last 168 hours. Coagulation Profile: No results for input(s): "INR", "PROTIME" in the last 168 hours. Cardiac Enzymes: No results for input(s): "CKTOTAL", "CKMB", "CKMBINDEX", "TROPONINI" in the last 168 hours. BNP (last 3 results) No results for input(s): "PROBNP" in the last 8760 hours. HbA1C: No results for input(s): "HGBA1C" in the last 72 hours.  CBG: No results for input(s): "GLUCAP" in the last 168 hours. Lipid Profile: No results for input(s): "CHOL", "HDL", "LDLCALC", "TRIG", "CHOLHDL", "LDLDIRECT" in the last 72 hours.  Thyroid Function Tests: No results for input(s): "TSH", "T4TOTAL", "FREET4", "T3FREE", "THYROIDAB" in the last 72 hours. Anemia Panel: No results for input(s): "VITAMINB12", "FOLATE", "FERRITIN", "TIBC", "IRON", "RETICCTPCT" in the last 72 hours. Sepsis Labs: No results for input(s): "PROCALCITON", "LATICACIDVEN" in the last 168 hours.   Recent Results (from the past 240 hour(s))  Surgical PCR screen     Status: None   Collection Time: 08/13/23  3:52 AM  Specimen: Nasal Mucosa; Nasal Swab  Result Value Ref Range Status   MRSA, PCR NEGATIVE NEGATIVE Final   Staphylococcus aureus NEGATIVE NEGATIVE Final    Comment: (NOTE) The Xpert SA Assay (FDA approved for NASAL specimens in patients 42 years of age and older), is one component of a comprehensive surveillance program. It is not intended to diagnose infection nor to guide or monitor treatment. Performed at [facility], [address] [place], [place] [address]          Radiology Studies: No results found.      Scheduled Meds:  amLODipine  10 mg Oral Daily   aspirin EC  81 mg Oral Daily   atorvastatin  40 mg Oral Daily   feeding supplement  237 mL Oral BID BM   gabapentin  300 mg Oral BID   mupirocin ointment  1 Application Nasal BID   sodium chloride flush  3 mL Intravenous Q12H   zolpidem  5 mg Oral QHS   Continuous Infusions:  cefTRIAXone (ROCEPHIN)  IV Stopped  (08/13/23 2258)   heparin 2,650 Units/hr (08/14/23 0929)   lactated ringers 75 mL/hr at 08/14/23 1227   linezolid (ZYVOX) IV     metronidazole 500 mg (08/14/23 0936)     LOS: 6 days    Time spent: 35 minutes    Mansour Balboa A Mack Alvidrez, MD [facility]   If 7PM-7AM, please contact night-coverage [email or web address]  08/14/2023, 1:48 PM

## 2023-08-14 NOTE — Progress Notes (Addendum)
PHARMACY - ANTICOAGULATION CONSULT NOTE  Pharmacy Consult for Heparin Indication:  acute LLE DVT  No Known Allergies  Patient Measurements: Height: 5\' 5"  (165.1 cm) Weight: 122.5 kg (270 lb) IBW/kg (Calculated) : 61.5 Heparin Dosing Weight: 90 kg  Vital Signs: Temp: 98.8 F (37.1 C) (11/05 1556) Temp Source: Oral (11/05 1556) BP: 151/69 (11/05 1556) Pulse Rate: 61 (11/05 1556)  Labs: Recent Labs    08/11/23 2052 08/12/23 0359 08/12/23 0359 08/13/23 0436 08/14/23 0611 08/14/23 0758 08/14/23 1504  HGB  --  12.9*   < > 13.6  --  12.9*  --   HCT  --  39.4  --  42.0  --  40.5  --   PLT  --  399  --  451*  --  426*  --   APTT 71* 64*  --   --   --   --   --   HEPARINUNFRC  --  0.44   < > 0.44 <0.10*  --  0.37  CREATININE  --  1.33*  --  1.37*  --  1.54*  --    < > = values in this interval not displayed.    Estimated Creatinine Clearance: 55 mL/min (A) (by C-G formula based on SCr of 1.54 mg/dL (H)).   Assessment: 67 YO male presenting 10/28 with a new LLE DVT nvolving the left femoral vein, popliteal vein as well as posterior tibial vein and peroneal vein.  There is also SVT involving the small saphenous vein as well as intramuscular thrombosis involving the left gastrocnemius vein and left soleus vein.  He also has ischemic changes on the left heel with ulceration as well as ischemic changes with ulceration between the fourth and fifth toes in the left foot--suggestive of underlying PAD.  Patient is s/p femoral to tibial bypass 11/4. Pharmacy consulted for heparin.  Heparin level 0.37 is at low end of therapeutic on 2650 units/hr. Level drawn 5.5hr from rate increase so not quite at steady state. Patient was previously at heparin level 0.44 on this rate. Patient lost IV access and heparin bag was dry for an unknown amount of time per RN. Heparin restarted at 1625. No bleeding per RN. Will increase slightly given worsening toe discoloration to target ~0.5.    Goal of  Therapy:  Heparin level 0.3-0.7 units/ml Monitor platelets by anticoagulation protocol: Yes   Plan:  Increase heparin to 2750 units/hr  F/U heparin level in 8 hours  Daily heparin level and CBC Follow-up transition back to Eliquis following procedures   Alphia Moh, PharmD, BCPS, BCCP Clinical Pharmacist  Please check AMION for all [facility]acy phone numbers After 10:00 PM, call Main Pharmacy [phone]

## 2023-08-14 NOTE — Evaluation (Signed)
Physical Therapy Re-Evaluation Patient Details Name: Tanner Phillips MRN: [id number] DOB: [birth year] Today's Date: 08/14/2023  History of Present Illness  Pt is a 67 yo male admitted with worsening LE cellulitis and found to have LLE DVT. S/p LLE angiogram 11/01. Underwent L common femoral to ant tib bypass with L greater sapenous vein harvest and thrombectomy of anterior tib artery on 08/13/23. PHM: ruptured disk in back, HTN, CAD s/p MI  Clinical Impression  Patient post procedure for revascularization presents with pain, decreased strength/ROM and progressing ischemic changes in L foot with possible need for further intervention.  Today with blister on plantar aspect of foot so no pressure on foot and pt able to dangle EOB.  Noted wife unable to help much so concern for home unless markedly improved.  Recommend inpatient rehab (<3 hours/day) prior to d/c home.  PT will continue to follow.        If plan is discharge home, recommend the following: Assist for transportation;Assistance with cooking/housework;A little help with bathing/dressing/bathroom;A lot of help with walking and/or transfers   Can travel by private vehicle        Equipment Recommendations None recommended by PT  Recommendations for Other Services       Functional Status Assessment Patient has had a recent decline in their functional status and demonstrates the ability to make significant improvements in function in a reasonable and predictable amount of time.     Precautions / Restrictions Precautions Precautions: Fall Precaution Comments: wound vac L groin Restrictions LLE Weight Bearing: Weight bearing as tolerated      Mobility  Bed Mobility Overal bed mobility: Needs Assistance Bed Mobility: Supine to Sit, Sit to Supine     Supine to sit: HOB elevated, Used rails, Min assist Sit to supine: Min assist   General bed mobility comments: increased time, effortful and painful, assist for positioning, lines,  etc.    Transfers                   General transfer comment: NT as blister on bottom of L foot, per MD    Ambulation/Gait                  Stairs            Wheelchair Mobility     Tilt Bed    Modified Rankin (Stroke Patients Only)       Balance Overall balance assessment: Needs assistance   Sitting balance-Leahy Scale: Good         Standing balance comment: NT                             Pertinent Vitals/Pain Pain Assessment Pain Score: 8  Pain Location: L leg in dependent position Pain Descriptors / Indicators: Discomfort, Throbbing, Burning Pain Intervention(s): Monitored during session, Repositioned, Limited activity within patient's tolerance    Home Living Family/patient expects to be discharged to:: Private residence Living Arrangements: Spouse/significant other Available Help at Discharge: Family;Available 24 hours/day   Home Access: Stairs to enter   Entrance Stairs-Number of Steps: 2   Home Layout: One level Home Equipment: Agricultural consultant (2 wheels);Wheelchair - Lawyer Comments: Pt needs 3:1    Prior Function Prior Level of Function : Independent/Modified Independent             Mobility Comments: independent       Extremity/Trunk Assessment   Upper Extremity Assessment Upper Extremity  Assessment: Overall WFL for tasks assessed    Lower Extremity Assessment Lower Extremity Assessment: LLE deficits/detail LLE Deficits / Details: limited ankle AROM with incisions on medial aspect and edema and blister on foot, noted 4th and 5th digit with pallor and heel with dark area. able to extend knee fully and lift leg with some discomfort       Communication   Communication Communication: No apparent difficulties  Cognition Arousal: Alert Behavior During Therapy: WFL for tasks assessed/performed Overall Cognitive Status: Within Functional Limits for tasks assessed                                           General Comments General comments (skin integrity, edema, etc.): VSS on O2 @ 3LPM RN in the room to give meds    Exercises Other Exercises Other Exercises: encouraged ankle AROM   Assessment/Plan    PT Assessment Patient needs continued PT services  PT Problem List Pain;Decreased range of motion;Decreased mobility;Decreased activity tolerance;Cardiopulmonary status limiting activity;Decreased balance;Decreased strength       PT Treatment Interventions DME instruction;Functional mobility training;Balance training;Patient/family education;Gait training;Therapeutic exercise;Therapeutic activities    PT Goals (Current goals can be found in the Care Plan section)  Acute Rehab PT Goals Patient Stated Goal: to be able to walk again, no pain PT Goal Formulation: With patient Time For Goal Achievement: 08/28/23 Potential to Achieve Goals: Fair    Frequency Min 1X/week     Co-evaluation               AM-PAC PT "6 Clicks" Mobility  Outcome Measure Help needed turning from your back to your side while in a flat bed without using bedrails?: A Little Help needed moving from lying on your back to sitting on the side of a flat bed without using bedrails?: A Little Help needed moving to and from a bed to a chair (including a wheelchair)?: Total Help needed standing up from a chair using your arms (e.g., wheelchair or bedside chair)?: Total Help needed to walk in hospital room?: Total Help needed climbing 3-5 steps with a railing? : Total 6 Click Score: 10    End of Session   Activity Tolerance: Treatment limited secondary to medical complications (Comment) Patient left: in bed;with call bell/phone within reach   PT Visit Diagnosis: Other abnormalities of gait and mobility (R26.89);Difficulty in walking, not elsewhere classified (R26.2)    Time: 1610-9604 PT Time Calculation (min) (ACUTE ONLY): 20 min   Charges:   PT Evaluation $PT  Re-evaluation: 1 Re-eval   PT General Charges $$ ACUTE PT VISIT: 1 Visit         Sheran Lawless, PT Acute Rehabilitation Services Office:[phone] 08/14/2023   Tanner Phillips 08/14/2023, 4:08 PM

## 2023-08-15 ENCOUNTER — Inpatient Hospital Stay (HOSPITAL_COMMUNITY): Payer: Medicare Other

## 2023-08-15 DIAGNOSIS — I82412 Acute embolism and thrombosis of left femoral vein: Secondary | ICD-10-CM | POA: Diagnosis not present

## 2023-08-15 LAB — CBC
HCT: 36 % — ABNORMAL LOW (ref 39.0–52.0)
Hemoglobin: 11.8 g/dL — ABNORMAL LOW (ref 13.0–17.0)
MCH: 28 pg (ref 26.0–34.0)
MCHC: 32.8 g/dL (ref 30.0–36.0)
MCV: 85.5 fL (ref 80.0–100.0)
Platelets: 384 10*3/uL (ref 150–400)
RBC: 4.21 MIL/uL — ABNORMAL LOW (ref 4.22–5.81)
RDW: 13.2 % (ref 11.5–15.5)
WBC: 15.2 10*3/uL — ABNORMAL HIGH (ref 4.0–10.5)
nRBC: 0 % (ref 0.0–0.2)

## 2023-08-15 LAB — HEPARIN LEVEL (UNFRACTIONATED)
Heparin Unfractionated: 0.31 [IU]/mL (ref 0.30–0.70)
Heparin Unfractionated: <0.1 [IU]/mL — ABNORMAL LOW (ref 0.30–0.70)

## 2023-08-15 LAB — BRAIN NATRIURETIC PEPTIDE: B Natriuretic Peptide: 70.8 pg/mL (ref 0.0–100.0)

## 2023-08-15 MED ORDER — POLYETHYLENE GLYCOL 3350 17 G PO PACK
17.0000 g | PACK | Freq: Every day | ORAL | Status: DC
  Administered 2023-08-15 – 2023-08-19 (×2): 17 g via ORAL
  Filled 2023-08-15 (×4): qty 1

## 2023-08-15 MED ORDER — FUROSEMIDE 10 MG/ML IJ SOLN
40.0000 mg | Freq: Once | INTRAMUSCULAR | Status: AC
  Administered 2023-08-15: 40 mg via INTRAVENOUS
  Filled 2023-08-15: qty 4

## 2023-08-15 MED ORDER — ALPRAZOLAM 0.5 MG PO TABS
0.5000 mg | ORAL_TABLET | Freq: Three times a day (TID) | ORAL | Status: DC | PRN
  Administered 2023-08-15: 0.5 mg via ORAL
  Filled 2023-08-15: qty 1

## 2023-08-15 MED ORDER — ACETAMINOPHEN 500 MG PO TABS
1000.0000 mg | ORAL_TABLET | ORAL | Status: AC
  Administered 2023-08-16: 1000 mg via ORAL
  Filled 2023-08-15: qty 2

## 2023-08-15 MED ORDER — SENNA 8.6 MG PO TABS
1.0000 | ORAL_TABLET | Freq: Two times a day (BID) | ORAL | Status: DC
  Administered 2023-08-15 – 2023-08-19 (×8): 8.6 mg via ORAL
  Filled 2023-08-15 (×10): qty 1

## 2023-08-15 MED ORDER — HEPARIN (PORCINE) 25000 UT/250ML-% IV SOLN
2950.0000 [IU]/h | INTRAVENOUS | Status: DC
  Administered 2023-08-15: 2850 [IU]/h via INTRAVENOUS
  Administered 2023-08-16 (×2): 2950 [IU]/h via INTRAVENOUS
  Filled 2023-08-15 (×3): qty 250

## 2023-08-15 NOTE — Progress Notes (Signed)
PHARMACY - ANTICOAGULATION CONSULT NOTE  Pharmacy Consult for Heparin Indication:  acute LLE DVT  No Known Allergies  Patient Measurements: Height: 5\' 5"  (165.1 cm) Weight: 122.5 kg (270 lb) IBW/kg (Calculated) : 61.5 Heparin Dosing Weight: 90 kg  Vital Signs: Temp: 98.4 F (36.9 C) (11/06 0721) Temp Source: Oral (11/06 0721) BP: 141/60 (11/06 0721) Pulse Rate: 73 (11/06 0721)  Labs: Recent Labs    08/13/23 0436 08/14/23 0611 08/14/23 0758 08/14/23 1504 08/15/23 0432  HGB 13.6  --  12.9*  --  11.8*  HCT 42.0  --  40.5  --  36.0*  PLT 451*  --  426*  --  384  HEPARINUNFRC 0.44 <0.10*  --  0.37 0.31  CREATININE 1.37*  --  1.54*  --   --     Estimated Creatinine Clearance: 55 mL/min (A) (by C-G formula based on SCr of 1.54 mg/dL (H)).   Assessment: 67 YO male presenting 10/28 with a new LLE DVT nvolving the left femoral vein, popliteal vein as well as posterior tibial vein and peroneal vein.  There is also SVT involving the small saphenous vein as well as intramuscular thrombosis involving the left gastrocnemius vein and left soleus vein.  He also has ischemic changes on the left heel with ulceration as well as ischemic changes with ulceration between the fourth and fifth toes in the left foot--suggestive of underlying PAD.  Patient is s/p femoral to tibial bypass 11/4. Pharmacy consulted for heparin.  11/6 AM: Heparin level 0.31 is at low end of therapeutic on 2750 units/hr. Patient lost IV access overnight and heparin bag was dry for an unknown amount of time per RN. Heparin restarted at 1625. No bleeding or further issues with heparin infusion noted by RN. Slight Hgb drift down noted. Will increase slightly given worsening toe discoloration to target ~0.5.    Goal of Therapy:  Heparin level 0.3-0.7 units/ml Monitor platelets by anticoagulation protocol: Yes   Plan:  Increase heparin to 2850 units/hr  F/U heparin level in 8 hours  Daily heparin level and  CBC Follow-up transition back to Eliquis following procedures   Jani Gravel, PharmD Clinical Pharmacist  08/15/2023 7:47 AM

## 2023-08-15 NOTE — Progress Notes (Signed)
Physician advised Xanax TID PRN for anxiety.  Patient was provided with medication.  HR now 95 BPM. Will continue to offer support.

## 2023-08-15 NOTE — Plan of Care (Signed)
  Problem: Education: Goal: Knowledge of General Education information will improve Description: Including pain rating scale, medication(s)/side effects and non-pharmacologic comfort measures Outcome: Progressing   Problem: Health Behavior/Discharge Planning: Goal: Ability to manage health-related needs will improve Outcome: Progressing   Problem: Clinical Measurements: Goal: Ability to maintain clinical measurements within normal limits will improve Outcome: Progressing Goal: Will remain free from infection Outcome: Progressing   Problem: Activity: Goal: Risk for activity intolerance will decrease Outcome: Progressing   Problem: Nutrition: Goal: Adequate nutrition will be maintained Outcome: Progressing   Problem: Elimination: Goal: Will not experience complications related to bowel motility Outcome: Progressing   Problem: Pain Management: Goal: General experience of comfort will improve Outcome: Progressing   Problem: Safety: Goal: Ability to remain free from injury will improve Outcome: Progressing   Problem: Education: Goal: Understanding of CV disease, CV risk reduction, and recovery process will improve Outcome: Progressing   Problem: Activity: Goal: Ability to return to baseline activity level will improve Outcome: Progressing   Problem: Cardiovascular: Goal: Ability to achieve and maintain adequate cardiovascular perfusion will improve Outcome: Progressing Goal: Vascular access site(s) Level 0-1 will be maintained Outcome: Progressing

## 2023-08-15 NOTE — Progress Notes (Signed)
Physician informed about RR and HR.  RR trending 20-35.  HR trending 105-117.  Physician advised not to offer Xanax or pain medication.  Patient states that he isn't having pain but is anxious about procedure tomorrow.   Bright lights off in patient room - have advised wife to allow patient to rest/decrease stimulation.    08/15/23 1108  Vitals  Temp 98.4 F (36.9 C)  Temp Source Oral  BP 133/66  MAP (mmHg) 84  BP Location Right Arm  BP Method Automatic  Patient Position (if appropriate) Lying  Pulse Rate (!) 112  Pulse Rate Source Monitor  ECG Heart Rate (!) 117  Resp (!) 21  Level of Consciousness  Level of Consciousness Alert  MEWS COLOR  MEWS Score Color Yellow  Oxygen Therapy  SpO2 90 %  O2 Device Nasal Cannula  O2 Flow Rate (L/min) 2 L/min  MEWS Score  MEWS Temp 0  MEWS Systolic 0  MEWS Pulse 2  MEWS RR 1  MEWS LOC 0  MEWS Score 3

## 2023-08-15 NOTE — Plan of Care (Signed)
  Problem: Education: Goal: Knowledge of General Education information will improve Description: Including pain rating scale, medication(s)/side effects and non-pharmacologic comfort measures Outcome: Progressing   Problem: Activity: Goal: Risk for activity intolerance will decrease Outcome: Progressing   

## 2023-08-15 NOTE — Progress Notes (Addendum)
  Progress Note    08/15/2023 6:43 AM 2 Days Post-Op  Subjective:  no complaints  Tm 99.8 now afebrile  Vitals:   08/14/23 2254 08/15/23 0348  BP: 131/63 126/66  Pulse:  (!) 104  Resp:  (!) 24  Temp:  98.2 F (36.8 C)  SpO2:  92%    Physical Exam: General:  no distress; resting with leg elevated.   Cardiac:  regular Lungs:  non labored Incisions:  left groin with prevena with good seal; left leg incisions and left foot incision all look good.   Extremities:  palpable left DP pulse; continues with blister left foot; toes unchanged and malodorous.  Abdomen:  soft  CBC    Component Value Date/Time   WBC 15.2 (H) 08/15/2023 0432   RBC 4.21 (L) 08/15/2023 0432   HGB 11.8 (L) 08/15/2023 0432   HCT 36.0 (L) 08/15/2023 0432   PLT 384 08/15/2023 0432   MCV 85.5 08/15/2023 0432   MCH 28.0 08/15/2023 0432   MCHC 32.8 08/15/2023 0432   RDW 13.2 08/15/2023 0432   LYMPHSABS 1.2 08/06/2023 0927   MONOABS 1.3 (H) 08/06/2023 0927   EOSABS 0.1 08/06/2023 0927   BASOSABS 0.1 08/06/2023 0927    BMET    Component Value Date/Time   NA 137 08/14/2023 0758   K 3.6 08/14/2023 0758   CL 101 08/14/2023 0758   CO2 25 08/14/2023 0758   GLUCOSE 114 (H) 08/14/2023 0758   BUN 10 08/14/2023 0758   CREATININE 1.54 (H) 08/14/2023 0758   CALCIUM 8.3 (L) 08/14/2023 0758   GFRNONAA 49 (L) 08/14/2023 0758   GFRAA 52 (L) 05/21/2017 0010    INR    Component Value Date/Time   INR 1.6 (H) 02/16/2008 0525     Intake/Output Summary (Last 24 hours) at 08/15/2023 0643 Last data filed at 08/15/2023 0300 Gross per 24 hour  Intake 2666.63 ml  Output 2250 ml  Net 416.63 ml      Assessment/Plan:  67 y.o. male is s/p:   left common femoral to anterior tibial bypass at the ankle including thrombectomy of the anterior tibial for CLI with tissue loss.    2 Days Post-Op   -palpable left DP pulse -foot unchanged from yesterday afternoon.  Continue elevating leg for swelling.  Discussed with  pt and wife toes will need to demarcate and time will tell. Continue dry dressing between toes.  Prevena has good seal.   -leukocytosis with low grade fever last evening-continue IV abx per primary team -DVT prophylaxis:  heparin gtt   Doreatha Massed, PA-C [facility] [phone] 08/15/2023 6:43 AM  I have seen and evaluated the patient. I agree with the PA note as documented above.  Postop day 2 status post left common femoral to anterior tibial bypass for CLI with tissue loss.  Continues to have a palpable left DP pulse.  All of his incisions look good.  He has a Prevena in the groin.  Okay to continue heparin.  His left fourth and fifth toes are necrotic now and no longer viable.  I have recommended left fourth and fifth toe amputations tomorrow in the OR.  Please keep n.p.o. after midnight.  We will hold heparin on-call to the OR tomorrow morning.  Discussed with him and his wife.  Cephus Shelling, MD [facility] [place] Office: [phone]

## 2023-08-15 NOTE — Progress Notes (Signed)
PHARMACY - ANTICOAGULATION CONSULT NOTE  Pharmacy Consult for Heparin Indication:  acute LLE DVT  No Known Allergies  Patient Measurements: Height: 5\' 5"  (165.1 cm) Weight: 122.5 kg (270 lb) IBW/kg (Calculated) : 61.5 Heparin Dosing Weight: 90 kg  Vital Signs: Temp: 98.2 F (36.8 C) (11/06 0348) Temp Source: Oral (11/05 1956) BP: 126/66 (11/06 0348) Pulse Rate: 104 (11/06 0348)  Labs: Recent Labs    08/13/23 0436 08/14/23 0611 08/14/23 0758 08/14/23 1504 08/15/23 0432  HGB 13.6  --  12.9*  --  11.8*  HCT 42.0  --  40.5  --  36.0*  PLT 451*  --  426*  --  384  HEPARINUNFRC 0.44 <0.10*  --  0.37 0.31  CREATININE 1.37*  --  1.54*  --   --     Estimated Creatinine Clearance: 55 mL/min (A) (by C-G formula based on SCr of 1.54 mg/dL (H)).   Assessment: 68 YO male presenting 10/28 with a new LLE DVT nvolving the left femoral vein, popliteal vein as well as posterior tibial vein and peroneal vein.  There is also SVT involving the small saphenous vein as well as intramuscular thrombosis involving the left gastrocnemius vein and left soleus vein.  He also has ischemic changes on the left heel with ulceration as well as ischemic changes with ulceration between the fourth and fifth toes in the left foot--suggestive of underlying PAD.  Patient is s/p femoral to tibial bypass 11/4. Pharmacy consulted for heparin.   11/6 AM update:  Heparin level therapeutic x 2  Goal of Therapy:  Heparin level 0.3-0.7 units/ml Monitor platelets by anticoagulation protocol: Yes   Plan:  Cont heparin at 2750 units/hr  Daily heparin level and CBC Follow-up transition back to Eliquis following procedures   Abran Duke, PharmD, BCPS Clinical Pharmacist Phone: [phone]

## 2023-08-15 NOTE — Care Management Important Message (Signed)
Important Message  Patient Details  Name: Tanner Phillips MRN: [id number] Date of Birth: January 08, 1956   Important Message Given:  Yes - Medicare IM     Dorena Bodo 08/15/2023, 3:31 PM

## 2023-08-15 NOTE — Progress Notes (Addendum)
PHARMACY - ANTICOAGULATION CONSULT NOTE  Pharmacy Consult for Heparin Indication:  acute LLE DVT  No Known Allergies  Patient Measurements: Height: 5\' 5"  (165.1 cm) Weight: 122.5 kg (270 lb) IBW/kg (Calculated) : 61.5 Heparin Dosing Weight: 90 kg  Vital Signs: Temp: 97.5 F (36.4 C) (11/06 1625) Temp Source: Oral (11/06 1625) BP: 129/66 (11/06 1625) Pulse Rate: 99 (11/06 1625)  Labs: Recent Labs    08/13/23 0436 08/14/23 0611 08/14/23 0758 08/14/23 1504 08/15/23 0432 08/15/23 1834  HGB 13.6  --  12.9*  --  11.8*  --   HCT 42.0  --  40.5  --  36.0*  --   PLT 451*  --  426*  --  384  --   HEPARINUNFRC 0.44   < >  --  0.37 0.31 <0.10*  CREATININE 1.37*  --  1.54*  --   --   --    < > = values in this interval not displayed.    Estimated Creatinine Clearance: 55 mL/min (A) (by C-G formula based on SCr of 1.54 mg/dL (H)).   Assessment: 67 YO male presenting 10/28 with a new LLE DVT nvolving the left femoral vein, popliteal vein as well as posterior tibial vein and peroneal vein.  There is also SVT involving the small saphenous vein as well as intramuscular thrombosis involving the left gastrocnemius vein and left soleus vein.  He also has ischemic changes on the left heel with ulceration as well as ischemic changes with ulceration between the fourth and fifth toes in the left foot--suggestive of underlying PAD.  Patient is s/p femoral to tibial bypass 11/4. Pharmacy consulted for heparin.  Heparin level was delayed due to staffing and is at <0.1, which is is unexpectedly subtherapeutic on 2850 units/hr.  No issues with infusion or bleeding per RN.  Planning toe amputation 11/7.   Goal of Therapy:  Heparin level 0.3-0.7 units/ml Monitor platelets by anticoagulation protocol: Yes   Plan:  Increase heparin to 2950 units/hr   Daily heparin level and CBC Follow-up transition back to Eliquis following procedures  Alphia Moh, PharmD, BCPS, BCCP Clinical  Pharmacist  Please check AMION for all [facility] Pharmacy phone numbers After 10:00 PM, call Main Pharmacy [phone]

## 2023-08-15 NOTE — Progress Notes (Signed)
Occupational Therapy Treatment Patient Details Name: Tanner Phillips MRN: [id number] DOB: 03/29/1956 Today's Date: 08/15/2023   History of present illness Pt is a 67 yo male admitted with worsening LE cellulitis and found to have LLE DVT. S/p LLE angiogram 11/01. Underwent L common femoral to ant tib bypass with L greater sapenous vein harvest and thrombectomy of anterior tib artery on 08/13/23. PHM: ruptured disk in back, HTN, CAD s/p MI   OT comments  OT session focused on training in techniques for increased activity tolerance, safety, and independence with UB ADLs and bed mobility during/in preparation for functional tasks. Pt currently demonstrates ability to complete UB ADLs sitting EOB with Set up to Contact guard assist with increased time and bed mobility with Min assist, cues for technique, increased time, and increased effort. Pt declining attempted stand-pivot transfer to [facility] with L LE TTWB or NWB to L LE due to pain with pt unable to tolerate weight through L LE this day. Pt's O2 sat >/93% on 3L continuous O2 through nasal cannula. Pt's HR in the 90s at rest and elevating to 140 bpm during funcitonal activities sitting EOB. HR recovered to 90s once returned to supine. Pt will benefit from continued acute skilled rehab services to address deficits outlined below, decrease caregiver burden, and increase safety and independence with functional tasks. Post acute discharge, pt will benefit from intensive inpatient skilled rehab services < 3 hours per day to maximize rehab potential.       If plan is discharge home, recommend the following:  Assistance with cooking/housework;Assist for transportation;Help with stairs or ramp for entrance;A lot of help with walking and/or transfers;A lot of help with bathing/dressing/bathroom   Equipment Recommendations  Other (comment) (defer to next level of care)    Recommendations for Other Services      Precautions / Restrictions Precautions Precautions:  Fall Precaution Comments: wound vac L groin Restrictions Weight Bearing Restrictions: No LLE Weight Bearing: Weight bearing as tolerated       Mobility Bed Mobility Overal bed mobility: Needs Assistance Bed Mobility: Supine to Sit, Sit to Supine     Supine to sit: Min assist, HOB elevated, Used rails Sit to supine: Min assist   General bed mobility comments: Cues for technique, increased time, increased effort, assist for lines    Transfers Overall transfer level: Needs assistance                 General transfer comment: Pt declining attempted stand-pivot transfer to [facility] with L LE TTWB or NWB to L LE due to pain and pt unable to tolerate weight through L LE this day and     Balance Overall balance assessment: Needs assistance Sitting-balance support: Feet supported, Single extremity supported, No upper extremity supported Sitting balance-Leahy Scale: Good                                     ADL either performed or assessed with clinical judgement   ADL Overall ADL's : Needs assistance/impaired     Grooming: Set up;Supervision/safety;Sitting   Upper Body Bathing: Set up;Sitting       Upper Body Dressing : Contact guard assist;Sitting         Toilet Transfer Details (indicate cue type and reason): Pt unable to tolerate weight through L LE this day and declining attempted stand-pivot transfer to [facility] with L LE TTWB or NWB to L LE due  to pain.           General ADL Comments: Pt lmited due to pain in L LE. Pt also presenting with decreased activity tolerance and HR elevating to 140 bpm during functional activities sitting EOB.    Extremity/Trunk Assessment Upper Extremity Assessment Upper Extremity Assessment: Right hand dominant;Overall [facility] for tasks assessed   Lower Extremity Assessment Lower Extremity Assessment: Defer to PT evaluation LLE Deficits / Details: incisions on medial aspect and edema and blisters on dorsal and plantar sides  of foot, noted 4th and 5th digit with pallor and heel with dark area. Wound vac L groin        Vision       Perception     Praxis      Cognition Arousal: Alert Behavior During Therapy: WFL for tasks assessed/performed, Anxious (Largely WFL but with noted signs of anxiety with transitions from supine <-> sitting EOB) Overall Cognitive Status: Within Functional Limits for tasks assessed                                          Exercises      Shoulder Instructions       General Comments Pt's O2 sat >/93% on 3L continuous O2 through nasal cannula. Pt's HR in the 90s at rest and elevating to 140 bpm during funcitonal activities sitting EOB. HR recovered to 90s once returned to supine. Pt's wife present throughout session. Pt's wife stating multiple times that she would like to assist pt in getting to EOB and getting OOB. OT educated pt and wife on importance of having nrusing or therapy staff present with pt when transferring supine <-> sit and for all OOB transfers at this time for pt safety with both verbalizing understanding.    Pertinent Vitals/ Pain       Pain Assessment Pain Assessment: Faces Faces Pain Scale: Hurts whole lot Pain Location: L UE in foot and in thigh incision Pain Descriptors / Indicators: Grimacing, Guarding, Discomfort, Burning, Other (Comment), Operative site guarding (Pulling) Pain Intervention(s): Limited activity within patient's tolerance, Monitored during session, Repositioned, Patient requesting pain meds-RN notified, Relaxation (taking deep breaths sitting EOB)  Home Living                                          Prior Functioning/Environment              Frequency  Min 1X/week        Progress Toward Goals  OT Goals(current goals can now be found in the care plan section)  Progress towards OT goals: Progressing toward goals  Acute Rehab OT Goals Patient Stated Goal: to not have pain in L LE  Plan       Co-evaluation                 AM-PAC OT "6 Clicks" Daily Activity     Outcome Measure   Help from another person eating meals?: None Help from another person taking care of personal grooming?: A Little Help from another person toileting, which includes using toliet, bedpan, or urinal?: A Lot Help from another person bathing (including washing, rinsing, drying)?: A Lot Help from another person to put on and taking off regular upper body clothing?: A Little Help from another person  to put on and taking off regular lower body clothing?: A Lot 6 Click Score: 16    End of Session Equipment Utilized During Treatment: Oxygen  OT Visit Diagnosis: Other abnormalities of gait and mobility (R26.89);Other (comment);Pain (Decreased activity tolerance)   Activity Tolerance Patient limited by pain   Patient Left in bed;with call bell/phone within reach;with family/visitor present   Nurse Communication Mobility status;Weight bearing status;Other (comment);Patient requests pain meds (Battery changed in pt's telemetry box)        Time: 5409-8119 OT Time Calculation (min): 31 min  Charges: OT General Charges $OT Visit: 1 Visit OT Treatments $Self Care/Home Management : 8-22 mins $Therapeutic Activity: 8-22 mins  Erique Kaser "Orson Eva., OTR/L, MA Acute Rehab [phone]   Lendon Colonel 08/15/2023, 2:27 PM

## 2023-08-15 NOTE — TOC Progression Note (Signed)
Transition of Care [facility]) - Progression Note    Patient Details  Name: Tanner Phillips MRN: [id number] Date of Birth: [birth year]  Transition of Care [facility]) CM/SW Contact  Eduard Roux, [place] Phone Number: 08/15/2023, 3:12 PM  Clinical Narrative:     CSW met with patient and his spouse. CSW introduced self and explained role. CSW informed patient of recommendations for short term rehab at [facility].Marland Kitchen Patient & spouse declined SNF. Patient agreeable to [facility]. No preferred HH. Patient states no DME needs.  TOC will continue to follow and assist with discharge planning.  Antony Blackbird, MSW, LCSW Clinical Social Worker      Expected Discharge Plan: Home/Self Care Barriers to Discharge: No Barriers Identified  Expected Discharge Plan and Services In-house Referral: NA Discharge Planning Services:  (none) Post Acute Care Choice:  (none) Living arrangements for the past 2 months: Single Family Home                 DME Arranged:  (no recommendation)         HH Arranged:  (no recommendation)           Social Determinants of Health (SDOH) Interventions SDOH Screenings   Food Insecurity: No Food Insecurity (08/06/2023)  Housing: Patient Declined (08/06/2023)  Transportation Needs: No Transportation Needs (08/06/2023)  Utilities: Not At Risk (08/06/2023)  Tobacco Use: Low Risk  (08/13/2023)    Readmission Risk Interventions     No data to display

## 2023-08-15 NOTE — Progress Notes (Addendum)
PROGRESS NOTE  Tanner Phillips  ZOX:[id number] DOB: Aug 07, 1956 DOA: 08/06/2023 PCP: Ralene Ok, MD   Brief Narrative: Patient is a 67 year old male with history of peripheral artery disease, hypertension, coronary disease status/MI, morbid obesity who presented with left leg pain, ulcer on the left heel, blackish discoloration of left fourth and fifth toes. he was started on broad spectrum antibiotics for the cellulitis of left lower extremity.  Venous doppler of  LLE  also came out be positive, started on heparin.  ABIs on 10/30 showed severe bilateral lower extremity arterial disease.  Vascular surgery consulted.  Underwent arteriogram on 11/1, left common femoral to distal artery tibial bypass on 11/4.  PT/OT recommending SNF on discharge.  Vascular surgery planning for amputation of left fourth and fifth toes tomorrow  Assessment & Plan:  Principal Problem:   DVT, lower extremity (HCC) Active Problems:   Cellulitis   Radiculopathy   Essential hypertension   Renal insufficiency   PAD (peripheral artery disease) (HCC)   Bilateral critical limb ischemia: Found to have severe peripheral artery disease bilaterally.  Vascular surgery consulted and following. Underwent arteriogram on 11/1, left common femoral to distal artery tibial bypass on 11/4, left anterior tibial thrombectomy. Currently on heparin drip.  Continue aspirin  Acute left lower extremity DVT: Doppler study showed acute DVT involving the left femoral vein, popliteal vein, posterior tibial vein and peroneal vein.  Currently on heparin drip.  Left lower extremity cellulitis: Presented with ulcer on the left heel, fourth and fifth toe appear necrotic.  Leukocytosis persist.  He is afebrile.  Wound care following.  Currently on broad spectrum antibiotics: Ceftriaxone, Flagyl, linezolid.  Vascular surgery planning for amputation of left fourth and fifth toes tomorrow.  Will discuss with vascular  about duration/continuation of  antibiotic after the surgery.  Acute hypoxic respiratory failure/OSA: Does not use oxygen at home.  Desaturates mostly during .  Also looks volume overloaded with edema of the left lower extremity.  Currently on 2 L of oxygen.  Will check BNP.  Will give him a dose of Lasix IV 40 mg once.  Continue CPAP for OSA.  Checking a chest x-ray today  AKI on CKD stage IIIa: Currently kidney function at baseline.  Looks like his baseline creatinine is around 1.5  Hypertension: On Norvasc.  Monitor blood pressure  Hyperlipidemia: On Lipitor  Chronic back pain: Follows with Dr. Lovell Sheehan as an outpatient.  Gets history of his injection.  Continue supportive care, pain management  Morbid obesity: BMI 44.9  Deconditioning/debility: Patient seen by PT and OT and recommended SNF on discharge       DVT prophylaxis:SCDs Start: 08/06/23 1454     Code Status: Full Code  Family Communication: Discussed with wife at bedside on 11/6  Patient status: Inpatient  Patient is from : Home  Anticipated discharge to: SNF  Estimated DC date: After clearance from vascular surgery   Consultants: Vascular surgery  Procedures: Arteriogram, bypass surgery  Antimicrobials:  Anti-infectives (From admission, onward)    Start     Dose/Rate Route Frequency Ordered Stop   08/14/23 1215  linezolid (ZYVOX) IVPB 600 mg        600 mg 300 mL/hr over 60 Minutes Intravenous Every 12 hours 08/14/23 1123     08/12/23 1200  metroNIDAZOLE (FLAGYL) IVPB 500 mg        500 mg 100 mL/hr over 60 Minutes Intravenous Every 12 hours 08/12/23 1059     08/12/23 1200  cefTRIAXone (ROCEPHIN) 2 g  in sodium chloride 0.9 % 100 mL IVPB        2 g 200 mL/hr over 30 Minutes Intravenous Every 24 hours 08/12/23 1124     08/12/23 0715  ceFAZolin (ANCEF) IVPB 2g/100 mL premix       Note to Pharmacy: Send with pt to OR   2 g 200 mL/hr over 30 Minutes Intravenous To Short Stay 08/12/23 0708 08/13/23 0715   08/08/23 1000  cefadroxil  (DURICEF) capsule 500 mg        500 mg Oral 2 times daily 08/07/23 1852 08/10/23 2108   08/06/23 1600  ceFAZolin (ANCEF) IVPB 2g/100 mL premix        2 g 200 mL/hr over 30 Minutes Intravenous Every 8 hours 08/06/23 1455 08/07/23 1626   08/06/23 1445  vancomycin (VANCOREADY) IVPB 2000 mg/400 mL       Placed in "And" Linked Group   2,000 mg 200 mL/hr over 120 Minutes Intravenous  Once 08/06/23 1432 08/06/23 1822   08/06/23 1445  vancomycin (VANCOREADY) IVPB 500 mg/100 mL       Placed in "And" Linked Group   500 mg 100 mL/hr over 60 Minutes Intravenous  Once 08/06/23 1432 08/06/23 1723       Subjective: Patient seen and examined the today.  Hemodynamically stable.  On 2 L of oxygen.  Denies worsening shortness of breath or cough.  Wife was concerned about oxygen drop while asleep.  Using CPAP.  Has edema of the left lower extremity.  No abdomen pain, nausea or vomiting.  Objective: Vitals:   08/14/23 2253 08/14/23 2254 08/15/23 0348 08/15/23 0721  BP:  131/63 126/66 (!) 141/60  Pulse: 92  (!) 104 73  Resp:   (!) 24 (!) 21  Temp:   98.2 F (36.8 C) 98.4 F (36.9 C)  TempSrc:    Oral  SpO2:   92% 94%  Weight:      Height:        Intake/Output Summary (Last 24 hours) at 08/15/2023 0758 Last data filed at 08/15/2023 0300 Gross per 24 hour  Intake 2666.63 ml  Output 1350 ml  Net 1316.63 ml   Filed Weights   08/09/23 0325 08/10/23 1543 08/13/23 0716  Weight: 123 kg 126 kg 122.5 kg    Examination:  General exam: Overall comfortable, not in distress, obese HEENT: PERRL Respiratory system:  no wheezes or crackles , mild diminished on bilateral bases Cardiovascular system: Sinus tachycardia  gastrointestinal system: Abdomen is mildly distended, soft and nontender. Central nervous system: Alert and oriented Extremities: Edema of the left lower extremity, surgical wounds, bulla on the dorsum of left foot, black fourth and fifth left toes, blackish discoloration of left heel, no  clubbing ,no cyanosis Skin: No rashes, no ulcers,no icterus     Data Reviewed: I have personally reviewed following labs and imaging studies  CBC: Recent Labs  Lab 08/11/23 0344 08/12/23 0359 08/13/23 0436 08/14/23 0758 08/15/23 0432  WBC 11.0* 11.2* 12.5* 15.7* 15.2*  HGB 12.5* 12.9* 13.6 12.9* 11.8*  HCT 38.2* 39.4 42.0 40.5 36.0*  MCV 85.3 85.7 86.1 86.5 85.5  PLT 359 399 451* 426* 384   Basic Metabolic Panel: Recent Labs  Lab 08/09/23 1039 08/10/23 0558 08/12/23 0359 08/13/23 0436 08/14/23 0758  NA 139 139 138 138 137  K 3.7 3.8 3.7 3.8 3.6  CL 107 107 107 104 101  CO2 20* 23 23 24 25   GLUCOSE 91 102* 91 97 114*  BUN 13 12  12 13 10   CREATININE 1.37* 1.32* 1.33* 1.37* 1.54*  CALCIUM 8.1* 7.8* 8.3* 8.6* 8.3*     Recent Results (from the past 240 hour(s))  Surgical PCR screen     Status: None   Collection Time: 08/13/23  3:52 AM   Specimen: Nasal Mucosa; Nasal Swab  Result Value Ref Range Status   MRSA, PCR NEGATIVE NEGATIVE Final   Staphylococcus aureus NEGATIVE NEGATIVE Final    Comment: (NOTE) The Xpert SA Assay (FDA approved for NASAL specimens in patients 59 years of age and older), is one component of a comprehensive surveillance program. It is not intended to diagnose infection nor to guide or monitor treatment. Performed at [facility], [address] [place], [place] [address]      Radiology Studies: No results found.  Scheduled Meds:  amLODipine  10 mg Oral Daily   aspirin EC  81 mg Oral Daily   atorvastatin  40 mg Oral Daily   feeding supplement  237 mL Oral BID BM   gabapentin  300 mg Oral BID   sodium chloride flush  3 mL Intravenous Q12H   zolpidem  5 mg Oral QHS   Continuous Infusions:  cefTRIAXone (ROCEPHIN)  IV 2 g (08/14/23 2150)   heparin 2,750 Units/hr (08/15/23 0143)   lactated ringers 75 mL/hr at 08/15/23 0146   linezolid (ZYVOX) IV 600 mg (08/14/23 2142)   metronidazole 500 mg (08/14/23 2154)     LOS: 7 days    Burnadette Pop, MD [facility] P11/03/2023, 7:58 AM

## 2023-08-16 ENCOUNTER — Other Ambulatory Visit: Payer: Self-pay

## 2023-08-16 ENCOUNTER — Encounter (HOSPITAL_COMMUNITY): Payer: Self-pay | Admitting: Internal Medicine

## 2023-08-16 ENCOUNTER — Inpatient Hospital Stay (HOSPITAL_COMMUNITY): Payer: Medicare Other | Admitting: Anesthesiology

## 2023-08-16 ENCOUNTER — Encounter (HOSPITAL_COMMUNITY)
Admission: EM | Disposition: A | Discharge status: Home Health | Payer: Self-pay | Source: Home / Self Care | Attending: Internal Medicine

## 2023-08-16 DIAGNOSIS — I998 Other disorder of circulatory system: Secondary | ICD-10-CM | POA: Diagnosis not present

## 2023-08-16 DIAGNOSIS — I7025 Atherosclerosis of native arteries of other extremities with ulceration: Secondary | ICD-10-CM

## 2023-08-16 DIAGNOSIS — L97509 Non-pressure chronic ulcer of other part of unspecified foot with unspecified severity: Secondary | ICD-10-CM | POA: Diagnosis not present

## 2023-08-16 DIAGNOSIS — I96 Gangrene, not elsewhere classified: Secondary | ICD-10-CM | POA: Diagnosis not present

## 2023-08-16 DIAGNOSIS — Z9889 Other specified postprocedural states: Secondary | ICD-10-CM

## 2023-08-16 HISTORY — PX: AMPUTATION: SHX166

## 2023-08-16 LAB — HEPARIN LEVEL (UNFRACTIONATED)
Heparin Unfractionated: 0.39 [IU]/mL (ref 0.30–0.70)
Heparin Unfractionated: 0.17 [IU]/mL — ABNORMAL LOW (ref 0.30–0.70)

## 2023-08-16 LAB — BASIC METABOLIC PANEL
Anion gap: 8 (ref 5–15)
Anion gap: 9 (ref 5–15)
BUN: 20 mg/dL (ref 8–23)
BUN: 21 mg/dL (ref 8–23)
CO2: 25 mmol/L (ref 22–32)
CO2: 25 mmol/L (ref 22–32)
Calcium: 7.8 mg/dL — ABNORMAL LOW (ref 8.9–10.3)
Calcium: 7.8 mg/dL — ABNORMAL LOW (ref 8.9–10.3)
Chloride: 102 mmol/L (ref 98–111)
Chloride: 105 mmol/L (ref 98–111)
Creatinine, Ser: 1.64 mg/dL — ABNORMAL HIGH (ref 0.61–1.24)
Creatinine, Ser: 1.64 mg/dL — ABNORMAL HIGH (ref 0.61–1.24)
GFR, Estimated: 45 mL/min — ABNORMAL LOW (ref >60)
GFR, Estimated: 45 mL/min — ABNORMAL LOW (ref >60)
Glucose, Bld: 107 mg/dL — ABNORMAL HIGH (ref 70–99)
Glucose, Bld: 128 mg/dL — ABNORMAL HIGH (ref 70–99)
Potassium: 3.2 mmol/L — ABNORMAL LOW (ref 3.5–5.1)
Potassium: 3.3 mmol/L — ABNORMAL LOW (ref 3.5–5.1)
Sodium: 135 mmol/L (ref 135–145)
Sodium: 139 mmol/L (ref 135–145)

## 2023-08-16 LAB — CBC
HCT: 34.3 % — ABNORMAL LOW (ref 39.0–52.0)
Hemoglobin: 11.2 g/dL — ABNORMAL LOW (ref 13.0–17.0)
MCH: 28.1 pg (ref 26.0–34.0)
MCHC: 32.7 g/dL (ref 30.0–36.0)
MCV: 86 fL (ref 80.0–100.0)
Platelets: 378 10*3/uL (ref 150–400)
RBC: 3.99 MIL/uL — ABNORMAL LOW (ref 4.22–5.81)
RDW: 13.2 % (ref 11.5–15.5)
WBC: 14.9 10*3/uL — ABNORMAL HIGH (ref 4.0–10.5)
nRBC: 0 % (ref 0.0–0.2)

## 2023-08-16 LAB — PROTHROMBIN GENE MUTATION

## 2023-08-16 LAB — MAGNESIUM: Magnesium: 2 mg/dL (ref 1.7–2.4)

## 2023-08-16 SURGERY — AMPUTATION DIGIT
Anesthesia: General | Site: Toe | Laterality: Left

## 2023-08-16 MED ORDER — OXYCODONE HCL 5 MG PO TABS: 5.0000 mg | ORAL_TABLET | Freq: Once | ORAL | Status: DC | PRN

## 2023-08-16 MED ORDER — FENTANYL CITRATE (PF) 250 MCG/5ML IJ SOLN
INTRAMUSCULAR | Status: AC
  Filled 2023-08-16: qty 5

## 2023-08-16 MED ORDER — LIDOCAINE HCL (PF) 1 % IJ SOLN
INTRAMUSCULAR | Status: AC
  Filled 2023-08-16: qty 30

## 2023-08-16 MED ORDER — PHENYLEPHRINE 80 MCG/ML (10ML) SYRINGE FOR IV PUSH (FOR BLOOD PRESSURE SUPPORT)
PREFILLED_SYRINGE | INTRAVENOUS | Status: AC
  Filled 2023-08-16: qty 10

## 2023-08-16 MED ORDER — MIDAZOLAM HCL 2 MG/2ML IJ SOLN
INTRAMUSCULAR | Status: DC | PRN
  Administered 2023-08-16: 1 mg via INTRAVENOUS

## 2023-08-16 MED ORDER — POTASSIUM CHLORIDE 20 MEQ PO PACK
60.0000 meq | PACK | Freq: Once | ORAL | Status: AC
  Administered 2023-08-16: 60 meq via ORAL
  Filled 2023-08-16: qty 3

## 2023-08-16 MED ORDER — ORAL CARE MOUTH RINSE: 15.0000 mL | Freq: Once | OROMUCOSAL | Status: AC

## 2023-08-16 MED ORDER — PROPOFOL 10 MG/ML IV BOLUS
INTRAVENOUS | Status: DC | PRN
  Administered 2023-08-16: 200 mg via INTRAVENOUS

## 2023-08-16 MED ORDER — ACETAMINOPHEN 10 MG/ML IV SOLN: 1000.0000 mg | Freq: Once | INTRAVENOUS | Status: DC | PRN

## 2023-08-16 MED ORDER — ONDANSETRON HCL 4 MG/2ML IJ SOLN
INTRAMUSCULAR | Status: DC | PRN
  Administered 2023-08-16: 4 mg via INTRAVENOUS

## 2023-08-16 MED ORDER — EPHEDRINE 5 MG/ML INJ
INTRAVENOUS | Status: AC
  Filled 2023-08-16: qty 5

## 2023-08-16 MED ORDER — LACTATED RINGERS IV SOLN: INTRAVENOUS | Status: DC

## 2023-08-16 MED ORDER — FENTANYL CITRATE (PF) 100 MCG/2ML IJ SOLN: 25.0000 ug | INTRAMUSCULAR | Status: DC | PRN

## 2023-08-16 MED ORDER — OXYCODONE HCL 5 MG/5ML PO SOLN: 5.0000 mg | Freq: Once | ORAL | Status: DC | PRN

## 2023-08-16 MED ORDER — HEPARIN (PORCINE) 25000 UT/250ML-% IV SOLN
3100.0000 [IU]/h | INTRAVENOUS | Status: AC
  Administered 2023-08-16 (×2): 2950 [IU]/h via INTRAVENOUS
  Administered 2023-08-17: 3100 [IU]/h via INTRAVENOUS
  Filled 2023-08-16 (×2): qty 250

## 2023-08-16 MED ORDER — LIDOCAINE 2% (20 MG/ML) 5 ML SYRINGE
INTRAMUSCULAR | Status: DC | PRN
  Administered 2023-08-16: 100 mg via INTRAVENOUS

## 2023-08-16 MED ORDER — DROPERIDOL 2.5 MG/ML IJ SOLN: 0.6250 mg | Freq: Once | INTRAMUSCULAR | Status: DC | PRN

## 2023-08-16 MED ORDER — MIDAZOLAM HCL 2 MG/2ML IJ SOLN
INTRAMUSCULAR | Status: AC
  Filled 2023-08-16: qty 2

## 2023-08-16 MED ORDER — CHLORHEXIDINE GLUCONATE 0.12 % MT SOLN
15.0000 mL | Freq: Once | OROMUCOSAL | Status: AC
  Administered 2023-08-16: 15 mL via OROMUCOSAL
  Filled 2023-08-16: qty 15

## 2023-08-16 MED ORDER — EPHEDRINE SULFATE-NACL 50-0.9 MG/10ML-% IV SOSY
PREFILLED_SYRINGE | INTRAVENOUS | Status: DC | PRN
  Administered 2023-08-16: 5 mg via INTRAVENOUS
  Administered 2023-08-16: 10 mg via INTRAVENOUS

## 2023-08-16 MED ORDER — SUCCINYLCHOLINE CHLORIDE 200 MG/10ML IV SOSY
PREFILLED_SYRINGE | INTRAVENOUS | Status: AC
  Filled 2023-08-16: qty 10

## 2023-08-16 MED ORDER — LIDOCAINE 2% (20 MG/ML) 5 ML SYRINGE
INTRAMUSCULAR | Status: AC
  Filled 2023-08-16: qty 5

## 2023-08-16 MED ORDER — 0.9 % SODIUM CHLORIDE (POUR BTL) OPTIME
TOPICAL | Status: DC | PRN
  Administered 2023-08-16: 1000 mL

## 2023-08-16 MED ORDER — VASOPRESSIN 20 UNIT/ML IV SOLN
INTRAVENOUS | Status: DC | PRN
  Administered 2023-08-16: 1 [IU] via INTRAVENOUS
  Administered 2023-08-16: 2 [IU] via INTRAVENOUS
  Administered 2023-08-16 (×2): 1 [IU] via INTRAVENOUS

## 2023-08-16 MED ORDER — ONDANSETRON HCL 4 MG/2ML IJ SOLN
INTRAMUSCULAR | Status: AC
  Filled 2023-08-16: qty 2

## 2023-08-16 MED ORDER — SUCCINYLCHOLINE CHLORIDE 200 MG/10ML IV SOSY
PREFILLED_SYRINGE | INTRAVENOUS | Status: DC | PRN
  Administered 2023-08-16: 120 mg via INTRAVENOUS

## 2023-08-16 MED ORDER — FENTANYL CITRATE (PF) 250 MCG/5ML IJ SOLN
INTRAMUSCULAR | Status: DC | PRN
  Administered 2023-08-16: 50 ug via INTRAVENOUS
  Administered 2023-08-16: 100 ug via INTRAVENOUS

## 2023-08-16 SURGICAL SUPPLY — 31 items
BAG COUNTER SPONGE SURGICOUNT (BAG) ×1 IMPLANT
BAG SPNG CNTER NS LX DISP (BAG) ×1
BNDG ELASTIC 4X5.8 VLCR STR LF (GAUZE/BANDAGES/DRESSINGS) ×1 IMPLANT
BNDG GAUZE DERMACEA FLUFF 4 (GAUZE/BANDAGES/DRESSINGS) ×1 IMPLANT
BNDG GZE DERMACEA 4 6PLY (GAUZE/BANDAGES/DRESSINGS)
CANISTER SUCT 3000ML PPV (MISCELLANEOUS) ×1 IMPLANT
CANISTER WOUNDNEG PRESSURE 500 (CANNISTER) IMPLANT
CLIP TI MEDIUM 6 (CLIP) ×1 IMPLANT
COVER SURGICAL LIGHT HANDLE (MISCELLANEOUS) ×1 IMPLANT
DRAPE DERMATAC (DRAPES) IMPLANT
DRAPE EXTREMITY T 121X128X90 (DISPOSABLE) ×1 IMPLANT
DRAPE HALF SHEET 40X57 (DRAPES) ×1 IMPLANT
DRSG VAC GRANUFOAM MED (GAUZE/BANDAGES/DRESSINGS) IMPLANT
ELECT REM PT RETURN 9FT ADLT (ELECTROSURGICAL) ×1 IMPLANT
ELECTRODE REM PT RTRN 9FT ADLT (ELECTROSURGICAL) ×1 IMPLANT
GAUZE SPONGE 4X4 12PLY STRL (GAUZE/BANDAGES/DRESSINGS) ×1 IMPLANT
GLOVE BIOGEL PI IND STRL 7.0 (GLOVE) ×1 IMPLANT
GOWN STRL REUS W/ TWL LRG LVL3 (GOWN DISPOSABLE) ×2 IMPLANT
GOWN STRL REUS W/ TWL XL LVL3 (GOWN DISPOSABLE) ×1 IMPLANT
GOWN STRL REUS W/TWL LRG LVL3 (GOWN DISPOSABLE) ×2
GOWN STRL REUS W/TWL XL LVL3 (GOWN DISPOSABLE) ×1
KIT BASIN OR (CUSTOM PROCEDURE TRAY) ×1 IMPLANT
KIT TURNOVER KIT B (KITS) ×1 IMPLANT
NS IRRIG 1000ML POUR BTL (IV SOLUTION) ×1 IMPLANT
PACK GENERAL/GYN (CUSTOM PROCEDURE TRAY) ×1 IMPLANT
PAD ARMBOARD 7.5X6 YLW CONV (MISCELLANEOUS) ×2 IMPLANT
POWDER SURGICEL 3.0 GRAM (HEMOSTASIS) IMPLANT
SUT ETHILON 3 0 PS 1 (SUTURE) ×1 IMPLANT
TOWEL GREEN STERILE (TOWEL DISPOSABLE) ×2 IMPLANT
UNDERPAD 30X36 HEAVY ABSORB (UNDERPADS AND DIAPERS) ×1 IMPLANT
WATER STERILE IRR 1000ML POUR (IV SOLUTION) ×1 IMPLANT

## 2023-08-16 NOTE — Progress Notes (Signed)
PT Cancellation Note  Patient Details Name: ABANOUB HANKEN MRN: [id number] DOB: 1956-08-12   Cancelled Treatment:    Reason Eval/Treat Not Completed: Medical issues which prohibited therapy; noted pt for surgery today.  Will follow up after for mobility as able.   Elray Mcgregor 08/16/2023, 12:00 PM Sheran Lawless, PT Acute Rehabilitation Services Office:[phone] 08/16/2023

## 2023-08-16 NOTE — Progress Notes (Signed)
PHARMACY - ANTICOAGULATION CONSULT NOTE  Pharmacy Consult for Heparin Indication:  acute LLE DVT  No Known Allergies  Patient Measurements: Height: 5\' 5"  (165.1 cm) Weight: 122.5 kg (270 lb) IBW/kg (Calculated) : 61.5 Heparin Dosing Weight: 90 kg  Vital Signs: Temp: 97.9 F (36.6 C) (11/07 0315) Temp Source: Oral (11/07 0315) BP: 125/71 (11/06 2311) Pulse Rate: 95 (11/07 0222)  Labs: Recent Labs    08/14/23 0758 08/14/23 1504 08/15/23 0432 08/15/23 1834 08/15/23 2332 08/16/23 0344  HGB 12.9*  --  11.8*  --   --  11.2*  HCT 40.5  --  36.0*  --   --  34.3*  PLT 426*  --  384  --   --  378  HEPARINUNFRC  --    < > 0.31 <0.10*  --  0.39  CREATININE 1.54*  --   --   --  1.64* 1.64*   < > = values in this interval not displayed.    Estimated Creatinine Clearance: 51.7 mL/min (A) (by C-G formula based on SCr of 1.64 mg/dL (H)).   Assessment: 67 YO male presenting 10/28 with a new LLE DVT nvolving the left femoral vein, popliteal vein as well as posterior tibial vein and peroneal vein.  There is also SVT involving the small saphenous vein as well as intramuscular thrombosis involving the left gastrocnemius vein and left soleus vein.  He also has ischemic changes on the left heel with ulceration as well as ischemic changes with ulceration between the fourth and fifth toes in the left foot--suggestive of underlying PAD.  Patient is s/p femoral to tibial bypass 11/4. Pharmacy consulted for heparin.  Heparin level 0.39 on 2950 units/hr (therapeutic)  No issues with infusion or bleeding per RN/CBC stable.  Planning toe amputation 11/7.  Goal of Therapy:  Heparin level 0.3-0.7 units/ml Monitor platelets by anticoagulation protocol: Yes   Plan:  Continue heparin at 2950 units/hr   8h confirmatory level Daily heparin level and CBC Follow-up transition back to Eliquis following procedures  Arabella Merles, PharmD. Clinical Pharmacist 08/16/2023 5:10 AM

## 2023-08-16 NOTE — Progress Notes (Signed)
PROGRESS NOTE  DAO MEMMOTT  IHK:[id number] DOB: [birth year] DOA: 08/06/2023 PCP: Ralene Ok, MD (Inactive)   Brief Narrative: Patient is a 67 year old male with history of peripheral artery disease, hypertension, coronary disease status/MI, morbid obesity who presented with left leg pain, ulcer on the left heel, blackish discoloration of left fourth and fifth toes. he was started on broad spectrum antibiotics for the cellulitis of left lower extremity.  Venous doppler of  LLE  also came out be positive, started on heparin.  ABIs on 10/30 showed severe bilateral lower extremity arterial disease.  Vascular surgery consulted.  Underwent arteriogram on 11/1, left common femoral to distal artery tibial bypass on 11/4.  PT/OT recommending SNF on discharge.  Vascular surgery planning for amputation of left fourth and fifth toes later today.  Assessment & Plan:  Principal Problem:   DVT, lower extremity (HCC) Active Problems:   Cellulitis   Radiculopathy   Essential hypertension   Renal insufficiency   PAD (peripheral artery disease) (HCC)   Bilateral critical limb ischemia: Found to have severe peripheral artery disease bilaterally.  Vascular surgery consulted and following. Underwent arteriogram on 11/1, left common femoral to distal artery tibial bypass on 11/4, left anterior tibial thrombectomy. Currently on heparin drip.  Continue aspirin  Acute left lower extremity DVT: Doppler study showed acute DVT involving the left femoral vein, popliteal vein, posterior tibial vein and peroneal vein.  Currently on heparin drip.  Left lower extremity cellulitis: Presented with ulcer on the left heel, fourth and fifth toe appear necrotic.  Leukocytosis persist.  He is afebrile.  Wound care following.  Currently on broad spectrum antibiotics: Ceftriaxone, Flagyl, linezolid.  Vascular surgery planning for amputation of left fourth and fifth toes tomorrow.  Will discuss with vascular  about  duration/continuation of antibiotic after the surgery.  Acute hypoxic respiratory failure/OSA: Does not use oxygen at home.  Desaturates mostly during .  Also looks volume overloaded with edema of the left lower extremity.  Currently on 2 L of oxygen.  Will check BNP.  Will give him a dose of Lasix IV 40 mg once.  Continue CPAP for OSA.  Checking a chest x-ray today  AKI on CKD stage IIIa: Currently kidney function at baseline.  Looks like his baseline creatinine is around 1.5  Hypertension: On Norvasc.  Monitor blood pressure  Hyperlipidemia: On Lipitor  Chronic back pain: Follows with Dr. Lovell Sheehan as an outpatient.  Gets history of his injection.  Continue supportive care, pain management  Morbid obesity: BMI 44.9  Deconditioning/debility: Patient seen by PT and OT and recommended SNF on discharge       DVT prophylaxis:SCDs Start: 08/06/23 1454     Code Status: Full Code  Family Communication: Discussed with wife at bedside on 11/6  Patient status: Inpatient  Patient is from : Home  Anticipated discharge to: SNF  Estimated DC date: After clearance from vascular surgery   Consultants: Vascular surgery  Procedures: Arteriogram, bypass surgery  Antimicrobials:  Anti-infectives (From admission, onward)    Start     Dose/Rate Route Frequency Ordered Stop   08/14/23 1215  linezolid (ZYVOX) IVPB 600 mg        600 mg 300 mL/hr over 60 Minutes Intravenous Every 12 hours 08/14/23 1123     08/12/23 1200  metroNIDAZOLE (FLAGYL) IVPB 500 mg        500 mg 100 mL/hr over 60 Minutes Intravenous Every 12 hours 08/12/23 1059     08/12/23 1200  cefTRIAXone (ROCEPHIN)  2 g in sodium chloride 0.9 % 100 mL IVPB        2 g 200 mL/hr over 30 Minutes Intravenous Every 24 hours 08/12/23 1124     08/12/23 0715  ceFAZolin (ANCEF) IVPB 2g/100 mL premix       Note to Pharmacy: Send with pt to OR   2 g 200 mL/hr over 30 Minutes Intravenous To Short Stay 08/12/23 0708 08/13/23 0715   08/08/23  1000  cefadroxil (DURICEF) capsule 500 mg        500 mg Oral 2 times daily 08/07/23 1852 08/10/23 2108   08/06/23 1600  ceFAZolin (ANCEF) IVPB 2g/100 mL premix        2 g 200 mL/hr over 30 Minutes Intravenous Every 8 hours 08/06/23 1455 08/07/23 1626   08/06/23 1445  vancomycin (VANCOREADY) IVPB 2000 mg/400 mL       Placed in "And" Linked Group   2,000 mg 200 mL/hr over 120 Minutes Intravenous  Once 08/06/23 1432 08/06/23 1822   08/06/23 1445  vancomycin (VANCOREADY) IVPB 500 mg/100 mL       Placed in "And" Linked Group   500 mg 100 mL/hr over 60 Minutes Intravenous  Once 08/06/23 1432 08/06/23 1723       Subjective: Patient seen and examined the today.  Hemodynamically stable.  On 2 L of oxygen.  Denies worsening shortness of breath or cough.  Wife was concerned about oxygen drop while asleep.  Using CPAP.  Has edema of the left lower extremity.  No abdomen pain, nausea or vomiting.  Objective: Vitals:   08/16/23 1445 08/16/23 1500 08/16/23 1515 08/16/23 1630  BP: 112/66 125/69 129/83   Pulse: 97 (!) 107 (!) 104   Resp: 16 17 20 20   Temp:   98.3 F (36.8 C)   TempSrc:      SpO2: 94% 95% 95%   Weight:      Height:        Intake/Output Summary (Last 24 hours) at 08/16/2023 1723 Last data filed at 08/16/2023 1423 Gross per 24 hour  Intake 1762.2 ml  Output 905 ml  Net 857.2 ml   Filed Weights   08/10/23 1543 08/13/23 0716 08/16/23 1236  Weight: 126 kg 122.5 kg 120.7 kg    Examination:  General exam: Overall comfortable, not in distress, obese HEENT: PERRL Respiratory system:  no wheezes or crackles , mild diminished on bilateral bases Cardiovascular system: Sinus tachycardia  gastrointestinal system: Abdomen is mildly distended, soft and nontender. Central nervous system: Alert and oriented Extremities: Edema of the left lower extremity, surgical wounds, bulla on the dorsum of left foot, black fourth and fifth left toes, blackish discoloration of left heel, no  clubbing ,no cyanosis Skin: No rashes, no ulcers,no icterus     Data Reviewed: I have personally reviewed following labs and imaging studies  CBC: Recent Labs  Lab 08/12/23 0359 08/13/23 0436 08/14/23 0758 08/15/23 0432 08/16/23 0344  WBC 11.2* 12.5* 15.7* 15.2* 14.9*  HGB 12.9* 13.6 12.9* 11.8* 11.2*  HCT 39.4 42.0 40.5 36.0* 34.3*  MCV 85.7 86.1 86.5 85.5 86.0  PLT 399 451* 426* 384 378   Basic Metabolic Panel: Recent Labs  Lab 08/12/23 0359 08/13/23 0436 08/14/23 0758 08/15/23 2332 08/16/23 0344 08/16/23 1108  NA 138 138 137 135 139  --   K 3.7 3.8 3.6 3.2* 3.3*  --   CL 107 104 101 102 105  --   CO2 23 24 25 25 25   --  GLUCOSE 91 97 114* 128* 107*  --   BUN 12 13 10 21 20   --   CREATININE 1.33* 1.37* 1.54* 1.64* 1.64*  --   CALCIUM 8.3* 8.6* 8.3* 7.8* 7.8*  --   MG  --   --   --   --   --  2.0     Recent Results (from the past 240 hour(s))  Surgical PCR screen     Status: None   Collection Time: 08/13/23  3:52 AM   Specimen: Nasal Mucosa; Nasal Swab  Result Value Ref Range Status   MRSA, PCR NEGATIVE NEGATIVE Final   Staphylococcus aureus NEGATIVE NEGATIVE Final    Comment: (NOTE) The Xpert SA Assay (FDA approved for NASAL specimens in patients 69 years of age and older), is one component of a comprehensive surveillance program. It is not intended to diagnose infection nor to guide or monitor treatment. Performed at [facility], [address] [place], [place] [address]      Radiology Studies: DG CHEST PORT 1 VIEW  Result Date: 08/15/2023 CLINICAL DATA:  Hypoxia. EXAM: PORTABLE CHEST 1 VIEW COMPARISON:  06/16/2020 FINDINGS: Poor inspiration with a grossly stable normal sized heart. Interval bibasilar linear density. The remainder of the lungs are clear with normal vascularity. Proximal left humerus fixation hardware. IMPRESSION: Poor inspiration with bibasilar subsegmental atelectasis. Electronically Signed   By: Beckie Salts M.D.   On:  08/15/2023 14:19    Scheduled Meds:  amLODipine  10 mg Oral Daily   aspirin EC  81 mg Oral Daily   atorvastatin  40 mg Oral Daily   feeding supplement  237 mL Oral BID BM   gabapentin  300 mg Oral BID   polyethylene glycol  17 g Oral Daily   senna  1 tablet Oral BID   sodium chloride flush  3 mL Intravenous Q12H   zolpidem  5 mg Oral QHS   Continuous Infusions:  cefTRIAXone (ROCEPHIN)  IV 2 g (08/15/23 2150)   heparin 2,950 Units/hr (08/16/23 1637)   linezolid (ZYVOX) IV 600 mg (08/15/23 2149)   metronidazole 500 mg (08/16/23 0924)     LOS: 8 days   Fran Lowes, MD [facility] P11/04/2023, 5:23 PM

## 2023-08-16 NOTE — Progress Notes (Signed)
CCC Pre-op Review  Pre-op checklist:   NPO:  yes; LAST LIQUID 0930  Labs:     @ 0344  K 3.3*   Cr 1.64* / BUN 20   INR 1.6  Consent:   informed  H&P:    needs update  Vitals:   12 noon 97.7 p 78 r 19  123/66   Sat 92 on 2lnc  O2 requirements:   2lnc  MAR/PTA review:  0917 KlorCon  IV:   18g LFA   22g LFA  Floor nurse name:   Melony Overly RN [id number]   Additional info:    IV Rocephin q24                             Flagyl IV q12h                      Heparin  gtt *  stopped @ 1216    ----> HTN  /  CAD, MI 62yrs ago

## 2023-08-16 NOTE — Plan of Care (Signed)
  Problem: Education: Goal: Knowledge of General Education information will improve Description: Including pain rating scale, medication(s)/side effects and non-pharmacologic comfort measures Outcome: Progressing   Problem: Health Behavior/Discharge Planning: Goal: Ability to manage health-related needs will improve Outcome: Progressing   Problem: Clinical Measurements: Goal: Ability to maintain clinical measurements within normal limits will improve Outcome: Progressing Goal: Cardiovascular complication will be avoided Outcome: Progressing   Problem: Activity: Goal: Risk for activity intolerance will decrease Outcome: Progressing   Problem: Coping: Goal: Level of anxiety will decrease Outcome: Progressing   Problem: Elimination: Goal: Will not experience complications related to bowel motility Outcome: Progressing   Problem: Pain Management: Goal: General experience of comfort will improve Outcome: Progressing   Problem: Safety: Goal: Ability to remain free from injury will improve Outcome: Progressing   Problem: Skin Integrity: Goal: Risk for impaired skin integrity will decrease Outcome: Progressing   Problem: Activity: Goal: Ability to return to baseline activity level will improve Outcome: Progressing   Problem: Cardiovascular: Goal: Ability to achieve and maintain adequate cardiovascular perfusion will improve Outcome: Progressing

## 2023-08-16 NOTE — Anesthesia Preprocedure Evaluation (Addendum)
Anesthesia Evaluation  Patient identified by MRN, date of birth, ID band Patient awake    Reviewed: Allergy & Precautions, H&P , NPO status , Patient's Chart, lab work & pertinent test results  Airway Mallampati: II  TM Distance: >3 FB Neck ROM: Full    Dental no notable dental hx.    Pulmonary neg pulmonary ROS   Pulmonary exam normal breath sounds clear to auscultation       Cardiovascular hypertension, + Past MI and + Peripheral Vascular Disease  Normal cardiovascular exam Rhythm:Regular Rate:Normal     Neuro/Psych negative neurological ROS  negative psych ROS   GI/Hepatic negative GI ROS, Neg liver ROS,,,  Endo/Other  negative endocrine ROS    Renal/GU Renal disease  negative genitourinary   Musculoskeletal negative musculoskeletal ROS (+)    Abdominal   Peds negative pediatric ROS (+)  Hematology negative hematology ROS (+)   Anesthesia Other Findings   Reproductive/Obstetrics negative OB ROS                             Anesthesia Physical Anesthesia Plan  ASA: 3  Anesthesia Plan: General   Post-op Pain Management:    Induction: Intravenous  PONV Risk Score and Plan: Ondansetron and Dexamethasone  Airway Management Planned: LMA  Additional Equipment:   Intra-op Plan:   Post-operative Plan: Extubation in OR  Informed Consent: I have reviewed the patients History and Physical, chart, labs and discussed the procedure including the risks, benefits and alternatives for the proposed anesthesia with the patient or authorized representative who has indicated his/her understanding and acceptance.     Dental advisory given  Plan Discussed with: CRNA  Anesthesia Plan Comments:        Anesthesia Quick Evaluation

## 2023-08-16 NOTE — Anesthesia Procedure Notes (Addendum)
Procedure Name: Intubation Date/Time: 08/16/2023 1:36 PM  Performed by: Ayesha Rumpf, CRNAPre-anesthesia Checklist: Patient identified, Emergency Drugs available, Suction available and Patient being monitored Patient Re-evaluated:Patient Re-evaluated prior to induction Oxygen Delivery Method: Circle System Utilized Preoxygenation: Pre-oxygenation with 100% oxygen Induction Type: IV induction Ventilation: Mask ventilation without difficulty and Oral airway inserted - appropriate to patient size Laryngoscope Size: Mac and 4 Grade View: Grade III Tube type: Oral Tube size: 7.5 mm Number of attempts: 1 Airway Equipment and Method: Stylet and Oral airway Placement Confirmation: ETT inserted through vocal cords under direct vision, positive ETCO2 and breath sounds checked- equal and bilateral Secured at: 24 cm Tube secured with: Tape Dental Injury: Teeth and Oropharynx as per pre-operative assessment

## 2023-08-16 NOTE — Progress Notes (Addendum)
  Progress Note    08/16/2023 7:28 AM 3 Days Post-Op  Subjective:  anxious about surgery today. No complaints    Vitals:   08/16/23 0222 08/16/23 0315  BP:  (!) 123/58  Pulse: 95 (!) 45  Resp: 17 (!) 23  Temp:  97.9 F (36.6 C)  SpO2: 94% 94%   Physical Exam: Cardiac:  regular Lungs:  non labored Incisions:  Left groin incision with Prevena vac with good seal, left leg incisions are all intact and well appearing Extremities:  Doppler left DP; Left foot with ischemic heel, large bulla on dorsum of foot unchanged, and gangrene of 4th and 5th toes Abdomen:  obese, soft Neurologic: alert and oriented  CBC    Component Value Date/Time   WBC 14.9 (H) 08/16/2023 0344   RBC 3.99 (L) 08/16/2023 0344   HGB 11.2 (L) 08/16/2023 0344   HCT 34.3 (L) 08/16/2023 0344   PLT 378 08/16/2023 0344   MCV 86.0 08/16/2023 0344   MCH 28.1 08/16/2023 0344   MCHC 32.7 08/16/2023 0344   RDW 13.2 08/16/2023 0344   LYMPHSABS 1.2 08/06/2023 0927   MONOABS 1.3 (H) 08/06/2023 0927   EOSABS 0.1 08/06/2023 0927   BASOSABS 0.1 08/06/2023 0927    BMET    Component Value Date/Time   NA 139 08/16/2023 0344   K 3.3 (L) 08/16/2023 0344   CL 105 08/16/2023 0344   CO2 25 08/16/2023 0344   GLUCOSE 107 (H) 08/16/2023 0344   BUN 20 08/16/2023 0344   CREATININE 1.64 (H) 08/16/2023 0344   CALCIUM 7.8 (L) 08/16/2023 0344   GFRNONAA 45 (L) 08/16/2023 0344   GFRAA 52 (L) 05/21/2017 0010    INR    Component Value Date/Time   INR 1.6 (H) 02/16/2008 0525     Intake/Output Summary (Last 24 hours) at 08/16/2023 0960 Last data filed at 08/16/2023 0325 Gross per 24 hour  Intake 1932.2 ml  Output 1700 ml  Net 232.2 ml     Assessment/Plan:  67 y.o. male is s/p left common femoral to anterior tibial bypass at the ankle including thrombectomy of the anterior tibial for CLI with tissue loss  3 Days Post-Op   Left leg remains well perfused with doppler DP signal Incisions are all intact and well  appearing, continue Prevena VAC Scheduled to go to OR today with Dr. Hetty Blend for 4th and 5th toe amputations Continue Abx Continue NPO Consent ordered Hold heparin on way to OR   Graceann Congress, PA-C [facility] [phone] 08/16/2023 7:28 AM  I have seen and evaluated the patient. I agree with the PA note as documented above.  Postop day 3 status post left common femoral to anterior tibial bypass.  Continues to have a palpable DP pulse in the left foot.  All of his incisions look great.  His toes have demarcated and scheduled for left fourth and fifth toe amputations today.  Please hold heparin on-call to the OR.  Cephus Shelling, MD [facility] Office: [phone]     Plan for left 4th and 5th toe amputations with possible vac placement  Daria Pastures MD [facility] Phone Number: [phone] 08/16/2023 12:51 PM

## 2023-08-16 NOTE — Progress Notes (Signed)
PHARMACY - ANTICOAGULATION CONSULT NOTE  Pharmacy Consult for Heparin Indication:  acute LLE DVT  No Known Allergies  Patient Measurements: Height: 5\' 5"  (165.1 cm) Weight: 120.7 kg (266 lb) IBW/kg (Calculated) : 61.5 Heparin Dosing Weight: 90 kg  Vital Signs: Temp: 98.3 F (36.8 C) (11/07 1515) Temp Source: Oral (11/07 1200) BP: 129/83 (11/07 1515) Pulse Rate: 104 (11/07 1515)  Labs: Recent Labs    08/14/23 0758 08/14/23 1504 08/15/23 0432 08/15/23 1834 08/15/23 2332 08/16/23 0344  HGB 12.9*  --  11.8*  --   --  11.2*  HCT 40.5  --  36.0*  --   --  34.3*  PLT 426*  --  384  --   --  378  HEPARINUNFRC  --    < > 0.31 <0.10*  --  0.39  CREATININE 1.54*  --   --   --  1.64* 1.64*   < > = values in this interval not displayed.    Estimated Creatinine Clearance: 51.2 mL/min (A) (by C-G formula based on SCr of 1.64 mg/dL (H)).   Assessment: 67 YO male presenting 10/28 with a new LLE DVT nvolving the left femoral vein, popliteal vein as well as posterior tibial vein and peroneal vein.  There is also SVT involving the small saphenous vein as well as intramuscular thrombosis involving the left gastrocnemius vein and left soleus vein.  He also has ischemic changes on the left heel with ulceration as well as ischemic changes with ulceration between the fourth and fifth toes in the left foot--suggestive of underlying PAD.  Patient is s/p femoral to tibial bypass 11/4. Pharmacy consulted for heparin.  S/p toe amputation. Restart Heparin at 1630 per consult. Heparin level was therapeutic at 0.39 on 2950 units/hr,   Goal of Therapy:  Heparin level 0.3-0.7 units/ml Monitor platelets by anticoagulation protocol: Yes   Plan:  Restart heparin at 2950 units/hr at 16:30 F/u 8h heparin level Daily heparin level and CBC Follow-up transition back to Eliquis following procedures  Alphia Moh, PharmD, BCPS, BCCP Clinical Pharmacist  Please check AMION for all [facility] phone  numbers After 10:00 PM, call Main Pharmacy [phone]

## 2023-08-16 NOTE — Op Note (Signed)
    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 male with a history of PAD and CL TI with gangrenous fourth and fifth left toes who underwent left common femoral to anterior tibial bypass with nonreversed GSV on Monday with Dr. Chestine Spore.  The left fourth and fifth toes continue to demarcate and he was offered left fourth and fifth toe amputations.  Risk benefits were reviewed and he was willing to proceed.  FINDINGS:   Necrotic, painful soft tissue extending up to the proximal forefoot Marginal muscle of the midfoot, minimal bleeding.  TECHNIQUE:   The patient was brought to the operating room and kept on the stretcher.  Anesthesia was induced and antibiotics were already administered while in preop.  The left foot was prepped and draped in usual sterile fashion and timeout was performed.  An elliptical incision around the base of the fourth fifth toe was was made and this was carried deeper with sharp dissection to the level of the MTP joint.  Should be noted that there was no bleeding from the soft tissue at this level.  The toes were removed sharply at the metatarsophalangeal joint and removed from the field.  It was obvious that forefoot proximal to these toes need to be debrided.  The incision was extended proximally and the soft tissue overlying the fourth and fifth metatarsals was removed.  A periosteal elevator was used to circumferentially dissect the soft tissue off of both fourth and fifth metatarsals.  A bone cutter was then used to transect metatarsals at the edge of the wound and a rongeur was used to debride the sharp edges.  The capsules of the fourth and fifth  MCP joints were then removed and the wound was copiously irrigated.  It should be noted that there was minimal bleeding of the skin edges with thrombosed capillaries and the subcutaneous tissues were pale.  Wound VAC was applied and the foot was then wrapped in an Ace wrap.  The patient tolerated the procedure well, all counts were correct at the end of the case and he was transported to PACU in stable condition.  Daria Pastures, MD [facility] DATE OF DICTATION:   08/16/2023

## 2023-08-16 NOTE — Transfer of Care (Signed)
Immediate Anesthesia Transfer of Care Note  Patient: DEANGLO HISSONG  Procedure(s) Performed: LEFT FOURTH AND FIFTH TOE AMPUTATIONS WITH PLACEMENT OF WOUND VAC (Left: Toe)  Patient Location: PACU  Anesthesia Type:General  Level of Consciousness: awake, drowsy, patient cooperative, and responds to stimulation  Airway & Oxygen Therapy: Patient Spontanous Breathing and Patient connected to face mask oxygen  Post-op Assessment: Report given to RN and Post -op Vital signs reviewed and stable  Post vital signs: Reviewed and stable  Last Vitals:  Vitals Value Taken Time  BP 104/63 08/16/23 1432  Temp    Pulse 97 08/16/23 1435  Resp 20 08/16/23 1435  SpO2 90 % 08/16/23 1435  Vitals shown include unfiled device data.  Last Pain:  Vitals:   08/16/23 1200  TempSrc: Oral  PainSc:       Patients Stated Pain Goal: 0 (08/14/23 0900)  Complications: No notable events documented.

## 2023-08-16 NOTE — Anesthesia Procedure Notes (Addendum)
Procedure Name: LMA Insertion Date/Time: 08/16/2023 1:34 PM  Performed by: Ayesha Rumpf, CRNAPre-anesthesia Checklist: Patient identified, Emergency Drugs available, Suction available and Patient being monitored Patient Re-evaluated:Patient Re-evaluated prior to induction Oxygen Delivery Method: Circle System Utilized Preoxygenation: Pre-oxygenation with 100% oxygen Induction Type: IV induction Ventilation: Mask ventilation without difficulty LMA: LMA inserted LMA Size: 5.0 Number of attempts: 1 Airway Equipment and Method: Bite block Placement Confirmation: positive ETCO2 Tube secured with: Tape Dental Injury: Teeth and Oropharynx as per pre-operative assessment  Comments: LMA with significant leak despite troubleshooting measures to achieve successful placement.  LMA removed to facilitate OETT intubation.

## 2023-08-17 ENCOUNTER — Inpatient Hospital Stay (HOSPITAL_COMMUNITY): Payer: Medicare Other

## 2023-08-17 ENCOUNTER — Encounter (HOSPITAL_COMMUNITY): Payer: Self-pay | Admitting: Vascular Surgery

## 2023-08-17 DIAGNOSIS — I739 Peripheral vascular disease, unspecified: Secondary | ICD-10-CM

## 2023-08-17 DIAGNOSIS — L03116 Cellulitis of left lower limb: Secondary | ICD-10-CM | POA: Diagnosis not present

## 2023-08-17 DIAGNOSIS — I82412 Acute embolism and thrombosis of left femoral vein: Secondary | ICD-10-CM | POA: Diagnosis not present

## 2023-08-17 LAB — CBC
HCT: 35 % — ABNORMAL LOW (ref 39.0–52.0)
Hemoglobin: 11.4 g/dL — ABNORMAL LOW (ref 13.0–17.0)
MCH: 27.9 pg (ref 26.0–34.0)
MCHC: 32.6 g/dL (ref 30.0–36.0)
MCV: 85.8 fL (ref 80.0–100.0)
Platelets: 394 10*3/uL (ref 150–400)
RBC: 4.08 MIL/uL — ABNORMAL LOW (ref 4.22–5.81)
RDW: 13.4 % (ref 11.5–15.5)
WBC: 13.6 10*3/uL — ABNORMAL HIGH (ref 4.0–10.5)
nRBC: 0 % (ref 0.0–0.2)

## 2023-08-17 LAB — BASIC METABOLIC PANEL
Anion gap: 8 (ref 5–15)
BUN: 17 mg/dL (ref 8–23)
CO2: 27 mmol/L (ref 22–32)
Calcium: 7.3 mg/dL — ABNORMAL LOW (ref 8.9–10.3)
Chloride: 103 mmol/L (ref 98–111)
Creatinine, Ser: 1.45 mg/dL — ABNORMAL HIGH (ref 0.61–1.24)
GFR, Estimated: 52 mL/min — ABNORMAL LOW (ref >60)
Glucose, Bld: 137 mg/dL — ABNORMAL HIGH (ref 70–99)
Potassium: 3.5 mmol/L (ref 3.5–5.1)
Sodium: 138 mmol/L (ref 135–145)

## 2023-08-17 LAB — FACTOR 5 LEIDEN

## 2023-08-17 LAB — HEPARIN LEVEL (UNFRACTIONATED): Heparin Unfractionated: 0.29 [IU]/mL — ABNORMAL LOW (ref 0.30–0.70)

## 2023-08-17 MED ORDER — ENOXAPARIN SODIUM 120 MG/0.8ML IJ SOSY
120.0000 mg | PREFILLED_SYRINGE | Freq: Two times a day (BID) | INTRAMUSCULAR | Status: DC
  Administered 2023-08-17 – 2023-08-20 (×8): 120 mg via SUBCUTANEOUS
  Filled 2023-08-17 (×9): qty 0.8

## 2023-08-17 NOTE — Evaluation (Signed)
Occupational Therapy Re-Evaluation Patient Details Name: Tanner Phillips MRN: [id number] DOB: 1956-03-09 Today's Date: 08/17/2023   History of Present Illness Pt is a 67 yo male admitted with worsening LE cellulitis and found to have LLE DVT. S/p LLE angiogram 11/01. Underwent L common femoral to ant tib bypass with L greater sapenous vein harvest and thrombectomy of anterior tib artery on 08/13/23. 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  PMH: ruptured disk in back, HTN, CAD s/p MI   Clinical Impression   At baseline, pt is Independent with ADLs, IADLs, and functional mobility without an AD. Patient now post L 4th and 5th toe amputations with wound vac. Pt now presents with decreased activity tolerance, decreased balance during functional tasks, pain affecting functional level, and decreased safety and independence with functional tasks. Pt currently demonstrates ability to complete UB ADLs with Set up to Contact guard assist and LB ADLs with Mod to Max assist. Pt participated well in session but was limited by L LE pain. Pt's goals updated this day. Pt will benefit from continued acute skilled OT services to address deficits outlined below, decrease caregiver burden, and increase safety and independence with functional tasks. Post acute discharge, OT recommends intensive inpatient skilled rehab services < 3 hours per day to maximize rehab potential. However, pt declining intensive inpatient rehab at this time. Due to this, pt will benefit from continued skilled OT services in the home. Pt will also benefit from a 3-in-1 for use in the home.       If plan is discharge home, recommend the following: Assistance with cooking/housework;Assist for transportation;Help with stairs or ramp for entrance;A lot of help with bathing/dressing/bathroom;A little help with walking and/or transfers    Functional Status Assessment  Patient has had a  recent decline in their functional status and demonstrates the ability to make significant improvements in function in a reasonable and predictable amount of time.  Equipment Recommendations  BSC/3in1    Recommendations for Other Services       Precautions / Restrictions Precautions Precautions: Fall Precaution Comments: wound vac L groin & L foot Restrictions Weight Bearing Restrictions: No LLE Weight Bearing: Weight bearing as tolerated      Mobility Bed Mobility Overal bed mobility: Needs Assistance Bed Mobility: Supine to Sit, Sit to Supine     Supine to sit: Min assist, HOB elevated, Used rails Sit to supine: Contact guard assist (assist with lines; no physical assist needed)   General bed mobility comments: HHA to lift trunk upright    Transfers Overall transfer level: Needs assistance                 General transfer comment: Pt declined funcitonal transfers this session due to L LE pain. Pt performed sit<-> stand and step-pivot transfers with PT earlier this day with Min assist.      Balance Overall balance assessment: Needs assistance Sitting-balance support: Single extremity supported, No upper extremity supported, Feet supported Sitting balance-Leahy Scale: Good                                     ADL either performed or assessed with clinical judgement   ADL Overall ADL's : Needs assistance/impaired Eating/Feeding: Set up;Sitting   Grooming: Set up;Supervision/safety;Sitting   Upper Body Bathing: Contact guard assist;Sitting Upper Body Bathing Details (indicate cue type and reason): simulated  Lower Body Bathing: Moderate assistance;Sitting/lateral leans;Maximal assistance Lower Body Bathing Details (indicate cue type and reason): simulated Upper Body Dressing : Contact guard assist;Sitting   Lower Body Dressing: Maximal assistance;Sitting/lateral leans     Toilet Transfer Details (indicate cue type and reason): deferred this  session secondary to L LE pain Toileting- Clothing Manipulation and Hygiene: Moderate assistance;Maximal assistance;Sitting/lateral lean Toileting - Clothing Manipulation Details (indicate cue type and reason): simulated       General ADL Comments: Pt declined funcitonal transfers this session due to L LE pain.     Vision Baseline Vision/History: 0 No visual deficits Ability to See in Adequate Light: 0 Adequate Patient Visual Report: No change from baseline       Perception         Praxis         Pertinent Vitals/Pain Pain Assessment Pain Assessment: Faces Faces Pain Scale: Hurts whole lot Pain Location: L foot with dependency and weight bearing Pain Descriptors / Indicators: Grimacing, Guarding, Discomfort, Burning, Other (Comment), Operative site guarding Pain Intervention(s): Limited activity within patient's tolerance, Monitored during session, Repositioned, Patient requesting pain meds-RN notified, RN gave pain meds during session     Extremity/Trunk Assessment Upper Extremity Assessment Upper Extremity Assessment: Right hand dominant;Overall WFL for tasks assessed   Lower Extremity Assessment Lower Extremity Assessment: Defer to PT evaluation LLE Deficits / Details: foot with wound vac       Communication Communication Communication: No apparent difficulties   Cognition Arousal: Alert Behavior During Therapy: WFL for tasks assessed/performed, Anxious Overall Cognitive Status: Within Functional Limits for tasks assessed                                 General Comments: Mildly anxious in sitting due to L LE pain; AAOx4 and pleasant throughout session     General Comments  Pt's HR into low 110s with activity this session. Pt on RA upon OT arrival with O2 sat at 91%. O2 sat down to 88% on RA with bed mobility with O2 sat recovering to >/90% with seated rest. Pt left on RA with O2 nasal cannula within reach and RN present at end of session. Pt's wife  present throughout session. RN and nursing student present during a portion of session.    Exercises     Shoulder Instructions      Home Living Family/patient expects to be discharged to:: Private residence Living Arrangements: Spouse/significant other Available Help at Discharge: Family;Available 24 hours/day Type of Home: House Home Access: Stairs to enter [employer] of Steps: 2   Home Layout: One level     Bathroom Shower/Tub: Walk-in shower;Door   [employer] Toilet: Standard Bathroom Accessibility: Yes How Accessible: Accessible via walker Home Equipment: Agricultural consultant (2 wheels);Wheelchair - manual;Shower seat          Prior Functioning/Environment Prior Level of Function : Independent/Modified Independent             Mobility Comments: independent ADLs Comments: independent        OT Problem List: Decreased activity tolerance;Impaired balance (sitting and/or standing);Decreased knowledge of use of DME or AE;Pain      OT Treatment/Interventions: Self-care/ADL training;Energy conservation;DME and/or AE instruction;Therapeutic activities;Patient/family education;Balance training    OT Goals(Current goals can be found in the care plan section) Acute Rehab OT Goals Patient Stated Goal: to return home and have less pain in L LE OT Goal Formulation: With patient/family Time For  Goal Achievement: 08/31/23 Potential to Achieve Goals: Good ADL Goals Pt Will Perform Grooming: with supervision;standing Pt Will Perform Lower Body Bathing: with contact guard assist;sitting/lateral leans;sit to/from stand Pt Will Perform Lower Body Dressing: with contact guard assist;sitting/lateral leans;sit to/from stand Pt Will Transfer to Toilet: with supervision;ambulating;bedside commode (with RW) Pt Will Perform Toileting - Clothing Manipulation and hygiene: sitting/lateral leans;sit to/from stand;with supervision Pt Will Perform Tub/Shower Transfer: Shower  transfer;with supervision;ambulating;shower seat;rolling walker  OT Frequency: Min 1X/week    Co-evaluation              AM-PAC OT "6 Clicks" Daily Activity     Outcome Measure Help from another person eating meals?: None Help from another person taking care of personal grooming?: A Little Help from another person toileting, which includes using toliet, bedpan, or urinal?: A Lot Help from another person bathing (including washing, rinsing, drying)?: A Lot Help from another person to put on and taking off regular upper body clothing?: A Little Help from another person to put on and taking off regular lower body clothing?: A Lot 6 Click Score: 16   End of Session Equipment Utilized During Treatment: Oxygen Nurse Communication: Mobility status;Patient requests pain meds;Other (comment) (Pt participated well.)  Activity Tolerance: Patient limited by pain Patient left: in bed;with call bell/phone within reach;with nursing/sitter in room;with family/visitor present  OT Visit Diagnosis: Other abnormalities of gait and mobility (R26.89);Other (comment);Pain (decreased activity tolerance)                Time: 4098-1191 OT Time Calculation (min): 23 min Charges:  OT General Charges $OT Visit: 1 Visit OT Evaluation $OT Re-eval: 1 Re-eval  Ciria Bernardini "Orson Eva., OTR/L, MA Acute Rehab [phone]   Lendon Colonel 08/17/2023, 3:25 PM

## 2023-08-17 NOTE — Progress Notes (Signed)
PHARMACY - ANTICOAGULATION CONSULT NOTE  Pharmacy Consult for Heparin Indication:  acute LLE DVT  No Known Allergies  Patient Measurements: Height: 5\' 5"  (165.1 cm) Weight: 120.7 kg (266 lb) IBW/kg (Calculated) : 61.5 Heparin Dosing Weight: 90 kg  Vital Signs: Temp: 97.9 F (36.6 C) (11/08 0350) Temp Source: Oral (11/08 0350) BP: 137/63 (11/08 0307) Pulse Rate: 104 (11/08 0307)  Labs: Recent Labs    08/15/23 0432 08/15/23 1834 08/15/23 2332 08/16/23 0344 08/16/23 2007 08/17/23 0424  HGB 11.8*  --   --  11.2*  --  11.4*  HCT 36.0*  --   --  34.3*  --  35.0*  PLT 384  --   --  378  --  394  HEPARINUNFRC 0.31   < >  --  0.39 0.17* 0.29*  CREATININE  --   --  1.64* 1.64*  --   --    < > = values in this interval not displayed.    Estimated Creatinine Clearance: 51.2 mL/min (A) (by C-G formula based on SCr of 1.64 mg/dL (H)).   Assessment: 67 YO male presenting 10/28 with a new LLE DVT nvolving the left femoral vein, popliteal vein as well as posterior tibial vein and peroneal vein.  There is also SVT involving the small saphenous vein as well as intramuscular thrombosis involving the left gastrocnemius vein and left soleus vein.  He also has ischemic changes on the left heel with ulceration as well as ischemic changes with ulceration between the fourth and fifth toes in the left foot--suggestive of underlying PAD.  Patient is s/p femoral to tibial bypass 11/4. Pharmacy consulted for heparin.  11/8 AM update: Rn reports steady bleeding from patients PIV on heparin 3100 units/hour. IV still functional, but patient has limited access. Some concern patient has some resistance to IV heparin based on high rate and continued subtherapeutic levels despite titration. After discussion with [facility] PA, OK to transition to lovenox therapeutic dosing until patient is able to transition over to apixaban.   Goal of Therapy:  Heparin level 0.3-0.7 units/ml Monitor platelets by anticoagulation  protocol: Yes   Plan:  STOP heparin  START lovenox 120 mg Lowry Q12H- administer first dose at the same time heparin infusion is stopped  Follow-up transition back to Eliquis  Jani Gravel, PharmD Clinical Pharmacist  08/17/2023 12:24 PM

## 2023-08-17 NOTE — Anesthesia Postprocedure Evaluation (Signed)
Anesthesia Post Note  Patient: Tanner Phillips  Procedure(s) Performed: LEFT FOURTH AND FIFTH TOE AMPUTATIONS WITH PLACEMENT OF WOUND VAC (Left: Toe)     Patient location during evaluation: PACU Anesthesia Type: General Level of consciousness: awake and alert Pain management: pain level controlled Vital Signs Assessment: post-procedure vital signs reviewed and stable Respiratory status: spontaneous breathing, nonlabored ventilation, respiratory function stable and patient connected to nasal cannula oxygen Cardiovascular status: blood pressure returned to baseline and stable Postop Assessment: no apparent nausea or vomiting Anesthetic complications: no   No notable events documented.            [place]

## 2023-08-17 NOTE — Plan of Care (Signed)
  Problem: Education: Goal: Knowledge of General Education information will improve Description: Including pain rating scale, medication(s)/side effects and non-pharmacologic comfort measures Outcome: Progressing   Problem: Health Behavior/Discharge Planning: Goal: Ability to manage health-related needs will improve Outcome: Progressing   Problem: Clinical Measurements: Goal: Ability to maintain clinical measurements within normal limits will improve Outcome: Progressing Goal: Cardiovascular complication will be avoided Outcome: Progressing   Problem: Activity: Goal: Risk for activity intolerance will decrease Outcome: Progressing   Problem: Nutrition: Goal: Adequate nutrition will be maintained Outcome: Progressing   Problem: Coping: Goal: Level of anxiety will decrease Outcome: Progressing   Problem: Elimination: Goal: Will not experience complications related to bowel motility Outcome: Progressing   Problem: Pain Management: Goal: General experience of comfort will improve Outcome: Progressing   Problem: Safety: Goal: Ability to remain free from injury will improve Outcome: Progressing

## 2023-08-17 NOTE — Progress Notes (Signed)
VAC with new "blockage" alarm.  Dr Hetty Blend to come see Pt.

## 2023-08-17 NOTE — Progress Notes (Signed)
PHARMACY - ANTICOAGULATION CONSULT NOTE  Pharmacy Consult for Heparin Indication:  acute LLE DVT  No Known Allergies  Patient Measurements: Height: 5\' 5"  (165.1 cm) Weight: 120.7 kg (266 lb) IBW/kg (Calculated) : 61.5 Heparin Dosing Weight: 90 kg  Vital Signs: Temp: 97.9 F (36.6 C) (11/08 0350) Temp Source: Oral (11/08 0350) BP: 137/63 (11/08 0307) Pulse Rate: 104 (11/08 0307)  Labs: Recent Labs    08/14/23 0758 08/14/23 1504 08/15/23 0432 08/15/23 1834 08/15/23 2332 08/16/23 0344 08/16/23 2007 08/17/23 0424  HGB 12.9*  --  11.8*  --   --  11.2*  --  11.4*  HCT 40.5  --  36.0*  --   --  34.3*  --  35.0*  PLT 426*  --  384  --   --  378  --  394  HEPARINUNFRC  --    < > 0.31   < >  --  0.39 0.17* 0.29*  CREATININE 1.54*  --   --   --  1.64* 1.64*  --   --    < > = values in this interval not displayed.    Estimated Creatinine Clearance: 51.2 mL/min (A) (by C-G formula based on SCr of 1.64 mg/dL (H)).   Assessment: 67 YO male presenting 10/28 with a new LLE DVT nvolving the left femoral vein, popliteal vein as well as posterior tibial vein and peroneal vein.  There is also SVT involving the small saphenous vein as well as intramuscular thrombosis involving the left gastrocnemius vein and left soleus vein.  He also has ischemic changes on the left heel with ulceration as well as ischemic changes with ulceration between the fourth and fifth toes in the left foot--suggestive of underlying PAD.  Patient is s/p femoral to tibial bypass 11/4. Pharmacy consulted for heparin.  11/8: heparin level slightly subtherapeutic (0.29) after restart s/p toe amputation on 2950 units/hr. Per RN, no issues with heparin running continuously or signs/symptoms of bleeding. CBC shows Hgb 11s and plts 394.  Goal of Therapy:  Heparin level 0.3-0.7 units/ml Monitor platelets by anticoagulation protocol: Yes   Plan:  Increase heparin to 3100 units/hr  F/u 8h heparin level Daily heparin  level and CBC Follow-up transition back to Eliquis following procedures  Arabella Merles, PharmD. Clinical Pharmacist 08/17/2023 5:38 AM

## 2023-08-17 NOTE — TOC Progression Note (Signed)
Transition of Care (TOC) - Progression Note  Donn Pierini RN, BSN Transitions of Care Unit 4E- RN Case Manager See Treatment Team for direct phone #   Patient Details  Name: Tanner Phillips MRN: [id number] Date of Birth: 1956-02-08  Transition of Care [facility]) CM/SW Contact  Zenda Alpers, Lenn Sink, RN Phone Number: 08/17/2023, 3:37 PM  Clinical Narrative:    Per CSW pt has declined SNF and prefers to return home w/ HH- no preference for [facility] provider- CM has reached out to [facility] liaison to see if they can accept referral for HHRN/PT/OT needs.  Per Clarise Cruz- [facility] has accepted referral.   Pt s/p OR yesterday with wound VAC placed on L-foot- CM confirmed with VSS this am that pt will need home VAC and VAC drsg changes on discharge- [facility] order form has been signed by [facility]SS and CM faxed to [facility] liaison to start approval process for home VAC. Pt also has prevena VAC in L-groin.   CM spoke with pt and wife at bedside to update on transition plans. Confirmed pt desire to return home. Discussed HH choice- and arrangements with [facility] pt and wife voiced they are agreeable with referral to [facility].  Per pt and wife pt has RW, BSC, and wheelchair at home working on ramp for home.  Pt states that he will have friends on discharge to assist with getting him home and into the house. Plan will be for wife to transport home.   Anticipate pt will be here through the weekend with another VAC drsg change planned for Monday.   [facility] following for [facility] needs- and aware pt will need VAC drsg on return home.   Home Wound VAC- pending approval.    Expected Discharge Plan: Home w Home Health Services Barriers to Discharge: Continued Medical Work up  Expected Discharge Plan and Services In-house Referral: Clinical Social Work Discharge Planning Services: CM Consult Post Acute Care Choice: Durable Medical Equipment, Home Health Living arrangements for the past 2 months: Single Family Home                  DME Arranged: Vac DME Agency: [facility] Date DME Agency Contacted: 08/17/23 Time DME Agency Contacted: 1100 Representative spoke with at DME Agency: [place] HH Arranged: RN, PT, OT [facility] Agency: [facility] ([facility]) Date HH Agency Contacted: 08/16/23 Time HH Agency Contacted: 1230 Representative spoke with at [facility] Agency: Clarise Cruz   Social Determinants of Health (SDOH) Interventions SDOH Screenings   Food Insecurity: No Food Insecurity (08/06/2023)  Housing: Patient Declined (08/06/2023)  Transportation Needs: No Transportation Needs (08/06/2023)  Utilities: Not At Risk (08/06/2023)  Tobacco Use: Low Risk  (08/16/2023)    Readmission Risk Interventions     No data to display

## 2023-08-17 NOTE — Progress Notes (Addendum)
  Progress Note    08/17/2023 7:54 AM 1 Day Post-Op  Subjective:  pain well controlled   Vitals:   08/17/23 0307 08/17/23 0350  BP: 137/63   Pulse: (!) 104   Resp: 20   Temp:  97.9 F (36.6 C)  SpO2: 97%    Physical Exam: Lungs:  non labored Incisions:  L groin with vac in place; vein harvest incisions are well appearing; L foot vac with good seal Extremities:  palpable DP pulse Neurologic: A&O  CBC    Component Value Date/Time   WBC 13.6 (H) 08/17/2023 0424   RBC 4.08 (L) 08/17/2023 0424   HGB 11.4 (L) 08/17/2023 0424   HCT 35.0 (L) 08/17/2023 0424   PLT 394 08/17/2023 0424   MCV 85.8 08/17/2023 0424   MCH 27.9 08/17/2023 0424   MCHC 32.6 08/17/2023 0424   RDW 13.4 08/17/2023 0424   LYMPHSABS 1.2 08/06/2023 0927   MONOABS 1.3 (H) 08/06/2023 0927   EOSABS 0.1 08/06/2023 0927   BASOSABS 0.1 08/06/2023 0927    BMET    Component Value Date/Time   NA 139 08/16/2023 0344   K 3.3 (L) 08/16/2023 0344   CL 105 08/16/2023 0344   CO2 25 08/16/2023 0344   GLUCOSE 107 (H) 08/16/2023 0344   BUN 20 08/16/2023 0344   CREATININE 1.64 (H) 08/16/2023 0344   CALCIUM 7.8 (L) 08/16/2023 0344   GFRNONAA 45 (L) 08/16/2023 0344   GFRAA 52 (L) 05/21/2017 0010    INR    Component Value Date/Time   INR 1.6 (H) 02/16/2008 0525     Intake/Output Summary (Last 24 hours) at 08/17/2023 0754 Last data filed at 08/17/2023 0602 Gross per 24 hour  Intake 1639.84 ml  Output 905 ml  Net 734.84 ml     Assessment/Plan:  67 y.o. male is s/p L femoral to ATA bypass with vein and subsequent 4,5 toe amps 1 Day Post-Op   L foot well perfused with palpable DP pulse Groin and foot vac with good seal PT/OT today; ok to bear weight L foot Ok to d/c IV abx from vascular standpoint Continue heparin for now   Emilie Rutter, PA-C [facility] [phone] 08/17/2023 7:54 AM  VASCULAR STAFF ADDENDUM: I have independently interviewed and examined the patient. I  agree with the above.  Palpable DP pulse Good seal on foot wound vac. Plan for vac change on Monday.  Daria Pastures MD [facility] Phone Number: [phone] 08/17/2023 2:27 PM

## 2023-08-17 NOTE — Progress Notes (Signed)
PROGRESS NOTE  Tanner Phillips  WUJ:[id number] DOB: 04-21-1956 DOA: 08/06/2023 PCP: Ralene Ok, MD (Inactive)   Brief Narrative: Patient is a 67 year old male with history of peripheral artery disease, hypertension, coronary disease status/MI, morbid obesity who presented with left leg pain, ulcer on the left heel, blackish discoloration of left fourth and fifth toes. he was started on broad spectrum antibiotics for the cellulitis of left lower extremity.  Venous doppler of  LLE  also came out be positive, started on heparin.  ABIs on 10/30 showed severe bilateral lower extremity arterial disease.  Vascular surgery consulted.  Underwent arteriogram on 11/1, left common femoral to distal artery tibial bypass on 11/4.  PT/OT recommending SNF on discharge.    The patient underwent amputation of the 4th and 5th toes of the left foot following Left femoral to ATA bypass with vein. Wound vac is in place.  He has tolerated the procedure well. Vascular surgery has a plan to change wound vac on Monday.  Assessment & Plan:  Principal Problem:   DVT, lower extremity (HCC) Active Problems:   Cellulitis   Radiculopathy   Essential hypertension   Renal insufficiency   PAD (peripheral artery disease) (HCC)   Bilateral critical limb ischemia: Found to have severe peripheral artery disease bilaterally.  Vascular surgery consulted and following. Underwent arteriogram on 11/1, left common femoral to distal artery tibial bypass on 11/4, left anterior tibial thrombectomy. Currently on heparin drip. Per vascular surgery the patient is to remain on heparin for another 24 hours. Vascular surgery has also stated that it is okay to discontinue antibiotics. Continue aspirin.  Acute left lower extremity DVT: Doppler study showed acute DVT involving the left femoral vein, popliteal vein, posterior tibial vein and peroneal vein.  Continue heparin drip for another 24 hours.  Left lower extremity cellulitis: Presented  with ulcer on the left heel, fourth and fifth toe appear necrotic.  Leukocytosis persist.  He is afebrile.  Wound care following.  Currently on broad spectrum antibiotics: Ceftriaxone, Flagyl, linezolid.  Vascular surgery planning for amputation of left fourth and fifth toes tomorrow.  Per vascular surgery okay to discontinue antibiotics. WBC is slowly declining. Blood cultures were never taken prior to surgery.  Acute hypoxic respiratory failure/OSA: Does not use oxygen at home.  Desaturates mostly during .  Also looks volume overloaded with edema of the left lower extremity.  Currently on 2 L of oxygen with variable oxygen saturations.  The patient has incentive spirometry. Will check CXR.  AKI on CKD stage IIIa: Currently kidney function at baseline.  Looks like his baseline creatinine is around 1.5. Creatinine is 1.45 today.  Hypertension: On Norvasc.  Monitor blood pressure  Hyperlipidemia: On Lipitor  Chronic back pain: Follows with Dr. Lovell Sheehan as an outpatient.  Gets history of his injection.  Continue supportive care, pain management.  Morbid obesity: BMI 44.9  Deconditioning/debility: Patient seen by PT and OT and recommended SNF on discharge       DVT prophylaxis:SCDs Start: 08/06/23 1454     Code Status: Full Code  Family Communication: Discussed with wife at bedside on 11/8.  Patient status: Inpatient  Patient is from : Home  Anticipated discharge to: SNF  Estimated DC date: After clearance from vascular surgery   Consultants: Vascular surgery  Procedures: Arteriogram, bypass surgery  Antimicrobials:  Anti-infectives (From admission, onward)    Start     Dose/Rate Route Frequency Ordered Stop   08/14/23 1215  linezolid (ZYVOX) IVPB 600 mg  600 mg 300 mL/hr over 60 Minutes Intravenous Every 12 hours 08/14/23 1123 08/17/23 2359   08/12/23 1200  metroNIDAZOLE (FLAGYL) IVPB 500 mg        500 mg 100 mL/hr over 60 Minutes Intravenous Every 12 hours 08/12/23  1059 08/17/23 2359   08/12/23 1200  cefTRIAXone (ROCEPHIN) 2 g in sodium chloride 0.9 % 100 mL IVPB        2 g 200 mL/hr over 30 Minutes Intravenous Every 24 hours 08/12/23 1124 08/17/23 2359   08/12/23 0715  ceFAZolin (ANCEF) IVPB 2g/100 mL premix       Note to Pharmacy: Send with pt to OR   2 g 200 mL/hr over 30 Minutes Intravenous To Short Stay 08/12/23 0708 08/13/23 0715   08/08/23 1000  cefadroxil (DURICEF) capsule 500 mg        500 mg Oral 2 times daily 08/07/23 1852 08/10/23 2108   08/06/23 1600  ceFAZolin (ANCEF) IVPB 2g/100 mL premix        2 g 200 mL/hr over 30 Minutes Intravenous Every 8 hours 08/06/23 1455 08/07/23 1626   08/06/23 1445  vancomycin (VANCOREADY) IVPB 2000 mg/400 mL       Placed in "And" Linked Group   2,000 mg 200 mL/hr over 120 Minutes Intravenous  Once 08/06/23 1432 08/06/23 1822   08/06/23 1445  vancomycin (VANCOREADY) IVPB 500 mg/100 mL       Placed in "And" Linked Group   500 mg 100 mL/hr over 60 Minutes Intravenous  Once 08/06/23 1432 08/06/23 1723       Subjective: Patient seen and examined the today.  Hemodynamically stable.  On 2 L of oxygen. The patient is resting comfortably. No new complaints.  Objective: Vitals:   08/17/23 0307 08/17/23 0350 08/17/23 0810 08/17/23 1452  BP: 137/63  116/66 (!) 140/67  Pulse: (!) 104  99 (!) 104  Resp: 20  20 (!) 26  Temp:  97.9 F (36.6 C) 98.7 F (37.1 C) 98.2 F (36.8 C)  TempSrc:  Oral Oral Oral  SpO2: 97%  98% (!) 89%  Weight:      Height:        Intake/Output Summary (Last 24 hours) at 08/17/2023 1507 Last data filed at 08/17/2023 0811 Gross per 24 hour  Intake 1289.84 ml  Output 900 ml  Net 389.84 ml   Filed Weights   08/10/23 1543 08/13/23 0716 08/16/23 1236  Weight: 126 kg 122.5 kg 120.7 kg    Examination:  Exam:  Constitutional:  The patient is awake, alert, and oriented x 3. No acute distress. Respiratory:  No increased work of breathing. No wheezes, rales, or rhonchi No  tactile fremitus Cardiovascular:  Regular rate and rhythm No murmurs, ectopy, or gallups. No lateral PMI. No thrills. Abdomen:  Abdomen is soft, non-tender, non-distended No hernias, masses, or organomegaly Normoactive bowel sounds.  Musculoskeletal:  No cyanosis, clubbing, or edema Left foot and lower extremity is bandaged. Skin:  No rashes, lesions, ulcers palpation of skin: no induration or nodules Neurologic:  CN 2-12 intact Sensation all 4 extremities intact Psychiatric:  Mental status Mood, affect appropriate Orientation to person, place, time  judgment and insight appear intact      Data Reviewed: I have personally reviewed following labs and imaging studies  CBC: Recent Labs  Lab 08/13/23 0436 08/14/23 0758 08/15/23 0432 08/16/23 0344 08/17/23 0424  WBC 12.5* 15.7* 15.2* 14.9* 13.6*  HGB 13.6 12.9* 11.8* 11.2* 11.4*  HCT 42.0 40.5 36.0* 34.3* 35.0*  MCV 86.1 86.5 85.5 86.0 85.8  PLT 451* 426* 384 378 394   Basic Metabolic Panel: Recent Labs  Lab 08/13/23 0436 08/14/23 0758 08/15/23 2332 08/16/23 0344 08/16/23 1108 08/17/23 0424  NA 138 137 135 139  --  138  K 3.8 3.6 3.2* 3.3*  --  3.5  CL 104 101 102 105  --  103  CO2 24 25 25 25   --  27  GLUCOSE 97 114* 128* 107*  --  137*  BUN 13 10 21 20   --  17  CREATININE 1.37* 1.54* 1.64* 1.64*  --  1.45*  CALCIUM 8.6* 8.3* 7.8* 7.8*  --  7.3*  MG  --   --   --   --  2.0  --      Recent Results (from the past 240 hour(s))  Surgical PCR screen     Status: None   Collection Time: 08/13/23  3:52 AM   Specimen: Nasal Mucosa; Nasal Swab  Result Value Ref Range Status   MRSA, PCR NEGATIVE NEGATIVE Final   Staphylococcus aureus NEGATIVE NEGATIVE Final    Comment: (NOTE) The Xpert SA Assay (FDA approved for NASAL specimens in patients 34 years of age and older), is one component of a comprehensive surveillance program. It is not intended to diagnose infection nor to guide or monitor  treatment. Performed at [facility], [address] [place], [place] [address]      Radiology Studies: No results found.  Scheduled Meds:  amLODipine  10 mg Oral Daily   aspirin EC  81 mg Oral Daily   atorvastatin  40 mg Oral Daily   enoxaparin (LOVENOX) injection  120 mg Subcutaneous Q12H   feeding supplement  237 mL Oral BID BM   gabapentin  300 mg Oral BID   polyethylene glycol  17 g Oral Daily   senna  1 tablet Oral BID   sodium chloride flush  3 mL Intravenous Q12H   zolpidem  5 mg Oral QHS   Continuous Infusions:  cefTRIAXone (ROCEPHIN)  IV 2 g (08/16/23 2042)   linezolid (ZYVOX) IV 600 mg (08/17/23 1219)   metronidazole 500 mg (08/17/23 1022)     LOS: 9 days   Fran Lowes, MD [facility] P11/05/2023, 3:07 PM

## 2023-08-17 NOTE — Evaluation (Signed)
Physical Therapy RE-Evaluation Patient Details Name: Tanner Phillips MRN: [id number] DOB: [birth year] Today's Date: 08/17/2023  History of Present Illness  Pt is a 67 yo male admitted with worsening LE cellulitis and found to have LLE DVT. S/p LLE angiogram 11/01. Underwent L common femoral to ant tib bypass with L greater sapenous vein harvest and thrombectomy of anterior tib artery on 08/13/23. 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  PMH: ruptured disk in back, HTN, CAD s/p MI  Clinical Impression  Patient now post L 4th and 5th toe amputations with wound vac.  Able to tolerate a little pressure on L foot for transition to recliner this session.  He was heavily reliant on RW and moving R foot scooting on the floor to limit L weight bearing.  He is eager for home, but knows he is weak.  We discussed ramps and possibility to go to his son's home as wife cannot assist much.  Feel continued skilled PT in the acute setting indicated prior to d/c home with family support and HHPT.         If plan is discharge home, recommend the following: Assist for transportation;Assistance with cooking/housework;A little help with bathing/dressing/bathroom;A lot of help with walking and/or transfers   Can travel by private vehicle        Equipment Recommendations None recommended by PT  Recommendations for Other Services       Functional Status Assessment Patient has had a recent decline in their functional status and demonstrates the ability to make significant improvements in function in a reasonable and predictable amount of time.     Precautions / Restrictions Precautions Precautions: Fall Precaution Comments: wound vac L groin & L foot Restrictions LLE Weight Bearing: Weight bearing as tolerated      Mobility  Bed Mobility Overal bed mobility: Needs Assistance Bed Mobility: Supine to Sit     Supine to sit: Min assist, HOB  elevated, Used rails     General bed mobility comments: HHA to lift trunk upright    Transfers Overall transfer level: Needs assistance Equipment used: Rolling walker (2 wheels) Transfers: Sit to/from Stand, Bed to chair/wheelchair/BSC Sit to Stand: Min assist   Step pivot transfers: Min assist       General transfer comment: using RW to step to recliner with cues dragging limited weight tolerance L foot and scooting R foot    Ambulation/Gait                  Stairs            Wheelchair Mobility     Tilt Bed    Modified Rankin (Stroke Patients Only)       Balance Overall balance assessment: Needs assistance   Sitting balance-Leahy Scale: Good     Standing balance support: Bilateral upper extremity supported Standing balance-Leahy Scale: Poor Standing balance comment: UE support due to pain with L weight bearing                             Pertinent Vitals/Pain Pain Assessment Pain Assessment: Faces Faces Pain Scale: Hurts whole lot Pain Location: L foot with dependency and weight bearing Pain Descriptors / Indicators: Grimacing, Guarding, Discomfort, Burning, Other (Comment), Operative site guarding Pain Intervention(s): Monitored during session, Repositioned, Limited activity within patient's tolerance    Home Living Family/patient expects to be discharged to:: Private residence  Living Arrangements: Spouse/significant other Available Help at Discharge: Family;Available 24 hours/day Type of Home: House Home Access: Stairs to enter   [employer] of Steps: 2   Home Layout: One level Home Equipment: Agricultural consultant (2 wheels);Wheelchair - manual;Shower seat      Prior Function Prior Level of Function : Independent/Modified Independent             Mobility Comments: independent ADLs Comments: independent     Extremity/Trunk Assessment   Upper Extremity Assessment Upper Extremity Assessment: Overall WFL for  tasks assessed    Lower Extremity Assessment Lower Extremity Assessment: LLE deficits/detail LLE Deficits / Details: foot with wound vac and able to lift antigravity, but tender to touch with continued edema in lower leg and some oozing at L UE IV site       Communication   Communication Communication: No apparent difficulties  Cognition Arousal: Alert Behavior During Therapy: WFL for tasks assessed/performed, Anxious Overall Cognitive Status: Within Functional Limits for tasks assessed                                          General Comments General comments (skin integrity, edema, etc.): Wife in the room and discussed ramp for home entry (has two steps) also possibility to go to son's home (4 steps, but he can potentially make a ramp). also showed online options for metal ramps.  SpO2 100% on O2 so removed prior to mobility, reading into 80's with poor pleth, back to 90's without adding O2 though left O2 within pt's reach    Exercises     Assessment/Plan    PT Assessment Patient needs continued PT services  PT Problem List Pain;Decreased range of motion;Decreased mobility;Decreased activity tolerance;Cardiopulmonary status limiting activity;Decreased balance;Decreased strength       PT Treatment Interventions DME instruction;Functional mobility training;Balance training;Patient/family education;Gait training;Therapeutic exercise;Therapeutic activities;Wheelchair mobility training    PT Goals (Current goals can be found in the Care Plan section)  Acute Rehab PT Goals Patient Stated Goal: return home PT Goal Formulation: With patient/family Time For Goal Achievement: 08/31/23 Potential to Achieve Goals: Good    Frequency Min 1X/week     Co-evaluation               AM-PAC PT "6 Clicks" Mobility  Outcome Measure Help needed turning from your back to your side while in a flat bed without using bedrails?: None Help needed moving from lying on your  back to sitting on the side of a flat bed without using bedrails?: A Little Help needed moving to and from a bed to a chair (including a wheelchair)?: A Little Help needed standing up from a chair using your arms (e.g., wheelchair or bedside chair)?: A Little Help needed to walk in hospital room?: Total Help needed climbing 3-5 steps with a railing? : Total 6 Click Score: 15    End of Session Equipment Utilized During Treatment: Gait belt;Oxygen Activity Tolerance: Patient limited by pain Patient left: in chair   PT Visit Diagnosis: Other abnormalities of gait and mobility (R26.89);Difficulty in walking, not elsewhere classified (R26.2);Pain Pain - Right/Left: Left Pain - part of body: Ankle and joints of foot    Time: 2025-4270 PT Time Calculation (min) (ACUTE ONLY): 24 min   Charges:   PT Evaluation $PT Re-evaluation: 1 Re-eval PT Treatments $Therapeutic Activity: 8-22 mins PT General Charges $$ ACUTE PT VISIT: 1  Visit         Sheran Lawless, [place] [facility] Office:[phone] 08/17/2023   Elray Mcgregor 08/17/2023, 1:24 PM

## 2023-08-18 DIAGNOSIS — I82412 Acute embolism and thrombosis of left femoral vein: Secondary | ICD-10-CM | POA: Diagnosis not present

## 2023-08-18 LAB — COMPREHENSIVE METABOLIC PANEL
ALT: 17 U/L (ref 0–44)
AST: 19 U/L (ref 15–41)
Albumin: 1.9 g/dL — ABNORMAL LOW (ref 3.5–5.0)
Alkaline Phosphatase: 43 U/L (ref 38–126)
Anion gap: 10 (ref 5–15)
BUN: 14 mg/dL (ref 8–23)
CO2: 25 mmol/L (ref 22–32)
Calcium: 8.4 mg/dL — ABNORMAL LOW (ref 8.9–10.3)
Chloride: 104 mmol/L (ref 98–111)
Creatinine, Ser: 1.46 mg/dL — ABNORMAL HIGH (ref 0.61–1.24)
GFR, Estimated: 52 mL/min — ABNORMAL LOW (ref >60)
Glucose, Bld: 110 mg/dL — ABNORMAL HIGH (ref 70–99)
Potassium: 3.3 mmol/L — ABNORMAL LOW (ref 3.5–5.1)
Sodium: 139 mmol/L (ref 135–145)
Total Bilirubin: 0.6 mg/dL (ref <1.2)
Total Protein: 5.3 g/dL — ABNORMAL LOW (ref 6.5–8.1)

## 2023-08-18 LAB — CBC
HCT: 34.9 % — ABNORMAL LOW (ref 39.0–52.0)
Hemoglobin: 10.9 g/dL — ABNORMAL LOW (ref 13.0–17.0)
MCH: 26.7 pg (ref 26.0–34.0)
MCHC: 31.2 g/dL (ref 30.0–36.0)
MCV: 85.3 fL (ref 80.0–100.0)
Platelets: 420 10*3/uL — ABNORMAL HIGH (ref 150–400)
RBC: 4.09 MIL/uL — ABNORMAL LOW (ref 4.22–5.81)
RDW: 13.6 % (ref 11.5–15.5)
WBC: 11.7 10*3/uL — ABNORMAL HIGH (ref 4.0–10.5)
nRBC: 0 % (ref 0.0–0.2)

## 2023-08-18 MED ORDER — POTASSIUM CHLORIDE CRYS ER 20 MEQ PO TBCR
40.0000 meq | EXTENDED_RELEASE_TABLET | Freq: Once | ORAL | Status: AC
  Administered 2023-08-18: 40 meq via ORAL
  Filled 2023-08-18: qty 2

## 2023-08-18 NOTE — Progress Notes (Signed)
PT Cancellation Note  Patient Details Name: Tanner Phillips MRN: [id number] DOB: 28-Apr-1956   Cancelled Treatment:    Reason Eval/Treat Not Completed: Patient declined, no reason specified (pt reports having just returned to bed after [facility] with profuse bleeding from foot and unable to tolerate further mobility at this time)   Mazy Culton B Shaterica Mcclatchy 08/18/2023, 11:51 AM Merryl Hacker, PT Acute Rehabilitation Services Office: [phone]

## 2023-08-18 NOTE — Progress Notes (Signed)
PROGRESS NOTE  Tanner Phillips  YQI:[id number] DOB: [birth year] DOA: 08/06/2023 PCP: Ralene Ok, MD (Inactive)   Brief Narrative: Patient is a 67 year old male with history of peripheral artery disease, hypertension, coronary disease status/MI, morbid obesity who presented with left leg pain, ulcer on the left heel, blackish discoloration of left fourth and fifth toes. he was started on broad spectrum antibiotics for the cellulitis of left lower extremity.  Venous doppler of  LLE  also came out be positive, started on heparin.  ABIs on 10/30 showed severe bilateral lower extremity arterial disease.  Vascular surgery consulted.  Underwent arteriogram on 11/1, left common femoral to distal artery tibial bypass on 11/4.  PT/OT recommending SNF on discharge.    The patient underwent amputation of the 4th and 5th toes of the left foot following Left femoral to ATA bypass with vein. Wound vac is in place.  He has tolerated the procedure well. Vascular surgery has a plan to change wound vac on Monday.  Assessment & Plan:  Principal Problem:   DVT, lower extremity (HCC) Active Problems:   Cellulitis   Radiculopathy   Essential hypertension   Renal insufficiency   PAD (peripheral artery disease) (HCC)   Bilateral critical limb ischemia: Found to have severe peripheral artery disease bilaterally.  Vascular surgery consulted and following. Underwent arteriogram on 11/1, left common femoral to distal artery tibial bypass on 11/4, left anterior tibial thrombectomy. Currently on heparin drip. Per vascular surgery the patient is to remain on heparin for another 24 hours. Vascular surgery has also stated that it is okay to discontinue antibiotics. Continue aspirin.  Acute left lower extremity DVT: Doppler study showed acute DVT involving the left femoral vein, popliteal vein, posterior tibial vein and peroneal vein.  Continue heparin drip for another 24 hours.  Left lower extremity cellulitis: Presented  with ulcer on the left heel, fourth and fifth toe appear necrotic.  Leukocytosis persist.  He is afebrile.  Wound care following.  Currently on broad spectrum antibiotics: Ceftriaxone, Flagyl, linezolid.  Vascular surgery planning for amputation of left fourth and fifth toes tomorrow.  Per vascular surgery okay to discontinue antibiotics. WBC is slowly declining. Blood cultures were never taken prior to surgery.  Acute hypoxic respiratory failure/OSA: Does not use oxygen at home.  Desaturates mostly during .  Also looks volume overloaded with edema of the left lower extremity.  Currently on 2 L of oxygen with variable oxygen saturations.  The patient has incentive spirometry. Will check CXR.  AKI on CKD stage IIIa: Currently kidney function at baseline.  Looks like his baseline creatinine is around 1.5. Creatinine is 1.45 today.  Hypertension: On Norvasc.  Monitor blood pressure  Hyperlipidemia: On Lipitor  Chronic back pain: Follows with Dr. Lovell Sheehan as an outpatient.  Gets history of his injection.  Continue supportive care, pain management.  Morbid obesity: BMI 44.9  Deconditioning/debility: Patient seen by PT and OT and recommended SNF on discharge       DVT prophylaxis:SCDs Start: 08/06/23 1454     Code Status: Full Code  Family Communication: Discussed with wife at bedside on 11/8.  Patient status: Inpatient  Patient is from : Home  Anticipated discharge to: SNF  Estimated DC date: After clearance from vascular surgery   Consultants: Vascular surgery  Procedures: Arteriogram, bypass surgery  Antimicrobials:  Anti-infectives (From admission, onward)    Start     Dose/Rate Route Frequency Ordered Stop   08/14/23 1215  linezolid (ZYVOX) IVPB 600 mg  600 mg 300 mL/hr over 60 Minutes Intravenous Every 12 hours 08/14/23 1123 08/17/23 2238   08/12/23 1200  metroNIDAZOLE (FLAGYL) IVPB 500 mg  Status:  Discontinued        500 mg 100 mL/hr over 60 Minutes Intravenous  Every 12 hours 08/12/23 1059 08/17/23 1525   08/12/23 1200  cefTRIAXone (ROCEPHIN) 2 g in sodium chloride 0.9 % 100 mL IVPB  Status:  Discontinued        2 g 200 mL/hr over 30 Minutes Intravenous Every 24 hours 08/12/23 1124 08/17/23 1525   08/12/23 0715  ceFAZolin (ANCEF) IVPB 2g/100 mL premix       Note to Pharmacy: Send with pt to OR   2 g 200 mL/hr over 30 Minutes Intravenous To Short Stay 08/12/23 0708 08/13/23 0715   08/08/23 1000  cefadroxil (DURICEF) capsule 500 mg        500 mg Oral 2 times daily 08/07/23 1852 08/10/23 2108   08/06/23 1600  ceFAZolin (ANCEF) IVPB 2g/100 mL premix        2 g 200 mL/hr over 30 Minutes Intravenous Every 8 hours 08/06/23 1455 08/07/23 1626   08/06/23 1445  vancomycin (VANCOREADY) IVPB 2000 mg/400 mL       Placed in "And" Linked Group   2,000 mg 200 mL/hr over 120 Minutes Intravenous  Once 08/06/23 1432 08/06/23 1822   08/06/23 1445  vancomycin (VANCOREADY) IVPB 500 mg/100 mL       Placed in "And" Linked Group   500 mg 100 mL/hr over 60 Minutes Intravenous  Once 08/06/23 1432 08/06/23 1723       Subjective: Patient seen and examined the today.  Hemodynamically stable.  On 2 L of oxygen. The patient is resting comfortably. No new complaints.  Objective: Vitals:   08/18/23 0544 08/18/23 0808 08/18/23 1209 08/18/23 1606  BP: 131/70 (!) 143/69 (!) 140/77 135/77  Pulse: 92 96 (!) 102 (!) 106  Resp: 15 18 17 19   Temp: 98.5 F (36.9 C) 98.1 F (36.7 C) 98.2 F (36.8 C) 98.8 F (37.1 C)  TempSrc: Oral Oral Oral Oral  SpO2: 94% 94% 95% 96%  Weight:      Height:        Intake/Output Summary (Last 24 hours) at 08/18/2023 1630 Last data filed at 08/17/2023 1954 Gross per 24 hour  Intake --  Output 900 ml  Net -900 ml   Filed Weights   08/10/23 1543 08/13/23 0716 08/16/23 1236  Weight: 126 kg 122.5 kg 120.7 kg    Examination:  Exam:  Constitutional:  The patient is awake, alert, and oriented x 3. No acute distress. Respiratory:   No increased work of breathing. No wheezes, rales, or rhonchi No tactile fremitus Cardiovascular:  Regular rate and rhythm No murmurs, ectopy, or gallups. No lateral PMI. No thrills. Abdomen:  Abdomen is soft, non-tender, non-distended No hernias, masses, or organomegaly Normoactive bowel sounds.  Musculoskeletal:  No cyanosis, clubbing, or edema Left foot and lower extremity is bandaged. Skin:  No rashes, lesions, ulcers palpation of skin: no induration or nodules Neurologic:  CN 2-12 intact Sensation all 4 extremities intact Psychiatric:  Mental status Mood, affect appropriate Orientation to person, place, time  judgment and insight appear intact      Data Reviewed: I have personally reviewed following labs and imaging studies  CBC: Recent Labs  Lab 08/14/23 0758 08/15/23 0432 08/16/23 0344 08/17/23 0424 08/18/23 0257  WBC 15.7* 15.2* 14.9* 13.6* 11.7*  HGB 12.9* 11.8* 11.2*  11.4* 10.9*  HCT 40.5 36.0* 34.3* 35.0* 34.9*  MCV 86.5 85.5 86.0 85.8 85.3  PLT 426* 384 378 394 420*   Basic Metabolic Panel: Recent Labs  Lab 08/14/23 0758 08/15/23 2332 08/16/23 0344 08/16/23 1108 08/17/23 0424 08/18/23 0257  NA 137 135 139  --  138 139  K 3.6 3.2* 3.3*  --  3.5 3.3*  CL 101 102 105  --  103 104  CO2 25 25 25   --  27 25  GLUCOSE 114* 128* 107*  --  137* 110*  BUN 10 21 20   --  17 14  CREATININE 1.54* 1.64* 1.64*  --  1.45* 1.46*  CALCIUM 8.3* 7.8* 7.8*  --  7.3* 8.4*  MG  --   --   --  2.0  --   --      Recent Results (from the past 240 hour(s))  Surgical PCR screen     Status: None   Collection Time: 08/13/23  3:52 AM   Specimen: Nasal Mucosa; Nasal Swab  Result Value Ref Range Status   MRSA, PCR NEGATIVE NEGATIVE Final   Staphylococcus aureus NEGATIVE NEGATIVE Final    Comment: (NOTE) The Xpert SA Assay (FDA approved for NASAL specimens in patients 30 years of age and older), is one component of a comprehensive surveillance program. It is not  intended to diagnose infection nor to guide or monitor treatment. Performed at [facility], [address], [place], [place] [address]      Radiology Studies: DG CHEST PORT 1 VIEW  Result Date: 08/17/2023 CLINICAL DATA:  Hypoxia. EXAM: PORTABLE CHEST 1 VIEW COMPARISON:  08/15/2023. FINDINGS: The heart size and mediastinal contours are within normal limits. There is atherosclerotic calcification of the aorta. The pulmonary vasculature is prominent. Lung volumes are low with atelectasis at the left lung base. No effusion or pneumothorax. No acute osseous abnormality is seen. IMPRESSION: 1. Mild pulmonary vascular congestion. 2. Low lung volumes with atelectasis at the left lung base. Electronically Signed   By: Thornell Sartorius M.D.   On: 08/17/2023 21:01    Scheduled Meds:  amLODipine  10 mg Oral Daily   aspirin EC  81 mg Oral Daily   atorvastatin  40 mg Oral Daily   enoxaparin (LOVENOX) injection  120 mg Subcutaneous Q12H   feeding supplement  237 mL Oral BID BM   gabapentin  300 mg Oral BID   polyethylene glycol  17 g Oral Daily   senna  1 tablet Oral BID   sodium chloride flush  3 mL Intravenous Q12H   zolpidem  5 mg Oral QHS      LOS: 10 days   Fran Lowes, MD [facility] P11/06/2023, 4:30 PM

## 2023-08-18 NOTE — Progress Notes (Signed)
Left surgical foot started bleeding with wound vac. MD notified . Wet to dry dressing placed . Black foam left as packing. MD will follow up in the morning.

## 2023-08-18 NOTE — Progress Notes (Signed)
Orthopedic Tech Progress Note Patient Details:  Tanner Phillips 1956/07/14 [id number]  Ortho Devices Type of Ortho Device: Darco shoe Ortho Device/Splint Location: for LLE, at bedside Ortho Device/Splint Interventions: Ordered   Post Interventions Instructions Provided: Care of device, Adjustment of device  Docia Furl 08/18/2023, 3:02 PM

## 2023-08-18 NOTE — Progress Notes (Addendum)
[facility]  Subjective  - no new complaints other than his IV accidentally got pulled out.   Objective (!) 143/69 96 98.1 F (36.7 C) (Oral) 18 94%  Intake/Output Summary (Last 24 hours) at 08/18/2023 0834 Last data filed at 08/17/2023 1954 Gross per 24 hour  Intake --  Output 900 ml  Net -900 ml    Left LE foot vac and groin vac with good suction Motor of residual toes intact left foot Leg incision healing well, palpable AT pulse Lungs non labored breathing  Assessment/Planning: 08/13/23 and 08/16/23 for toe amps. 67 y.o. male is s/p L femoral to ATA bypass with vein and subsequent 4,5 toe amps 1 Day Post-Op   Palpable AT pulse with improved inflow Vacs with good suction over night IV will need to be replaced Mobility encouraged alternating with elevation.   Cont anticoagulation with Lovenox 120 q12  Mosetta Pigeon 08/18/2023 8:34 AM --  Laboratory Lab Results: Recent Labs    08/17/23 0424 08/18/23 0257  WBC 13.6* 11.7*  HGB 11.4* 10.9*  HCT 35.0* 34.9*  PLT 394 420*   BMET Recent Labs    08/17/23 0424 08/18/23 0257  NA 138 139  K 3.5 3.3*  CL 103 104  CO2 27 25  GLUCOSE 137* 110*  BUN 17 14  CREATININE 1.45* 1.46*  CALCIUM 7.3* 8.4*    COAG Lab Results  Component Value Date   INR 1.6 (H) 02/16/2008   INR 1.5 02/15/2008   INR 1.4 02/14/2008   No results found for: "PTT"  VASCULAR STAFF ADDENDUM: I have independently interviewed and examined the patient. I agree with the above.  Palpable DP. Wound vac on suction. Plan for vac change on Monday   Daria Pastures MD [facility] Phone Number: [phone] 08/18/2023 10:26 AM

## 2023-08-18 NOTE — Progress Notes (Signed)
OT Cancellation Note  Patient Details Name: Tanner Phillips MRN: [id number] DOB: Dec 24, 1955   Cancelled Treatment:    Reason Eval/Treat Not Completed: Other (comment) Noted earlier events w/ wound vac and bleeding; darco shoe at beside. Pt reports MD instructed pt to attempt OOB with darco shoe tomorrow. Will follow up tomorrow 11/10 for therapy sessions.  Lorre Munroe 08/18/2023, 2:32 PM

## 2023-08-19 LAB — CBC
HCT: 36 % — ABNORMAL LOW (ref 39.0–52.0)
Hemoglobin: 11.5 g/dL — ABNORMAL LOW (ref 13.0–17.0)
MCH: 27.4 pg (ref 26.0–34.0)
MCHC: 31.9 g/dL (ref 30.0–36.0)
MCV: 85.9 fL (ref 80.0–100.0)
Platelets: 410 10*3/uL — ABNORMAL HIGH (ref 150–400)
RBC: 4.19 MIL/uL — ABNORMAL LOW (ref 4.22–5.81)
RDW: 13.8 % (ref 11.5–15.5)
WBC: 11.7 10*3/uL — ABNORMAL HIGH (ref 4.0–10.5)
nRBC: 0 % (ref 0.0–0.2)

## 2023-08-19 LAB — GLUCOSE, CAPILLARY: Glucose-Capillary: 112 mg/dL — ABNORMAL HIGH (ref 70–99)

## 2023-08-19 MED ORDER — POTASSIUM CHLORIDE CRYS ER 20 MEQ PO TBCR
40.0000 meq | EXTENDED_RELEASE_TABLET | Freq: Once | ORAL | Status: AC
  Administered 2023-08-19: 40 meq via ORAL
  Filled 2023-08-19: qty 2

## 2023-08-19 NOTE — Progress Notes (Signed)
OT Cancellation Note  Patient Details Name: Tanner Phillips MRN: [id number] DOB: [birth year]   Cancelled Treatment:    Reason Eval/Treat Not Completed: Patient not medically ready (Bed level - MD request to hold at this time discussed with PT)  Mateo Flow 08/19/2023, 10:48 AM

## 2023-08-19 NOTE — Plan of Care (Signed)

## 2023-08-19 NOTE — Progress Notes (Signed)
PT Cancellation Note  Patient Details Name: Tanner Phillips MRN: [id number] DOB: 02-04-1956   Cancelled Treatment:    Reason Eval/Treat Not Completed: Medical issues which prohibited therapy. Pt to hold mobility today per vascular. Will see tomorrow.   Angelina Ok [facility] 08/19/2023, 11:56 AM Skip Mayer PT Acute [employer] [phone]

## 2023-08-19 NOTE — Progress Notes (Signed)
   08/19/23 0832  Assess: MEWS Score  Temp 98.3 F (36.8 C)  BP (!) 154/86  MAP (mmHg) 103  Pulse Rate 97  ECG Heart Rate (!) 103  Resp (!) 24  SpO2 97 %  O2 Device Room Air  Assess: MEWS Score  MEWS Temp 0  MEWS Systolic 0  MEWS Pulse 1  MEWS RR 1  MEWS LOC 0  MEWS Score 2  MEWS Score Color Yellow  Assess: if the MEWS score is Yellow or Red  Were vital signs accurate and taken at a resting state? Yes  Does the patient meet 2 or more of the SIRS criteria? Yes  Does the patient have a confirmed or suspected source of infection? Yes  MEWS guidelines implemented  Yes, yellow  Treat  MEWS Interventions Considered administering scheduled or prn medications/treatments as ordered  Take Vital Signs  Increase Vital Sign Frequency  Yellow: Q2hr x1, continue Q4hrs until patient remains green for 12hrs  Escalate  MEWS: Escalate Yellow: Discuss with charge nurse and consider notifying provider and/or RRT  Notify: Charge Nurse/RN  Name of Charge Nurse/RN Notified York Pellant  Assess: SIRS CRITERIA  SIRS Temperature  0  SIRS Pulse 1  SIRS Respirations  1  SIRS WBC 0  SIRS Score Sum  2

## 2023-08-19 NOTE — Progress Notes (Signed)
   08/19/23 1000  Assess: MEWS Score  Temp 98.3 F (36.8 C)  BP (!) 145/76  Level of Consciousness Alert  O2 Device Room Air  Assess: MEWS Score  MEWS Temp 0  MEWS Systolic 0  MEWS Pulse 1  MEWS RR 1  MEWS LOC 0  MEWS Score 2  MEWS Score Color Yellow  Assess: if the MEWS score is Yellow or Red  Were vital signs accurate and taken at a resting state? Yes  Does the patient meet 2 or more of the SIRS criteria? Yes  Does the patient have a confirmed or suspected source of infection? Yes  MEWS guidelines implemented  Yes, yellow  Treat  MEWS Interventions Considered administering scheduled or prn medications/treatments as ordered  Take Vital Signs  Increase Vital Sign Frequency  Yellow: Q2hr x1, continue Q4hrs until patient remains green for 12hrs  Escalate  MEWS: Escalate Yellow: Discuss with charge nurse and consider notifying provider and/or RRT  Assess: SIRS CRITERIA  SIRS Temperature  0  SIRS Pulse 1  SIRS Respirations  1  SIRS WBC 0  SIRS Score Sum  2

## 2023-08-19 NOTE — Progress Notes (Signed)
PROGRESS NOTE  Tanner Phillips  NWG:[id number] DOB: [birth year] DOA: 08/06/2023 PCP: Ralene Ok, MD (Inactive)   Brief Narrative: Patient is a 67 year old male with history of peripheral artery disease, hypertension, coronary disease status/MI, morbid obesity who presented with left leg pain, ulcer on the left heel, blackish discoloration of left fourth and fifth toes. he was started on broad spectrum antibiotics for the cellulitis of left lower extremity.  Venous doppler of  LLE  also came out be positive, started on heparin.  ABIs on 10/30 showed severe bilateral lower extremity arterial disease.  Vascular surgery consulted.  Underwent arteriogram on 11/1, left common femoral to distal artery tibial bypass on 11/4.  PT/OT recommending SNF on discharge.    The patient underwent amputation of the 4th and 5th toes of the left foot following Left femoral to ATA bypass with vein. Wound vac is in place.  He has tolerated the procedure well. Vascular surgery has a plan to change wound vac on Monday. The patient is to hold off on PT/OT until then He had profuse bleeding from the extremity after getting up on it yesterday.   Assessment & Plan:  Principal Problem:   DVT, lower extremity (HCC) Active Problems:   Cellulitis   Radiculopathy   Essential hypertension   Renal insufficiency   PAD (peripheral artery disease) (HCC)   Bilateral critical limb ischemia: Found to have severe peripheral artery disease bilaterally.  Vascular surgery consulted and following. Underwent arteriogram on 11/1, left common femoral to distal artery tibial bypass on 11/4, left anterior tibial thrombectomy. Currently on heparin drip. Per vascular surgery heparin has been stopped, and the patient has been transitioned to DOAC by pharmacy. Vascular surgery has also stated that it is okay to discontinue antibiotics. Continue aspirin.  Acute left lower extremity DVT: Doppler study showed acute DVT involving the left femoral vein,  popliteal vein, posterior tibial vein and peroneal vein.  Patient has been transitioned to DOAC.  Left lower extremity cellulitis: Presented with ulcer on the left heel, fourth and fifth toe appear necrotic.  Leukocytosis persist.  He is afebrile.  Wound care following.  Currently on broad spectrum antibiotics: Ceftriaxone, Flagyl, linezolid.  The fourth and fifth digits were amputated on 08/17/2023. Antibiotics have been stopped as per vascular surgery.  WBC is slowly declining. 11.7 today.  Acute hypoxic respiratory failure/OSA: Does not use oxygen at home.  Desaturates mostly during .  Also looks volume overloaded with edema of the left lower extremity.  Currently the patient is saturating at 91% on room air.  The patient has incentive spirometry. .  AKI on CKD stage IIIa: Currently kidney function at baseline.  Looks like his baseline creatinine is around 1.5. Creatinine is 1.46 today.  Hypertension: On Norvasc.  Monitor blood pressure  Hyperlipidemia: On Lipitor  Chronic back pain: Follows with Dr. Lovell Sheehan as an outpatient.  Gets history of his injection.  Continue supportive care, pain management.  Morbid obesity: BMI 44.9  Deconditioning/debility: Patient seen by PT and OT and recommended SNF on discharge       DVT prophylaxis:SCDs Start: 08/06/23 1454     Code Status: Full Code  Family Communication: Discussed with wife at bedside on 11/8.  Patient status: Inpatient  Patient is from : Home  Anticipated discharge to: SNF  Estimated DC date: After clearance from vascular surgery   Consultants: Vascular surgery  Procedures: Arteriogram, bypass surgery  Antimicrobials:  Anti-infectives (From admission, onward)    Start     Dose/Rate  Route Frequency Ordered Stop   08/14/23 1215  linezolid (ZYVOX) IVPB 600 mg        600 mg 300 mL/hr over 60 Minutes Intravenous Every 12 hours 08/14/23 1123 08/17/23 2238   08/12/23 1200  metroNIDAZOLE (FLAGYL) IVPB 500 mg  Status:   Discontinued        500 mg 100 mL/hr over 60 Minutes Intravenous Every 12 hours 08/12/23 1059 08/17/23 1525   08/12/23 1200  cefTRIAXone (ROCEPHIN) 2 g in sodium chloride 0.9 % 100 mL IVPB  Status:  Discontinued        2 g 200 mL/hr over 30 Minutes Intravenous Every 24 hours 08/12/23 1124 08/17/23 1525   08/12/23 0715  ceFAZolin (ANCEF) IVPB 2g/100 mL premix       Note to Pharmacy: Send with pt to OR   2 g 200 mL/hr over 30 Minutes Intravenous To Short Stay 08/12/23 0708 08/13/23 0715   08/08/23 1000  cefadroxil (DURICEF) capsule 500 mg        500 mg Oral 2 times daily 08/07/23 1852 08/10/23 2108   08/06/23 1600  ceFAZolin (ANCEF) IVPB 2g/100 mL premix        2 g 200 mL/hr over 30 Minutes Intravenous Every 8 hours 08/06/23 1455 08/07/23 1626   08/06/23 1445  vancomycin (VANCOREADY) IVPB 2000 mg/400 mL       Placed in "And" Linked Group   2,000 mg 200 mL/hr over 120 Minutes Intravenous  Once 08/06/23 1432 08/06/23 1822   08/06/23 1445  vancomycin (VANCOREADY) IVPB 500 mg/100 mL       Placed in "And" Linked Group   500 mg 100 mL/hr over 60 Minutes Intravenous  Once 08/06/23 1432 08/06/23 1723       Subjective: Patient seen and examined the today.  Hemodynamically stable.  On 2 L of oxygen. The patient is resting comfortably. No new complaints.  Objective: Vitals:   08/19/23 0422 08/19/23 0832 08/19/23 1000 08/19/23 1243  BP: (!) 145/76 (!) 154/86 (!) 145/76 127/70  Pulse: 96 97 (!) 101 (!) 119  Resp: 20 (!) 24 15 20   Temp: (!) 97.5 F (36.4 C) 98.3 F (36.8 C) 98.3 F (36.8 C) 98.4 F (36.9 C)  TempSrc: Oral Oral  Oral  SpO2: 93% 97% 94% 92%  Weight:      Height:        Intake/Output Summary (Last 24 hours) at 08/19/2023 1546 Last data filed at 08/19/2023 0837 Gross per 24 hour  Intake --  Output 500 ml  Net -500 ml   Filed Weights   08/10/23 1543 08/13/23 0716 08/16/23 1236  Weight: 126 kg 122.5 kg 120.7 kg    Examination:  Exam:  Constitutional:  The  patient is awake, alert, and oriented x 3. No acute distress. Respiratory:  No increased work of breathing. No wheezes, rales, or rhonchi No tactile fremitus Cardiovascular:  Regular rate and rhythm No murmurs, ectopy, or gallups. No lateral PMI. No thrills. Abdomen:  Abdomen is soft, non-tender, non-distended No hernias, masses, or organomegaly Normoactive bowel sounds.  Musculoskeletal:  No cyanosis, clubbing, or edema Left foot and lower extremity is bandaged. Skin:  No rashes, lesions, ulcers palpation of skin: no induration or nodules Neurologic:  CN 2-12 intact Sensation all 4 extremities intact Psychiatric:  Mental status Mood, affect appropriate Orientation to person, place, time  judgment and insight appear intact      Data Reviewed: I have personally reviewed following labs and imaging studies  CBC: Recent Labs  Lab 08/15/23 0432 08/16/23 0344 08/17/23 0424 08/18/23 0257 08/19/23 0346  WBC 15.2* 14.9* 13.6* 11.7* 11.7*  HGB 11.8* 11.2* 11.4* 10.9* 11.5*  HCT 36.0* 34.3* 35.0* 34.9* 36.0*  MCV 85.5 86.0 85.8 85.3 85.9  PLT 384 378 394 420* 410*   Basic Metabolic Panel: Recent Labs  Lab 08/14/23 0758 08/15/23 2332 08/16/23 0344 08/16/23 1108 08/17/23 0424 08/18/23 0257  NA 137 135 139  --  138 139  K 3.6 3.2* 3.3*  --  3.5 3.3*  CL 101 102 105  --  103 104  CO2 25 25 25   --  27 25  GLUCOSE 114* 128* 107*  --  137* 110*  BUN 10 21 20   --  17 14  CREATININE 1.54* 1.64* 1.64*  --  1.45* 1.46*  CALCIUM 8.3* 7.8* 7.8*  --  7.3* 8.4*  MG  --   --   --  2.0  --   --      Recent Results (from the past 240 hour(s))  Surgical PCR screen     Status: None   Collection Time: 08/13/23  3:52 AM   Specimen: Nasal Mucosa; Nasal Swab  Result Value Ref Range Status   MRSA, PCR NEGATIVE NEGATIVE Final   Staphylococcus aureus NEGATIVE NEGATIVE Final    Comment: (NOTE) The Xpert SA Assay (FDA approved for NASAL specimens in patients 29 years of age and  older), is one component of a comprehensive surveillance program. It is not intended to diagnose infection nor to guide or monitor treatment. Performed at [facility], [address], [place], [place] [address]      Radiology Studies: DG CHEST PORT 1 VIEW  Result Date: 08/17/2023 CLINICAL DATA:  Hypoxia. EXAM: PORTABLE CHEST 1 VIEW COMPARISON:  08/15/2023. FINDINGS: The heart size and mediastinal contours are within normal limits. There is atherosclerotic calcification of the aorta. The pulmonary vasculature is prominent. Lung volumes are low with atelectasis at the left lung base. No effusion or pneumothorax. No acute osseous abnormality is seen. IMPRESSION: 1. Mild pulmonary vascular congestion. 2. Low lung volumes with atelectasis at the left lung base. Electronically Signed   By: Thornell Sartorius M.D.   On: 08/17/2023 21:01    Scheduled Meds:  amLODipine  10 mg Oral Daily   aspirin EC  81 mg Oral Daily   atorvastatin  40 mg Oral Daily   enoxaparin (LOVENOX) injection  120 mg Subcutaneous Q12H   feeding supplement  237 mL Oral BID BM   gabapentin  300 mg Oral BID   polyethylene glycol  17 g Oral Daily   potassium chloride  40 mEq Oral Once   senna  1 tablet Oral BID   sodium chloride flush  3 mL Intravenous Q12H   zolpidem  5 mg Oral QHS      LOS: 11 days   Fran Lowes, MD [facility] P11/07/2023, 3:46 PM

## 2023-08-19 NOTE — Progress Notes (Addendum)
[facility] [place]  Subjective  - Doing a little better   Objective (!) 122/90 93 98.2 F (36.8 C) (Oral) 18 92% No intake or output data in the 24 hours ending 08/19/23 0346  Left foot dressing clean and dry Palpable AT pulse, motor of left LE intact Groin incisional vac with good suction Lungs non labored breathing  Assessment/Planning: POD # 2 s/p L femoral to ATA bypass with vein and subsequent 4,5 toe amps   Plan for wet to dry daily dressings, vac lost seal 2 times.  Plan for dressing change tomorrow left foot Hold PT today, he will do exercise in the bed for mobility Cont anticoagulation with Lovenox 120 q12  Will order morning labs   Mosetta Pigeon 08/19/2023 3:46 AM --  Laboratory Lab Results: Recent Labs    08/17/23 0424 08/18/23 0257  WBC 13.6* 11.7*  HGB 11.4* 10.9*  HCT 35.0* 34.9*  PLT 394 420*   BMET Recent Labs    08/17/23 0424 08/18/23 0257  NA 138 139  K 3.5 3.3*  CL 103 104  CO2 27 25  GLUCOSE 137* 110*  BUN 17 14  CREATININE 1.45* 1.46*  CALCIUM 7.3* 8.4*    COAG Lab Results  Component Value Date   INR 1.6 (H) 02/16/2008   INR 1.5 02/15/2008   INR 1.4 02/14/2008   No results found for: "PTT"  VASCULAR STAFF ADDENDUM: I have independently interviewed and examined the patient. I agree with the above.    Daria Pastures MD [facility] Phone Number: [phone] 08/19/2023 11:40 AM

## 2023-08-20 DIAGNOSIS — I82412 Acute embolism and thrombosis of left femoral vein: Secondary | ICD-10-CM | POA: Diagnosis not present

## 2023-08-20 LAB — CBC
HCT: 37.1 % — ABNORMAL LOW (ref 39.0–52.0)
Hemoglobin: 12.1 g/dL — ABNORMAL LOW (ref 13.0–17.0)
MCH: 28.2 pg (ref 26.0–34.0)
MCHC: 32.6 g/dL (ref 30.0–36.0)
MCV: 86.5 fL (ref 80.0–100.0)
Platelets: 428 10*3/uL — ABNORMAL HIGH (ref 150–400)
RBC: 4.29 MIL/uL (ref 4.22–5.81)
RDW: 14.1 % (ref 11.5–15.5)
WBC: 14.3 10*3/uL — ABNORMAL HIGH (ref 4.0–10.5)
nRBC: 0 % (ref 0.0–0.2)

## 2023-08-20 LAB — BASIC METABOLIC PANEL
Anion gap: 5 (ref 5–15)
BUN: 16 mg/dL (ref 8–23)
CO2: 26 mmol/L (ref 22–32)
Calcium: 8.1 mg/dL — ABNORMAL LOW (ref 8.9–10.3)
Chloride: 107 mmol/L (ref 98–111)
Creatinine, Ser: 1.24 mg/dL (ref 0.61–1.24)
GFR, Estimated: >60 mL/min (ref >60)
Glucose, Bld: 107 mg/dL — ABNORMAL HIGH (ref 70–99)
Potassium: 3.8 mmol/L (ref 3.5–5.1)
Sodium: 138 mmol/L (ref 135–145)

## 2023-08-20 NOTE — TOC Progression Note (Addendum)
Transition of Care (TOC) - Progression Note  Donn Pierini RN, BSN Transitions of Care Unit 4E- RN Case Manager See Treatment Team for direct phone #   Patient Details  Name: Tanner Phillips MRN: [id number] Date of Birth: 12/12/1955  Transition of Care [facility]) CM/SW Contact  Zenda Alpers, Lenn Sink, RN Phone Number: 08/20/2023, 2:10 PM  Clinical Narrative:    Msg received from bedside RN that pt and wife wanted to speak with CM regarding plans for transition home and [facility] needs.  Also noted new referral for SNF.  CM went to room to speak with pt and wife.  Per conversation confirmed that pt and wife do NOT wish for pt to go to SNF for rehab.  They voiced that they prefer pt to return home with [facility] for "rehab" needs. Orders have been placed for HHRN/PT/OT.  Per conversation with them on 11/8- referral for [facility] was sent to [facility] and accepted.  Wife is now informing CM that they plan to go stay with their daughter for the first 1-2 weeks. Address for daughterEliezer Champagne [address] [place] [address]  Discussed with pt and wife that CM would have to check with [facility] to see if they can cover this area where daughter lives for needed services, and if not then we may have to look at alternate agencies to service. Both pt and wife voiced understanding.   Wife again confirmed that they have ramp, transport chair, wheelchair, RW, BSC for home. No other DME needs noted.    CM called [facility] liaison- Corrie Dandy- to have her check on services in [facility]-  1250 after review- Corrie Dandy has confirmed that [facility] does not service [facility] and would not be able to provide services while pt at daughter's home.   Call made to [facility] that services [place] area- spoke w/ Rivka Barbara- will need to call back closer to discharge to see if nursing available for [facility] area.   CM will continue to follow for transition needs- and confirm HH once EDD known.   Home Wound VAC approval still  pending as well. (If needed) Per [facility] liaison due to pt's insurance with [facility]- approval has to go through insurance partner- Tour manager.    Expected Discharge Plan: Home w Home Health Services Barriers to Discharge: Continued Medical Work up  Expected Discharge Plan and Services In-house Referral: Clinical Social Work Discharge Planning Services: CM Consult Post Acute Care Choice: Durable Medical Equipment, Home Health Living arrangements for the past 2 months: Single Family Home                 DME Arranged: Vac DME Agency: [employer] Date DME Agency Contacted: 08/17/23 Time DME Agency Contacted: 1100 Representative spoke with at DME Agency: [place] HH Arranged: RN, PT, OT [facility] Agency: [facility]) Date HH Agency Contacted: 08/16/23 Time HH Agency Contacted: 1230 Representative spoke with at [facility] Agency: Clarise Cruz   Social Determinants of Health (SDOH) Interventions SDOH Screenings   Food Insecurity: No Food Insecurity (08/06/2023)  Housing: Patient Declined (08/06/2023)  Transportation Needs: No Transportation Needs (08/06/2023)  Utilities: Not At Risk (08/06/2023)  Tobacco Use: Low Risk  (08/16/2023)    Readmission Risk Interventions     No data to display

## 2023-08-20 NOTE — Progress Notes (Signed)
Physical Therapy Treatment Patient Details Name: Tanner Phillips MRN: [id number] DOB: [birth year] Today's Date: 08/20/2023   History of Present Illness Pt is a 67 yo male admitted 10/28 with worsening LE cellulitis and found to have LLE DVT. S/p LLE angiogram 11/01. Underwent L common femoral to ant tib bypass with L greater sapenous vein harvest and thrombectomy of anterior tib artery on 08/13/23. 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  PMH: ruptured disk in back, HTN, CAD s/p MI    PT Comments  The pt was agreeable to session with focus on progressing OOB mobility, but was significantly limited by pain in left foot with mobility. The pt was unable to tolerate use of L darco shoe, reports it challenges his balance and "throws him to the right", pt encouraged to bring in shoe for R foot to even out height difference. The pt was then encouraged to complete pivot to [facility] with minimal wt through LLE, but unable to decrease wt through LLE despite use of RW and max cues. HR to 138bpm with activity and pt reporting 10/10 pain. Will continue to benefit from skilled PT acutely to progress activity tolerance and stability in darco shoe prior to d/c.   If plan is discharge home, recommend the following: Assist for transportation;Assistance with cooking/housework;A little help with bathing/dressing/bathroom;A lot of help with walking and/or transfers   Can travel by private vehicle        Equipment Recommendations  None recommended by PT    Recommendations for Other Services       Precautions / Restrictions Precautions Precautions: Fall Precaution Comments: wound vac L groin, dressing on L foot Required Braces or Orthoses: Other Brace Other Brace: darco shoe L foot Restrictions Weight Bearing Restrictions: Yes LLE Weight Bearing: Weight bearing as tolerated Other Position/Activity Restrictions: in darco shoe     Mobility  Bed  Mobility Overal bed mobility: Needs Assistance Bed Mobility: Supine to Sit, Sit to Supine     Supine to sit: HOB elevated, Used rails, Supervision Sit to supine: Supervision (assist with lines; no physical assist needed)   General bed mobility comments: increased time and effort with trunk, used rails    Transfers Overall transfer level: Needs assistance Equipment used: Rolling walker (2 wheels) Transfers: Bed to chair/wheelchair/BSC, Sit to/from Stand Sit to Stand: Min assist   Step pivot transfers: Mod assist       General transfer comment: attempted to teach pivot transfer, pt taking steps. unable to decrease wt bearing on LLE and refusing to use L darco shoe    Ambulation/Gait               General Gait Details: deferred due to elevated pain with pivot. declining to use darco shoe     Balance Overall balance assessment: Needs assistance Sitting-balance support: Single extremity supported, No upper extremity supported, Feet supported Sitting balance-Leahy Scale: Good     Standing balance support: Bilateral upper extremity supported Standing balance-Leahy Scale: Poor Standing balance comment: UE support due to pain with L weight bearing                            Cognition Arousal: Alert Behavior During Therapy: WFL for tasks assessed/performed, Anxious Overall Cognitive Status: Within Functional Limits for tasks assessed  General Comments: impuslive with poor safety awareness. declines use of darco shoe and not able to follow limited wt bearing on LLE        Exercises      General Comments General comments (skin integrity, edema, etc.): HR to 138 bpm      Pertinent Vitals/Pain Pain Assessment Pain Assessment: Faces Pain Score: 10-Worst pain ever Faces Pain Scale: Hurts whole lot Pain Location: L foot with mobility Pain Descriptors / Indicators: Grimacing, Guarding, Discomfort, Burning,  Other (Comment), Operative site guarding Pain Intervention(s): Limited activity within patient's tolerance, Monitored during session, Repositioned, RN gave pain meds during session     PT Goals (current goals can now be found in the care plan section) Acute Rehab PT Goals Patient Stated Goal: return home PT Goal Formulation: With patient/family Time For Goal Achievement: 08/31/23 Potential to Achieve Goals: Good Progress towards PT goals: Progressing toward goals    Frequency    Min 1X/week       AM-PAC PT "6 Clicks" Mobility   Outcome Measure  Help needed turning from your back to your side while in a flat bed without using bedrails?: None Help needed moving from lying on your back to sitting on the side of a flat bed without using bedrails?: A Little Help needed moving to and from a bed to a chair (including a wheelchair)?: A Lot Help needed standing up from a chair using your arms (e.g., wheelchair or bedside chair)?: A Little Help needed to walk in hospital room?: Total Help needed climbing 3-5 steps with a railing? : Total 6 Click Score: 14    End of Session Equipment Utilized During Treatment: Gait belt Activity Tolerance: Patient limited by pain Patient left: in bed;with call bell/phone within reach;with bed alarm set (MD present for dressing change) Nurse Communication: Mobility status PT Visit Diagnosis: Other abnormalities of gait and mobility (R26.89);Difficulty in walking, not elsewhere classified (R26.2);Pain Pain - Right/Left: Left Pain - part of body: Ankle and joints of foot     Time: 1456-1516 PT Time Calculation (min) (ACUTE ONLY): 20 min  Charges:    $Therapeutic Activity: 8-22 mins PT General Charges $$ ACUTE PT VISIT: 1 Visit                     Vickki Muff, PT, DPT   Acute Rehabilitation Department Office [phone] Secure Chat Communication Preferred   Ronnie Derby 08/20/2023, 3:21 PM

## 2023-08-20 NOTE — Progress Notes (Signed)
PROGRESS NOTE Tanner Phillips  HYQ:[id number] DOB: 04-15-1956 DOA: 08/06/2023 PCP: Ralene Ok, MD (Inactive)  Brief Narrative/Hospital Course:  67yom w/ PVD, Hypertension, coronary disease status/MI, morbid obesity who presented with left leg pain, ulcer on the left heel, blackish discoloration of left fourth and fifth toes,was started on broad spectrum antibiotics for the cellulitis of left lower extremity.  Venous doppler of  LLE  also came out be positive, started on heparin.  ABIs on 10/30 showed severe bilateral lower extremity arterial disease.  Vascular surgery consulted s/p arteriogram on 11/1 and subsequently left common femoral to distal artery tibial bypass on 11/4. PT/OT recommending SNF on discharge.  He is  s/p amputation of the 4th and 5th toes of the left foot following Left femoral to ATA bypass with vein. Wound vac is in place.    Subjective: Patient seen and examined this morning. He is resting comfortably at the bedside chair, wife is at the bedside.  Assessment and Plan: Principal Problem:   DVT, lower extremity (HCC) Active Problems:   Cellulitis   Radiculopathy   Essential hypertension   Renal insufficiency   PAD (peripheral artery disease) (HCC)   Bilateral critical limb ischemia W/Severe PVD Left lower extremity cellulitis W/ left fourth and fifth toe appearing necrotic S/p left femoral to AT bypass with vein  11/4 Subsequent 4/5 toe amputation 11/8: VS following, having palpable bypass pulses, continue wet-to-dry dressing change left foot daily, still needing IV pain management for dressing change,urrently plan for SNF- PT OT to reeval. Cont DARCO shoe OOB. Broad-spectrum antibiotics discontinued after discussion with vascular.  Monitor temperature and WBC count. It is at 14k today but no fever.  Left heel appears blackish Recent Labs  Lab 08/16/23 0344 08/17/23 0424 08/18/23 0257 08/19/23 0346 08/20/23 0321  WBC 14.9* 13.6* 11.7* 11.7* 14.3*     Acute  hypoxic aspiratory failure OSA: Not on home oxygen desaturating mostly during night with some fluid overload with leg edema doing well.  Continue I-S  AKI on CKD 3a: Stable.  Monitor.  HTN: Stable on Norvasc  HLD: Continue Lipitor  Chronic back pain: He gets injection in the back followed by Dr. Lovell Sheehan continue pain management supportive care  Deconditioning/debility: Continue PT OT, recommending SNF so far  Morbid Obesity:Patient's Body mass index is 44.26 kg/m. : Will benefit with PCP follow-up, weight loss  healthy lifestyle and outpatient sleep evaluation.  DVT prophylaxis: SCDs Start: 08/06/23 1454 Code Status:   Code Status: Full Code Family Communication: plan of care discussed with patient/wife at bedside. Patient status is: Inpatient because of critical limb ischemia Level of care: Progressive   Dispo: The patient is from: home            Anticipated disposition: Anticipating skilled nursing facility. Objective: Vitals last 24 hrs: Vitals:   08/20/23 0503 08/20/23 0755 08/20/23 1008 08/20/23 1222  BP: 137/75 130/70 (!) 151/78 (!) 151/86  Pulse: 97 96  96  Resp: (!) 23 20  20   Temp: 98.9 F (37.2 C) 98.1 F (36.7 C)  99.1 F (37.3 C)  TempSrc: Oral Oral  Oral  SpO2: 91% 91%  92%  Weight:      Height:       Weight change:   Physical Examination: General exam: alert awake, older than stated age HEENT:Oral mucosa moist, Ear/Nose WNL grossly Respiratory system: bilaterally clear BS, no use of accessory muscle Cardiovascular system: S1 & S2 +, No JVD. Gastrointestinal system: Abdomen soft,NT,ND, BS+ Nervous System:Alert, awake, moving  extremities. Extremities: LE edema neg,distal peripheral pulses palpable.  Skin: No rashes,no icterus. MSK: Normal muscle bulk,tone, power  Medications reviewed:  Scheduled Meds:  amLODipine  10 mg Oral Daily   aspirin EC  81 mg Oral Daily   atorvastatin  40 mg Oral Daily   enoxaparin (LOVENOX) injection  120 mg  Subcutaneous Q12H   feeding supplement  237 mL Oral BID BM   gabapentin  300 mg Oral BID   polyethylene glycol  17 g Oral Daily   senna  1 tablet Oral BID   sodium chloride flush  3 mL Intravenous Q12H   zolpidem  5 mg Oral QHS   Continuous Infusions:    Diet Order             Diet Heart Room service appropriate? Yes; Fluid consistency: Thin  Diet effective now                  No intake or output data in the 24 hours ending 08/20/23 1327 Net IO Since Admission: 882.74 mL [08/20/23 1327]  Wt Readings from Last 3 Encounters:  08/16/23 120.7 kg  07/27/20 117 kg  05/21/17 115.7 kg     Unresulted Labs (From admission, onward)     Start     Ordered   08/17/23 0500  CBC  Daily,   R     Question:  Specimen collection method  Answer:  Lab=Lab collect   08/16/23 1543          Data Reviewed: I have personally reviewed following labs and imaging studies CBC: Recent Labs  Lab 08/16/23 0344 08/17/23 0424 08/18/23 0257 08/19/23 0346 08/20/23 0321  WBC 14.9* 13.6* 11.7* 11.7* 14.3*  HGB 11.2* 11.4* 10.9* 11.5* 12.1*  HCT 34.3* 35.0* 34.9* 36.0* 37.1*  MCV 86.0 85.8 85.3 85.9 86.5  PLT 378 394 420* 410* 428*   Basic Metabolic Panel: Recent Labs  Lab 08/15/23 2332 08/16/23 0344 08/16/23 1108 08/17/23 0424 08/18/23 0257 08/20/23 0321  NA 135 139  --  138 139 138  K 3.2* 3.3*  --  3.5 3.3* 3.8  CL 102 105  --  103 104 107  CO2 25 25  --  27 25 26   GLUCOSE 128* 107*  --  137* 110* 107*  BUN 21 20  --  17 14 16   CREATININE 1.64* 1.64*  --  1.45* 1.46* 1.24  CALCIUM 7.8* 7.8*  --  7.3* 8.4* 8.1*  MG  --   --  2.0  --   --   --    GFR: Estimated Creatinine Clearance: 67.8 mL/min (by C-G formula based on SCr of 1.24 mg/dL). Liver Function Tests: Recent Labs  Lab 08/18/23 0257  AST 19  ALT 17  ALKPHOS 43  BILITOT 0.6  PROT 5.3*  ALBUMIN 1.9*  CBG: Recent Labs  Lab 08/19/23 0648  GLUCAP 112*   Recent Results (from the past 240 hour(s))  Surgical PCR  screen     Status: None   Collection Time: 08/13/23  3:52 AM   Specimen: Nasal Mucosa; Nasal Swab  Result Value Ref Range Status   MRSA, PCR NEGATIVE NEGATIVE Final   Staphylococcus aureus NEGATIVE NEGATIVE Final    Comment: (NOTE) The Xpert SA Assay (FDA approved for NASAL specimens in patients 106 years of age and older), is one component of a comprehensive surveillance program. It is not intended to diagnose infection nor to guide or monitor treatment. Performed at [facility], [address] [place], [place] [address]     Antimicrobials: Anti-infectives (From admission, onward)    Start     Dose/Rate Route Frequency Ordered Stop   08/14/23 1215  linezolid (ZYVOX) IVPB 600 mg        600 mg 300 mL/hr over 60 Minutes Intravenous Every 12 hours 08/14/23 1123 08/17/23 2238   08/12/23 1200  metroNIDAZOLE (FLAGYL) IVPB 500 mg  Status:  Discontinued        500 mg 100 mL/hr over 60 Minutes Intravenous Every 12 hours 08/12/23 1059 08/17/23 1525   08/12/23 1200  cefTRIAXone (ROCEPHIN) 2 g in sodium chloride 0.9 % 100 mL IVPB  Status:  Discontinued        2 g 200 mL/hr over 30 Minutes Intravenous Every 24 hours 08/12/23 1124 08/17/23 1525   08/12/23 0715  ceFAZolin (ANCEF) IVPB 2g/100 mL premix       Note to Pharmacy: Send with pt to OR   2 g 200 mL/hr over 30 Minutes Intravenous To Short Stay 08/12/23 0708 08/13/23 0715   08/08/23 1000  cefadroxil (DURICEF) capsule 500 mg        500 mg Oral 2 times daily 08/07/23 1852 08/10/23 2108   08/06/23 1600  ceFAZolin (ANCEF) IVPB 2g/100 mL premix        2 g 200 mL/hr over 30 Minutes Intravenous Every 8 hours 08/06/23 1455 08/07/23 1626   08/06/23 1445  vancomycin (VANCOREADY) IVPB 2000 mg/400 mL       Placed in "And" Linked Group   2,000 mg 200 mL/hr over 120 Minutes Intravenous  Once 08/06/23 1432 08/06/23 1822   08/06/23 1445  vancomycin (VANCOREADY) IVPB 500 mg/100 mL       Placed in "And" Linked Group   500 mg 100 mL/hr over 60  Minutes Intravenous  Once 08/06/23 1432 08/06/23 1723      Culture/Microbiology No results found for: "SDES", "SPECREQUEST", "CULT", "REPTSTATUS"  Radiology Studies: No results found.   LOS: 12 days   Lanae Boast, MD [facility]  08/20/2023, 1:27 PM

## 2023-08-20 NOTE — Progress Notes (Signed)
  Progress Note    08/20/2023 7:51 AM 4 Days Post-Op  Subjective:  L heel pain   Vitals:   08/19/23 2320 08/20/23 0503  BP: 138/75 137/75  Pulse: (!) 103 97  Resp: (!) 21 (!) 23  Temp: 98.2 F (36.8 C) 98.9 F (37.2 C)  SpO2: 92% 91%   Physical Exam: Lungs:  non labored Incisions:  groin with incisional vac Extremities:  palpable bypass pulse Neurologic: A&O  CBC    Component Value Date/Time   WBC 14.3 (H) 08/20/2023 0321   RBC 4.29 08/20/2023 0321   HGB 12.1 (L) 08/20/2023 0321   HCT 37.1 (L) 08/20/2023 0321   PLT 428 (H) 08/20/2023 0321   MCV 86.5 08/20/2023 0321   MCH 28.2 08/20/2023 0321   MCHC 32.6 08/20/2023 0321   RDW 14.1 08/20/2023 0321   LYMPHSABS 1.2 08/06/2023 0927   MONOABS 1.3 (H) 08/06/2023 0927   EOSABS 0.1 08/06/2023 0927   BASOSABS 0.1 08/06/2023 0927    BMET    Component Value Date/Time   NA 138 08/20/2023 0321   K 3.8 08/20/2023 0321   CL 107 08/20/2023 0321   CO2 26 08/20/2023 0321   GLUCOSE 107 (H) 08/20/2023 0321   BUN 16 08/20/2023 0321   CREATININE 1.24 08/20/2023 0321   CALCIUM 8.1 (L) 08/20/2023 0321   GFRNONAA >60 08/20/2023 0321   GFRAA 52 (L) 05/21/2017 0010    INR    Component Value Date/Time   INR 1.6 (H) 02/16/2008 0525     Intake/Output Summary (Last 24 hours) at 08/20/2023 0751 Last data filed at 08/19/2023 8657 Gross per 24 hour  Intake --  Output 500 ml  Net -500 ml     Assessment/Plan:  67 y.o. male is s/p L femoral to ATA bypass with vein and subsequent 4,5 toe amp 4 Days Post-Op   Palpable bypass pulse Wet to dry dressing changes L foot daily; still requiring IV pain medication for dressing changes PT/OT to re-evaluate today; currently recommending SNF; OOB with darco shoe   Emilie Rutter, PA-C [facility] [phone] 08/20/2023 7:51 AM

## 2023-08-20 NOTE — Hospital Course (Addendum)
67yom w/ PVD, Hypertension, coronary disease status/MI, morbid obesity who presented with left leg pain, ulcer on the left heel, blackish discoloration of left fourth and fifth toes,was started on broad spectrum antibiotics for the cellulitis of left lower extremity.  Venous doppler of  LLE  also came out be positive, started on heparin.  ABIs on 10/30 showed severe bilateral lower extremity arterial disease.  Vascular surgery consulted s/p arteriogram on 11/1 and subsequently left common femoral to distal artery tibial bypass on 11/4. PT/OT recommending SNF on discharge.  He is  s/p amputation of the 4th and 5th toes of the left foot following Left femoral to ATA bypass with vein. Wound vac is in place.

## 2023-08-20 NOTE — Progress Notes (Signed)
Attempted to see pt. Noted Dr. Dustin Flock note over the weekend asking to hold therapy until after would vac is replaced due to profuse bleeding over the weekend. Will defer treatment today and schedule for tomorrow 08/21/23.  Tory Emerald, [place] [phone]

## 2023-08-20 NOTE — Progress Notes (Signed)
Mobility Specialist Progress Note:   08/20/23 1015  Mobility  Activity Transferred from bed to chair  Level of Assistance Minimal assist, patient does 75% or more  Assistive Device Front wheel walker  Distance Ambulated (ft) 3 ft  LLE Weight Bearing WBAT  Activity Response Tolerated well  Mobility Referral Yes  $Mobility charge 1 Mobility  Mobility Specialist Start Time (ACUTE ONLY) 0930  Mobility Specialist Stop Time (ACUTE ONLY) 0945  Mobility Specialist Time Calculation (min) (ACUTE ONLY) 15 min   Pre Mobility: 107 HR  During Mobility: 124 HR   Pt received in bed, agreeable to mobility. MinA for bed mobility. CG to stand and transfer to chair. Pt displayed minimal WB on LLE during ambulation with slow shuffles to chair from bedside. Denied any other discomfort, asymptomatic throughout. Pt left in chair with call bell in reach and all needs met.   Leory Plowman  Mobility Specialist Please contact via [employer] office at [phone]

## 2023-08-20 NOTE — Care Management Important Message (Signed)
Important Message  Patient Details  Name: Tanner Phillips MRN: [id number] Date of Birth: [birth year]   Important Message Given:  Yes - Medicare IM     Sherilyn Banker 08/20/2023, 2:55 PM

## 2023-08-21 ENCOUNTER — Other Ambulatory Visit (HOSPITAL_COMMUNITY): Payer: Self-pay

## 2023-08-21 DIAGNOSIS — I82412 Acute embolism and thrombosis of left femoral vein: Secondary | ICD-10-CM | POA: Diagnosis not present

## 2023-08-21 LAB — CBC
HCT: 37.5 % — ABNORMAL LOW (ref 39.0–52.0)
Hemoglobin: 12.1 g/dL — ABNORMAL LOW (ref 13.0–17.0)
MCH: 27.8 pg (ref 26.0–34.0)
MCHC: 32.3 g/dL (ref 30.0–36.0)
MCV: 86 fL (ref 80.0–100.0)
Platelets: 414 10*3/uL — ABNORMAL HIGH (ref 150–400)
RBC: 4.36 MIL/uL (ref 4.22–5.81)
RDW: 14 % (ref 11.5–15.5)
WBC: 14.5 10*3/uL — ABNORMAL HIGH (ref 4.0–10.5)
nRBC: 0 % (ref 0.0–0.2)

## 2023-08-21 LAB — BASIC METABOLIC PANEL
Anion gap: 8 (ref 5–15)
BUN: 14 mg/dL (ref 8–23)
CO2: 25 mmol/L (ref 22–32)
Calcium: 8.5 mg/dL — ABNORMAL LOW (ref 8.9–10.3)
Chloride: 106 mmol/L (ref 98–111)
Creatinine, Ser: 1.26 mg/dL — ABNORMAL HIGH (ref 0.61–1.24)
GFR, Estimated: >60 mL/min (ref >60)
Glucose, Bld: 102 mg/dL — ABNORMAL HIGH (ref 70–99)
Potassium: 3.8 mmol/L (ref 3.5–5.1)
Sodium: 139 mmol/L (ref 135–145)

## 2023-08-21 MED ORDER — APIXABAN 5 MG PO TABS
5.0000 mg | ORAL_TABLET | Freq: Two times a day (BID) | ORAL | Status: DC
  Administered 2023-08-21: 5 mg via ORAL
  Filled 2023-08-21: qty 1

## 2023-08-21 MED ORDER — ATORVASTATIN CALCIUM 40 MG PO TABS
40.0000 mg | ORAL_TABLET | Freq: Every day | ORAL | 0 refills | Status: DC
  Filled 2023-08-21: qty 30, 30d supply, fill #0

## 2023-08-21 MED ORDER — APIXABAN 5 MG PO TABS
5.0000 mg | ORAL_TABLET | Freq: Two times a day (BID) | ORAL | 0 refills | Status: DC
  Filled 2023-08-21: qty 60, 30d supply, fill #0

## 2023-08-21 MED ORDER — ASPIRIN 81 MG PO TBEC
81.0000 mg | DELAYED_RELEASE_TABLET | Freq: Every day | ORAL | 0 refills | Status: AC
  Filled 2023-08-21: qty 30, 30d supply, fill #0

## 2023-08-21 NOTE — TOC Benefit Eligibility Note (Signed)

## 2023-08-21 NOTE — TOC Transition Note (Signed)
Transition of Care (TOC) - CM/SW Discharge Note Donn Pierini RN, BSN Transitions of Care Unit 4E- RN Case Manager See Treatment Team for direct phone #   Patient Details  Name: Tanner Phillips MRN: [id number] Date of Birth: 07/09/1956  Transition of Care [facility]) CM/SW Contact:  Darrold Span, RN Phone Number: 08/21/2023, 2:20 PM   Clinical Narrative:    Pt stable for transition home today, Per [facility]- pt will not need home VAC- plan to continue wet-to-dry drsg changes.   CM has notified [facility] liaison to cancel home [facility] order as it is no longer needed at this time.   CM also spoke with pt and wife at bedside- to let them  know [facility] liaison confirmed that they can not service pt while they are at daughter's home in [facility], [place] also has reached out to [facility] and waiting to see if they can service. Discussed with pt and wife that if Cm is unable to find [facility] agency to service in [facility] are they ok with drsg changes and delay for start of care until they return home-and [facility] can begin services- pt/wife voiced they are ok with this plan if CM unable to secure an agency for while they are at daughter- wife voice she and their daughter who is a nurse can do the drsg changes.  Return call from [facility]- do not have staffing for RN and PT in [facility].  Calls also made to: [facility]- unable to staff in [facility]- do not service [facility]- unable to staff for nursing currently in [facility]- after review- referral has been accepted for [facility] needs in both [facility] and on pt's return home. Frances Furbish has daughter's address and will contact pt to do start care there then transition pt to [facility] branch on return home.   1415- call made to pt's wife to update on [facility] arrangements, liaison w/ [facility] also updated that referral will be covered by [facility].    Final next level of care: Home w Home Health Services Barriers to Discharge: Barriers  Resolved   Patient Goals and CMS Choice CMS Medicare.gov Compare Post Acute Care list provided to:: Patient Choice offered to / list presented to : Patient, Spouse  Discharge Placement               Home w/ [facility]          Discharge Plan and Services Additional resources added to the After Visit Summary for   In-house Referral: Clinical Social Work Discharge Planning Services: CM Consult Post Acute Care Choice: Durable Medical Equipment, Home Health          DME Arranged: Vac DME Agency: NA Date DME Agency Contacted: 08/17/23 Time DME Agency Contacted: 1100 Representative spoke with at DME Agency: [facility] HH Arranged: RN, PT, OT [facility] Agency: [facility] Date [facility] Agency Contacted: 08/21/23 Time HH Agency Contacted: 1330 Representative spoke with at [facility] Agency: Lorenza Chick  Social Determinants of Health (SDOH) Interventions SDOH Screenings   Food Insecurity: No Food Insecurity (08/06/2023)  Housing: Patient Declined (08/06/2023)  Transportation Needs: No Transportation Needs (08/06/2023)  Utilities: Not At Risk (08/06/2023)  Tobacco Use: Low Risk  (08/16/2023)     Readmission Risk Interventions    08/21/2023    2:20 PM  Readmission Risk Prevention Plan  Post Dischage Appt Complete  Medication Screening Complete  Transportation Screening Complete

## 2023-08-21 NOTE — Discharge Summary (Signed)
Physician Discharge Summary  Tanner Phillips:[id number] DOB: [birth year] DOA: 08/06/2023  PCP: Ralene Ok, MD  Admit date: 08/06/2023 Discharge date: 08/21/2023 Recommendations for Outpatient Follow-up:  Follow up with PCP in 1 weeks-call for appointment Please obtain BMP/CBC in one week  Discharge Dispo: home w/ [facility] Discharge Condition: Stable Code Status:   Code Status: Full Code Diet recommendation:  Diet Order             Diet Heart Room service appropriate? Yes; Fluid consistency: Thin  Diet effective now                    Brief/Interim Summary:  67yom w/ PVD, Hypertension, coronary disease status/MI, morbid obesity who presented with left leg pain, ulcer on the left heel, blackish discoloration of left fourth and fifth toes,was started on broad spectrum antibiotics for the cellulitis of left lower extremity.  Venous doppler of  LLE  also came out be positive, started on heparin.  ABIs on 10/30 showed severe bilateral lower extremity arterial disease.  Vascular surgery consulted s/p arteriogram on 11/1 and subsequently left common femoral to distal artery tibial bypass on 11/4. PT/OT recommending SNF on discharge.  He is  s/p amputation of the 4th and 5th toes of the left foot following Left femoral to ATA bypass with vein. Wound vac is in place-and has been removed, he did pretty well with PT OT and has advised home, at this time patient is eager to go home given his good family support.  Seen by vascular surgery Dr. Chestine Spore there is outpatient follow-up in 2 weeks and okay to discharge on aspirin and statin.   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 Cont DARCO shoe OOB. Broad-spectrum antibiotics discontinued after discussion with vascular.  Monitor temperature and WBC count.  Wbc 14k but no fever.  Left heel appears blackish Recent Labs  Lab 08/17/23 0424 08/18/23 0257 08/19/23 0346 08/20/23 0321 08/21/23 0519  WBC 13.6* 11.7* 11.7* 14.3* 14.5*    Acute hypoxic aspiratory failure OSA: Not on home oxygen desaturating mostly during night with some fluid overload with leg edema doing well.  Currently he is doing well on room air continue CPAP bedtime  Acute DVT on left femoral vein left popliteal vein and left posterior tibial vein: on lovenox- change to DOAC for discharge  AKI on CKD 3a: Stable.  Monitor.  Follow-up outpatient with labs in 1 week  HTN: Stable on Norvasc  HLD: Continue Lipitor  Chronic back pain: He gets injection in the back followed by Dr. Lovell Sheehan continue pain management supportive care  Deconditioning/debility: Continue PT OT cleared for discharge home with home health  Morbid Obesity:Patient's Body mass index is 44.26 kg/m. : Will benefit with PCP follow-up, weight loss  healthy lifestyle and outpatient sleep evaluation.  Consults: Vascular surgery Subjective: Alert awake oriented resting comfortably.  He is eager to go home today  Discharge Exam: Vitals:   08/21/23 0853 08/21/23 0856  BP: 120/80 120/80  Pulse: 100   Resp: 19   Temp:    SpO2: 92%    General: Pt is alert, awake, not in acute distress Cardiovascular: RRR, S1/S2 +, no rubs, no gallops Respiratory: CTA bilaterally, no wheezing, no rhonchi Abdominal: Soft, NT, ND, bowel sounds + Extremities: no edema, no cyanosis  Discharge Instructions  Discharge Instructions     Discharge instructions   Complete by: As  directed    Please call call MD or return to ER for similar or worsening recurring problem that brought you to hospital or if any fever,nausea/vomiting,abdominal pain, uncontrolled pain, chest pain,  shortness of breath or any other alarming symptoms.  Please follow-up your doctor as instructed in a week time and call the office for appointment.  Please avoid alcohol, smoking, or any other illicit substance and maintain healthy habits including taking your regular medications as prescribed.  You were cared for by a hospitalist during your hospital stay. If you have any questions about your discharge medications or the care you received while you were in the hospital after you are discharged, you can call the unit and ask to speak with the hospitalist on call if the hospitalist that took care of you is not available.  Once you are discharged, your primary care physician will handle any further medical issues. Please note that NO REFILLS for any discharge medications will be authorized once you are discharged, as it is imperative that you return to your primary care physician (or establish a relationship with a primary care physician if you do not have one) for your aftercare needs so that they can reassess your need for medications and monitor your lab values   Discharge wound care:   Complete by: As directed    Wet to Dry dressing daily, allow to demarcate further , follow-up with Dr. Chestine Spore with vascular in 2 weeks   Increase activity slowly   Complete by: As directed       Allergies as of 08/21/2023   No Known Allergies      Medication List     STOP taking these medications    clindamycin 300 MG capsule Commonly known as: CLEOCIN   predniSONE 10 MG tablet Commonly known as: DELTASONE       TAKE these medications    ALPRAZolam 0.5 MG tablet Commonly known as: XANAX Take 0.5 mg by mouth 3 (three) times daily as needed for anxiety.   amLODipine 10 MG tablet Commonly known  as: NORVASC Take 10 mg by mouth daily.   apixaban 5 MG Tabs tablet Commonly known as: ELIQUIS Take 1 tablet (5 mg total) by mouth 2 (two) times daily.   aspirin EC 81 MG tablet Take 1 tablet (81 mg total) by mouth daily. Swallow whole. Start taking on: August 22, 2023   atorvastatin 40 MG tablet Commonly known as: LIPITOR Take 1 tablet (40 mg total) by mouth daily. Start taking on: August 22, 2023   cyclobenzaprine 10 MG tablet Commonly known as: FLEXERIL Take 10 mg by mouth as needed for muscle spasms.   gabapentin 300 MG capsule Commonly known as: NEURONTIN Take 300 mg by mouth 2 (two) times daily.   HYDROcodone-acetaminophen 5-325 MG tablet Commonly known as: NORCO/VICODIN Take 1 tablet by mouth 3 (three) times daily as needed for moderate pain (pain score 4-6) or severe pain (pain score 7-10).   ibuprofen 200 MG tablet Commonly known as: ADVIL Take 600 mg by mouth as needed for mild pain (pain score 1-3) or  moderate pain (pain score 4-6).   traMADol 50 MG tablet Commonly known as: ULTRAM Take 50 mg by mouth as needed for moderate pain (pain score 4-6) or severe pain (pain score 7-10).   zolpidem 10 MG tablet Commonly known as: AMBIEN Take 10 mg by mouth at bedtime.               Discharge Care Instructions  (From admission, onward)           Start     Ordered   08/21/23 0000  Discharge wound care:       Comments: Wet to Dry dressing daily, allow to demarcate further , follow-up with Dr. Chestine Spore with vascular in 2 weeks   08/21/23 1004            Follow-up Information     [facility] Follow up.   Why: HHRN/PT/OT arranged- they will contact you to schedule Contact information: [address], [place], [place] [address]  Phone: [phone]        [facility]/home wound VAC Follow up.   Why: : If unit alarms or if experiencing issues to contact the home health, wound  care clinic, or physician's office that is managing the  V.A.C. Therapy.  If unsuccessful, call [facility] Technical Support at [phone], option 3.-Help with V.A.C. Therapy unit such as changing  full canister, charging the unit, therapy alarms, troubleshooting dressing applications or questions  regarding safety. Reach out to your health care provider managing your V.A.C. Therapy for any  questions directly related to your therapy Contact information: Important Resources and Contacts  General Questions: [phone] Order Supplies: [phone], option 2, then dial ext. [phone] 24/7 Technical Support: [phone], option 3        Ralene Ok, MD Follow up in 1 week(s).   Specialty: Internal Medicine Contact information: [address] [place] [address] [phone]                No Known Allergies  The results of significant diagnostics from this hospitalization (including imaging, microbiology, ancillary and laboratory) are listed below for reference.    Microbiology: Recent Results (from the past 240 hour(s))  Surgical PCR screen     Status: None   Collection Time: 08/13/23  3:52 AM   Specimen: Nasal Mucosa; Nasal Swab  Result Value Ref Range Status   MRSA, PCR NEGATIVE NEGATIVE Final   Staphylococcus aureus NEGATIVE NEGATIVE Final    Comment: (NOTE) The Xpert SA Assay (FDA approved for NASAL specimens in patients 27 years of age and older), is one component of a comprehensive surveillance program. It is not intended to diagnose infection nor to guide or monitor treatment. Performed at [facility], [address], [place], [place] [address]     Procedures/Studies: [place] CHEST PORT 1 VIEW  Result Date: 08/17/2023 CLINICAL DATA:  Hypoxia. EXAM: PORTABLE CHEST 1 VIEW COMPARISON:  08/15/2023. FINDINGS: The heart size and mediastinal contours are within normal limits. There is atherosclerotic calcification of the aorta. The pulmonary vasculature is prominent. Lung volumes are low with atelectasis at the  left lung base. No effusion or pneumothorax. No acute osseous abnormality is seen. IMPRESSION: 1. Mild pulmonary vascular congestion. 2. Low lung volumes with atelectasis at the left lung base. Electronically Signed   By: Thornell Sartorius M.D.   On: 08/17/2023 21:01   DG CHEST PORT 1 VIEW  Result Date: 08/15/2023 CLINICAL DATA:  Hypoxia. EXAM: PORTABLE CHEST 1 VIEW COMPARISON:  06/16/2020  FINDINGS: Poor inspiration with a grossly stable normal sized heart. Interval bibasilar linear density. The remainder of the lungs are clear with normal vascularity. Proximal left humerus fixation hardware. IMPRESSION: Poor inspiration with bibasilar subsegmental atelectasis. Electronically Signed   By: Beckie Salts M.D.   On: 08/15/2023 14:19   PERIPHERAL VASCULAR CATHETERIZATION  Result Date: 08/13/2023 DATE OF SERVICE: 08/10/2023  PATIENT:  [name]  67 y.o. male  PRE-OPERATIVE DIAGNOSIS:  Atherosclerosis of native arteries of left lower extremity causing ulceration  POST-OPERATIVE DIAGNOSIS:  Same  PROCEDURE:  1) Ultrasound guided right common femoral artery access 2) Aortogram 3) Left lower extremity angiogram with second order cannulation (95mL total contrast) 4) Conscious sedation (20 minutes)  SURGEON:  Rande Brunt. Lenell Antu, MD  ASSISTANT: none  ANESTHESIA:   local and IV sedation  ESTIMATED BLOOD LOSS: minimal  LOCAL MEDICATIONS USED:  LIDOCAINE  COUNTS: confirmed correct.  PATIENT DISPOSITION:  PACU - hemodynamically stable.  Delay start of Pharmacological VTE agent (>24hrs) due to surgical blood loss or risk of bleeding: no  INDICATION FOR PROCEDURE: SAHWN BODDEN is a 67 y.o. male with left foot ulceration and non-invasive evidence of peripheral arterial disease. After careful discussion of risks, benefits, and alternatives the patient was offered angiography. The patient understood and wished to proceed.  OPERATIVE FINDINGS: Renal arteries patent bilaterally. Infrarenal aorta patent Iliac arteries patent  bilaterally.  Highly tortuous  Right lower extremity: Common femoral artery: Patent Profunda femoris artery: Patent Superficial femoral artery: Occluded Popliteal artery: Occluded Anterior tibial artery:  reconstitutes just distal to origin, occludes in mid calf Tibioperoneal trunk: occludes Peroneal artery: reconstitutes just distal to origin, flows to ankle where it arborizes Posterior tibial artery: reconstitutes just distal to origin, occludes in mid calf  Left lower extremity: Common femoral artery: Patent Profunda femoris artery: Patent Superficial femoral artery: Occluded Popliteal artery: Occluded Anterior tibial artery: Reconstitutes at the ankle, and fills the dorsalis pedis, which fills the pedal circulation. Tibioperoneal trunk: Occluded Peroneal artery: Occluded Posterior tibial artery: Reconstitutes in the calf, close to the ankle where it occludes Pedal circulation: Fills via DP  DESCRIPTION OF PROCEDURE: After identification of the patient in the pre-operative holding area, the patient was transferred to the operating room. The patient was positioned supine on the operating room table.  Anesthesia was induced. The groins was prepped and draped in standard fashion. A surgical pause was performed confirming correct patient, procedure, and operative location.  The right groin was anesthetized with subcutaneous injection of 1% lidocaine. Using ultrasound guidance, the right common femoral artery was accessed with micropuncture technique. Fluoroscopy was used to confirm cannulation over the femoral head. The 33F sheath was upsized to 56F.  A Benson wire was advanced into the distal aorta. Over the wire an omni flush catheter was advanced to the level of L2. Aortogram was performed - see above for details. Bilateral lower extremity runoff angiography was performed - see above for details.  The left common iliac artery was selected with an omniflush catheter and benson guidewire. The wire was advanced into  the common femoral artery. Over the wire the omni flush catheter was advanced into the external iliac artery. Selective angiography was performed - see above for details.  The sheath was left in place to be removed in the recovery area.  Conscious sedation was administered with the use of IV fentanyl and midazolam under continuous physician and nurse monitoring.  Heart rate, blood pressure, and oxygen saturation were continuously monitored.  Total  sedation time was 20 minutes  Upon completion of the case instrument and sharps counts were confirmed correct. The patient was transferred to the PACU in good condition. I was present for all portions of the procedure.  PLAN: Left common femoral to distal anterior tibial artery bypass on Monday. Co-case with Dr. Chestine Spore. Vein mapping today.  Rande Brunt. Lenell Antu, MD [facility] Phone Number: [phone] 08/10/2023 10:48 AM   VAS [place] LOWER EXTREMITY SAPHENOUS VEIN MAPPING  Result Date: 08/11/2023 LOWER EXTREMITY VEIN MAPPING Patient Name:  JEREMIAH HASENAUER [facility]  Date of Exam:   08/10/2023 Medical Rec #: [id number]     Accession #:    [id number] Date of Birth: 06-Dec-1955      Patient Gender: M Patient Age:   28 years Exam Location:  [facility] Procedure:      VAS [place] LOWER EXTREMITY SAPHENOUS VEIN MAPPING Referring Phys: Heath Lark --------------------------------------------------------------------------------  History:            History of PAD; patient is pre-operative for lower extremity                     bypass graft. Risk Factors:       Hypertension, hyperlipidemia, coronary artery disease. Other Risk Factors: Obesity.  Performing Technologist: Marilynne Halsted RDMS, RVT  Examination Guidelines: A complete evaluation includes B-mode imaging, spectral Doppler, color Doppler, and power Doppler as needed of all accessible portions of each vessel. Bilateral testing is considered an integral part of a complete examination. Limited  examinations for reoccurring indications may be performed as noted. +---------------+-----------+----------------------+---------------+-----------+   RT Diameter  RT Findings         GSV            LT Diameter  LT Findings      (cm)                                            (cm)                  +---------------+-----------+----------------------+---------------+-----------+      0.65                     Saphenofemoral         0.84                                                   Junction                                  +---------------+-----------+----------------------+---------------+-----------+      0.43                     Proximal thigh         0.53                  +---------------+-----------+----------------------+---------------+-----------+      0.35                       Mid thigh            0.56  branching  +---------------+-----------+----------------------+---------------+-----------+      0.49       branching      Distal thigh          0.55                  +---------------+-----------+----------------------+---------------+-----------+      0.37                          Knee              0.50                  +---------------+-----------+----------------------+---------------+-----------+      0.46                       Prox calf            0.46                  +---------------+-----------+----------------------+---------------+-----------+      0.38                        Mid calf            0.44                  +---------------+-----------+----------------------+---------------+-----------+      0.37                      Distal calf           0.37                  +---------------+-----------+----------------------+---------------+-----------+      0.40                         Ankle              0.39                  +---------------+-----------+----------------------+---------------+-----------+ Diagnosing  physician: Coral Else MD Electronically signed by Coral Else MD on 08/11/2023 at 10:51:47 AM.    Final    CT HEAD WO CONTRAST ( )  Result Date: 08/09/2023 CLINICAL DATA:  Initial evaluation for acute head trauma, minor. Rate EXAM: CT HEAD WITHOUT CONTRAST TECHNIQUE: Contiguous axial images were obtained from the base of the skull through the vertex without intravenous contrast. RADIATION DOSE REDUCTION: This exam was performed according to the departmental dose-optimization program which includes automated exposure control, adjustment of the mA and/or kV according to patient size and/or use of iterative reconstruction technique. COMPARISON:  None Available. FINDINGS: Brain: Examination mildly degraded by motion artifact. Cerebral volume within normal limits. No acute intracranial hemorrhage. No acute large vessel territory infarct. No mass lesion, mass effect or midline shift. No hydrocephalus or extra-axial fluid collection. Vascular: No abnormal hyperdense vessel. Skull: No acute scalp soft tissue abnormality.  Calvarium intact. Sinuses/Orbits: Globes and orbital soft tissues within normal limits. Paranasal sinuses and mastoid air cells are largely clear. Other: None. IMPRESSION: Normal head CT.  No acute intracranial abnormality. Electronically Signed   By: Rise Mu M.D.   On: 08/09/2023 19:20   VAS [place] ABI WITH/WO TBI  Result Date: 08/08/2023  LOWER EXTREMITY DOPPLER STUDY Patient Name:  JUDEA FANG  Date of Exam:   08/08/2023 Medical Rec #: [id number]     Accession #:    [id number]  Date of Birth: 06-Mar-1956      Patient Gender: M Patient Age:   25 years Exam Location:  [facility] Procedure:      VAS [place] ABI WITH/WO TBI Referring Phys: PRANAV PATEL --------------------------------------------------------------------------------  Indications: Ulceration, and gangrene. High Risk Factors: Hypertension.  Limitations: Today's exam was limited due to an open wound, involuntary  patient              movement, bandages and patient intolerant to cuff pressure. Comparison Study: No prior studies. Performing Technologist: Olen Cordial RVT  Examination Guidelines: A complete evaluation includes at minimum, Doppler waveform signals and systolic blood pressure reading at the level of bilateral brachial, anterior tibial, and posterior tibial arteries, when vessel segments are accessible. Bilateral testing is considered an integral part of a complete examination. Photoelectric Plethysmograph (PPG) waveforms and toe systolic pressure readings are included as required and additional duplex testing as needed. Limited examinations for reoccurring indications may be performed as noted.  ABI Findings: +--------+------------------+-----+----------+--------+ Right   Rt Pressure (mmHg)IndexWaveform  Comment  +--------+------------------+-----+----------+--------+ [id number]                    triphasic          +--------+------------------+-----+----------+--------+ PTA     17                0.14 monophasic         +--------+------------------+-----+----------+--------+ DP      34                0.27 monophasic         +--------+------------------+-----+----------+--------+ +--------+------------------+-----+----------+-------+ Left    Lt Pressure (mmHg)IndexWaveform  Comment +--------+------------------+-----+----------+-------+ [id number]                    triphasic         +--------+------------------+-----+----------+-------+ PTA     36                0.29 monophasic        +--------+------------------+-----+----------+-------+ DP      28                0.22 monophasic        +--------+------------------+-----+----------+-------+ +-------+-----------+-----------+------------+------------+ ABI/TBIToday's ABIToday's TBIPrevious ABIPrevious TBI +-------+-----------+-----------+------------+------------+ Right  0.27                                            +-------+-----------+-----------+------------+------------+ Left   0.29                                           +-------+-----------+-----------+------------+------------+  Summary: Right: Resting right ankle-brachial index indicates severe right lower extremity arterial disease. Left: Resting left ankle-brachial index indicates severe left lower extremity arterial disease. *See table(s) above for measurements and observations.  Electronically signed by Sherald Hess MD on 08/08/2023 at 3:58:32 PM.    Final    VAS [place] LOWER EXTREMITY VENOUS (DVT) (ONLY [facility] & [facility])  Result Date: 08/08/2023  Lower Venous DVT Study Patient Name:  HIRVING ELLENBECKER [facility]  Date of Exam:   08/06/2023 Medical Rec #: [id number]     Accession #:    [id number] Date of Birth: 04-Aug-1956      Patient Gender: M Patient Age:   57 years Exam  Location:  [facility] Procedure:      VAS [place] LOWER EXTREMITY VENOUS (DVT) Referring Phys: MATTHEW TRIFAN --------------------------------------------------------------------------------  Indications: Pain.  Risk Factors: Surgery Esophagogastroduodenoscopy 07/27/20 obesity. Comparison Study: No prior study Performing Technologist: Shona Simpson  Examination Guidelines: A complete evaluation includes B-mode imaging, spectral Doppler, color Doppler, and power Doppler as needed of all accessible portions of each vessel. Bilateral testing is considered an integral part of a complete examination. Limited examinations for reoccurring indications may be performed as noted. The reflux portion of the exam is performed with the patient in reverse Trendelenburg.  +-----+---------------+---------+-----------+----------+--------------+ RIGHTCompressibilityPhasicitySpontaneityPropertiesThrombus Aging +-----+---------------+---------+-----------+----------+--------------+ CFV  Full           Yes      Yes                                  +-----+---------------+---------+-----------+----------+--------------+   +---------+---------------+---------+-----------+---------------+-------------+ LEFT     CompressibilityPhasicitySpontaneityProperties     Thrombus                                                                 Aging         +---------+---------------+---------+-----------+---------------+-------------+ CFV      Full           Yes      Yes                                     +---------+---------------+---------+-----------+---------------+-------------+ SFJ      Full                                                            +---------+---------------+---------+-----------+---------------+-------------+ FV Prox  Partial                 Yes        rigid          Acute                                                     w/compression                +---------+---------------+---------+-----------+---------------+-------------+ FV Mid   None                    No         brightly       Acute                                                     echogenic                    +---------+---------------+---------+-----------+---------------+-------------+ FV DistalNone  No         dilated        Acute         +---------+---------------+---------+-----------+---------------+-------------+ PFV      Full                    Yes                                     +---------+---------------+---------+-----------+---------------+-------------+ POP      None           No       No         brightly       Acute                                                     echogenic                    +---------+---------------+---------+-----------+---------------+-------------+ PTV      Partial                 Yes        rigid          Acute                                                     w/compression                 +---------+---------------+---------+-----------+---------------+-------------+ PERO     None                    No         dilated        Acute         +---------+---------------+---------+-----------+---------------+-------------+ Soleal   None                    No         rigid          Acute                                                     w/compression                +---------+---------------+---------+-----------+---------------+-------------+ Gastroc  None                    No         dilated        Acute         +---------+---------------+---------+-----------+---------------+-------------+ GSV      Full                                                            +---------+---------------+---------+-----------+---------------+-------------+ SSV  None                    No         rigid          Acute                                                     w/compression                +---------+---------------+---------+-----------+---------------+-------------+ 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    Summary: RIGHT: - No evidence of common femoral vein obstruction.   LEFT: - Findings consistent with acute deep vein thrombosis involving the left femoral vein, left popliteal vein, left posterior tibial veins, and left peroneal veins. - Findings consistent with acute superficial vein thrombosis involving the left small saphenous vein. Findings consistent with acute intramuscular thrombosis involving the left gastrocnemius veins, and left soleal veins. - No cystic structure found in the popliteal fossa.  *See table(s) above for measurements and observations. Electronically signed by Sherald Hess MD on 08/08/2023 at 1:09:42 PM.    Final    CT Hip Left Wo Contrast  Result Date: 08/06/2023 CLINICAL DATA:  History of falls.  Left leg pain. EXAM: CT OF THE LEFT HIP WITHOUT  CONTRAST TECHNIQUE: Multidetector CT imaging of the left hip was performed according to the standard protocol. Multiplanar CT image reconstructions were also generated. RADIATION DOSE REDUCTION: This exam was performed according to the departmental dose-optimization program which includes automated exposure control, adjustment of the mA and/or kV according to patient size and/or use of iterative reconstruction technique. COMPARISON:  CT abdomen/pelvis dated November 21, 2022. FINDINGS: Bones/Joint/Cartilage No acute osseous abnormality. The left femoral head is seated within the acetabulum. Degenerative changes of the left hip manifested by joint space narrowing superiorly and marginal osteophytosis. A tiny well corticated ossicle adjacent to the anterior superior acetabular rim may represent an os acetabula. No significant joint effusion identified. The pubic symphysis is anatomically aligned with degenerative changes. Ligaments Ligaments are suboptimally evaluated by CT. Muscles and Tendons No intramuscular fluid collection or hematoma. Soft tissue No fluid collection or hematoma. No enlarged lymph nodes identified in the field of view. IMPRESSION: 1. No acute osseous abnormality. 2. Osteoarthritis of the left hip. Electronically Signed   By: Hart Robinsons M.D.   On: 08/06/2023 14:52   DG HIP UNILAT WITH PELVIS 1V LEFT  Result Date: 08/06/2023 CLINICAL DATA:  Tenderness and swelling in left hip. EXAM: DG HIP (WITH OR WITHOUT PELVIS) 1V*L* COMPARISON:  None Available. FINDINGS: There is loss of definition of the cortex of the medial aspect of the right superior pubic ramus (marked with electronic arrow sign). This may be artifactual however, correlate clinically for point tenderness to determine the need for additional imaging with pelvic CT scan. Otherwise, no acute fracture or dislocation. No aggressive osseous lesion. Visualized sacral arcuate lines are unremarkable. There are changes of chronic pubic  symphisitis. There are mild degenerative changes of bilateral hip joints without significant joint space narrowing. Osteophytosis of the superior acetabulum. No radiopaque foreign bodies. IMPRESSION: *There is loss of definition of the cortex of the medial aspect of the right superior pubic ramus. This may be artifactual; however, correlate clinically for point tenderness to determine the  need for additional imaging with pelvic CT scan, which is more sensitive for detection of undisplaced fractures. Otherwise, no acute osseous abnormality. Electronically Signed   By: Jules Schick M.D.   On: 08/06/2023 10:26   DG Ankle Complete Left  Result Date: 08/06/2023 CLINICAL DATA:  Tenderness and swelling in left ankle. EXAM: LEFT ANKLE COMPLETE - 3+ VIEW COMPARISON:  None Available. FINDINGS: Bone mineralization within normal limits for patient's age. No acute fracture or dislocation. No aggressive osseous lesion. Ankle mortise appears intact.  No significant degenerative changes. Calcaneal spur noted along the Achilles tendon attachment site. No focal soft tissue swelling. No radiopaque foreign bodies. IMPRESSION: *No acute osseous abnormality of the left ankle joint. Electronically Signed   By: Jules Schick M.D.   On: 08/06/2023 10:21    Labs: BNP (last 3 results) Recent Labs    08/15/23 0431  BNP 70.8   Basic Metabolic Panel: Recent Labs  Lab 08/16/23 0344 08/16/23 1108 08/17/23 0424 08/18/23 0257 08/20/23 0321 08/21/23 0519  NA 139  --  138 139 138 139  K 3.3*  --  3.5 3.3* 3.8 3.8  CL 105  --  103 104 107 106  CO2 25  --  27 25 26 25   GLUCOSE 107*  --  137* 110* 107* 102*  BUN 20  --  17 14 16 14   CREATININE 1.64*  --  1.45* 1.46* 1.24 1.26*  CALCIUM 7.8*  --  7.3* 8.4* 8.1* 8.5*  MG  --  2.0  --   --   --   --    Liver Function Tests: Recent Labs  Lab 08/18/23 0257  AST 19  ALT 17  ALKPHOS 43  BILITOT 0.6  PROT 5.3*  ALBUMIN 1.9*   No results for input(s): "LIPASE",  "AMYLASE" in the last 168 hours. No results for input(s): "AMMONIA" in the last 168 hours. CBC: Recent Labs  Lab 08/17/23 0424 08/18/23 0257 08/19/23 0346 08/20/23 0321 08/21/23 0519  WBC 13.6* 11.7* 11.7* 14.3* 14.5*  HGB 11.4* 10.9* 11.5* 12.1* 12.1*  HCT 35.0* 34.9* 36.0* 37.1* 37.5*  MCV 85.8 85.3 85.9 86.5 86.0  PLT 394 420* 410* 428* 414*   Cardiac Enzymes: No results for input(s): "CKTOTAL", "CKMB", "CKMBINDEX", "TROPONINI" in the last 168 hours. BNP: Invalid input(s): "POCBNP" CBG: Recent Labs  Lab 08/19/23 0648  GLUCAP 112*   D-Dimer No results for input(s): "DDIMER" in the last 72 hours. Hgb A1c No results for input(s): "HGBA1C" in the last 72 hours. Lipid Profile No results for input(s): "CHOL", "HDL", "LDLCALC", "TRIG", "CHOLHDL", "LDLDIRECT" in the last 72 hours. Thyroid function studies No results for input(s): "TSH", "T4TOTAL", "T3FREE", "THYROIDAB" in the last 72 hours.  Invalid input(s): "FREET3" Anemia work up No results for input(s): "VITAMINB12", "FOLATE", "FERRITIN", "TIBC", "IRON", "RETICCTPCT" in the last 72 hours. Urinalysis    Component Value Date/Time   COLORURINE YELLOW 05/20/2017 2357   APPEARANCEUR CLEAR 05/20/2017 2357   LABSPEC 1.019 05/20/2017 2357   PHURINE 5.0 05/20/2017 2357   GLUCOSEU NEGATIVE 05/20/2017 2357   HGBUR NEGATIVE 05/20/2017 2357   BILIRUBINUR NEGATIVE 05/20/2017 2357   KETONESUR 20 (A) 05/20/2017 2357   PROTEINUR NEGATIVE 05/20/2017 2357   UROBILINOGEN 0.2 02/07/2008 0750   NITRITE NEGATIVE 05/20/2017 2357   LEUKOCYTESUR NEGATIVE 05/20/2017 2357   Sepsis Labs Recent Labs  Lab 08/18/23 0257 08/19/23 0346 08/20/23 0321 08/21/23 0519  WBC 11.7* 11.7* 14.3* 14.5*   Microbiology Recent Results (from the past 240 hour(s))  Surgical  PCR screen     Status: None   Collection Time: 08/13/23  3:52 AM   Specimen: Nasal Mucosa; Nasal Swab  Result Value Ref Range Status   MRSA, PCR NEGATIVE NEGATIVE Final    Staphylococcus aureus NEGATIVE NEGATIVE Final    Comment: (NOTE) The Xpert SA Assay (FDA approved for NASAL specimens in patients 95 years of age and older), is one component of a comprehensive surveillance program. It is not intended to diagnose infection nor to guide or monitor treatment. Performed at [facility], [address] [place], [place] [address]    Time coordinating discharge: 35 minutes  SIGNED: Lanae Boast, MD  [facility] 08/21/2023, 10:05 AM  If 7PM-7AM, please contact night-coverage [email or web address]

## 2023-08-21 NOTE — Progress Notes (Signed)
Occupational Therapy Treatment Patient Details Name: Tanner Phillips MRN: [id number] DOB: 09/06/1956 Today's Date: 08/21/2023   History of present illness Pt is a 66 yo male admitted 10/28 with worsening LE cellulitis and found to have LLE DVT. S/p LLE angiogram 11/01. Underwent L common femoral to ant tib bypass with L greater sapenous vein harvest and thrombectomy of anterior tib artery on 08/13/23. 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  PMH: ruptured disk in back, HTN, CAD s/p MI   OT comments  OT session focused on education of pt and his wife for increased safety with discharge home, including education in/demonstration of techniques for increased safety with ADLs and functional transfers, precautions and importance of wearing L darco shoe during functional transfers, and role of continued home health rehab services. However, pt presents with poor carryover of education regarding use of darco shoe and L LE WBAT with pt declining to donn L darco shoe and declining attempted stand-pivot or simulated vehicle transfer this session. Pt continues to largely require Set up to Contact guard assistance with UB ADLs and Mod to Max assist with LB ADLs. Pt is making slow progress toward OT goals. Plan for pt to discharge home with wife this day. If pt does not discharge as planned, pt will benefit from continued skilled OT services to address deficits outlined below, decrease caregiver burden, and increase safety and independence with functional tasks. Post acute discharge, OT recommends intensive inpatient skilled rehab services < 3 hours per day. to maximize rehab potential. However, pt declined intensive inpatient rehab services at this time. Due to this, pt will benefit from continued skilled OT services in the home to maximize rehab potential and for additional education of pt and family.       If plan is discharge home, recommend the  following:  Assistance with cooking/housework;Assist for transportation;Help with stairs or ramp for entrance;A lot of help with bathing/dressing/bathroom;A little help with walking and/or transfers   Equipment Recommendations  BSC/3in1    Recommendations for Other Services      Precautions / Restrictions Precautions Precautions: Fall Precaution Comments: wound vac L groin, dressing on L foot Required Braces or Orthoses: Other Brace Other Brace: darco shoe L foot Restrictions Weight Bearing Restrictions: Yes LLE Weight Bearing: Weight bearing as tolerated Other Position/Activity Restrictions: in darco shoe       Mobility Bed Mobility Overal bed mobility: Needs Assistance Bed Mobility: Supine to Sit, Sit to Supine, Rolling Rolling: Supervision, Modified independent (Device/Increase time)   Supine to sit: Supervision, HOB elevated, Used rails Sit to supine: Supervision, Used rails   General bed mobility comments: increased time and effort with trunk, used rails    Transfers Overall transfer level: Needs assistance Equipment used: Rolling walker (2 wheels) Transfers: Sit to/from Stand Sit to Stand: Min assist           General transfer comment: Attempted to address stand-pivot transfers with pt with pt declining and declining wearing L darco shoe during session. OT verbally educated and demonstrated technique for safe stand-pivot transfer with assist to pickup truck upon discharge with pt and wife verbally indicating understanding but unable to demonstrate through teach back. Pt and wife also needed significant repitition of training in need for pt to wear L darco shoe during transfers.     Balance Overall balance assessment: Needs assistance Sitting-balance support: Single extremity supported, No upper extremity supported, Feet supported Sitting balance-Leahy Scale: Good Sitting  balance - Comments: Pt with poor sitting tolerance due to L LE pain. Pt tolerating approx. 5  minutes of sitting EOB this session prior to requesting return to supine due to L LE pain.   Standing balance support: Bilateral upper extremity supported, During functional activity, Reliant on assistive device for balance Standing balance-Leahy Scale: Poor Standing balance comment: Pt with very limited standing tolerance due to pain with L LE WB and pt declining to donn L darco shoe.                           ADL either performed or assessed with clinical judgement   ADL Overall ADL's : Needs assistance/impaired                                       General ADL Comments: Pt declined ADLs this session due to working with NT earlier this morning and pt getting ready to discharge home. Pt largely continuing to complete UB ADLs with Set up to Contact guard assist and LB ADLs with Mod to Max assist.    Extremity/Trunk Assessment Upper Extremity Assessment Upper Extremity Assessment: Overall WFL for tasks assessed;Right hand dominant;RUE deficits/detail;LUE deficits/detail RUE Deficits / Details: pt with noted B UE mild fine and gross motor coordination deficits this day RUE Coordination: decreased fine motor;decreased gross motor (mild) LUE Deficits / Details: pt with noted B UE mild fine and gross motor coordination deficits this day LUE Coordination: decreased fine motor;decreased gross motor (mild)   Lower Extremity Assessment Lower Extremity Assessment: Defer to PT evaluation        Vision       Perception     Praxis      Cognition Arousal: Alert Behavior During Therapy: WFL for tasks assessed/performed, Impulsive, Anxious (Occasionally impulsive with movement) Overall Cognitive Status: Impaired/Different from baseline Area of Impairment: Safety/judgement, Awareness, Problem solving, Attention, Memory                   Current Attention Level: Selective Memory: Decreased short-term memory, Decreased recall of precautions    Safety/Judgement: Decreased awareness of safety, Decreased awareness of deficits Awareness: Emergent Problem Solving: Slow processing General Comments: Pt with poor insight into deficts and into importance/roll of skilled rehab services. Pt also with poor recall of need for darco shoe for L LE WBAT and poor anticipation of need to wear L darco shoe for transfer to wheelchair and planned transfer to truck at time of acute discharge. OT reeducated pt and his wife with both verbalizing but neither able to recall education regarding use of L darco shoe approx. 10 minutes later.        Exercises      Shoulder Instructions       General Comments OT also educated pt and his wife regarding the role of continued home health rehab services in the home with both reporting understanding. Pt's wife present throughout session. RN and nursing student present through a portion of session.    Pertinent Vitals/ Pain       Pain Assessment Pain Assessment: Faces Faces Pain Scale: Hurts even more Pain Location: L foot with mobility and in sitting with L foot on floor Pain Descriptors / Indicators: Grimacing, Guarding, Discomfort, Burning, Other (Comment), Operative site guarding Pain Intervention(s): Limited activity within patient's tolerance, Monitored during session, Repositioned  Home Living  Prior Functioning/Environment              Frequency  Min 1X/week        Progress Toward Goals  OT Goals(current goals can now be found in the care plan section)  Progress towards OT goals: Progressing toward goals (slowly progressing toward OT goals)  Acute Rehab OT Goals Patient Stated Goal: to return home  Plan      Co-evaluation                 AM-PAC OT "6 Clicks" Daily Activity     Outcome Measure   Help from another person eating meals?: None Help from another person taking care of personal grooming?: A Little Help  from another person toileting, which includes using toliet, bedpan, or urinal?: A Lot Help from another person bathing (including washing, rinsing, drying)?: A Lot Help from another person to put on and taking off regular upper body clothing?: A Little Help from another person to put on and taking off regular lower body clothing?: A Lot 6 Click Score: 16    End of Session    OT Visit Diagnosis: Other abnormalities of gait and mobility (R26.89);Other (comment);Pain (decreased activity tolerance)   Activity Tolerance Other (comment) (Pt with self limiting behaviors. Pt limited by cuurrent cognitive status.)   Patient Left in bed;with call bell/phone within reach;with nursing/sitter in room;with family/visitor present   Nurse Communication Mobility status;Other (comment) (Pt with order to wear L darco shoe for transfer to wheelchair and vehicle at discharge)        Time: 7026-3785 OT Time Calculation (min): 13 min  Charges: OT General Charges $OT Visit: 1 Visit OT Treatments $Self Care/Home Management : 8-22 mins  Tanner Phillips "Tanner Eva., OTR/L, MA Acute Rehab [phone]   Lendon Colonel 08/21/2023, 12:14 PM

## 2023-08-21 NOTE — Progress Notes (Addendum)
7:57 AM Progress Note    08/21/2023 7:57 AM 5 Days Post-Op  Subjective:  no major complaints. Would like to go home    Vitals:   08/21/23 0415 08/21/23 0730  BP: (!) 141/82 130/75  Pulse: 87 96  Resp: 19 18  Temp: 98.3 F (36.8 C) 98.1 F (36.7 C)  SpO2: 96% 93%   Physical Exam: Cardiac:  regular Lungs:  non labored Incisions:  Left leg incisions are all intact. Left groin prevena without seal. Prevena removed. Dry gauze applied Left foot wound well appearing, healthy tissue, minimal necrotic tissue along lateral 3rd toe. Wet to dry dressings and xeroform on dorsum of foot reapplied  Extremities:  left leg is well perfused and warm with palpable pulse in bypass graft, palpable DP Abdomen:  soft Neurologic: alert and oriented  CBC    Component Value Date/Time   WBC 14.5 (H) 08/21/2023 0519   RBC 4.36 08/21/2023 0519   HGB 12.1 (L) 08/21/2023 0519   HCT 37.5 (L) 08/21/2023 0519   PLT 414 (H) 08/21/2023 0519   MCV 86.0 08/21/2023 0519   MCH 27.8 08/21/2023 0519   MCHC 32.3 08/21/2023 0519   RDW 14.0 08/21/2023 0519   LYMPHSABS 1.2 08/06/2023 0927   MONOABS 1.3 (H) 08/06/2023 0927   EOSABS 0.1 08/06/2023 0927   BASOSABS 0.1 08/06/2023 0927    BMET    Component Value Date/Time   NA 139 08/21/2023 0519   K 3.8 08/21/2023 0519   CL 106 08/21/2023 0519   CO2 25 08/21/2023 0519   GLUCOSE 102 (H) 08/21/2023 0519   BUN 14 08/21/2023 0519   CREATININE 1.26 (H) 08/21/2023 0519   CALCIUM 8.5 (L) 08/21/2023 0519   GFRNONAA >60 08/21/2023 0519   GFRAA 52 (L) 05/21/2017 0010    INR    Component Value Date/Time   INR 1.6 (H) 02/16/2008 0525     Intake/Output Summary (Last 24 hours) at 08/21/2023 0757 Last data filed at 08/21/2023 3244 Gross per 24 hour  Intake 1197 ml  Output 2950 ml  Net -1753 ml     Assessment/Plan:  67 y.o. male is s/p  L femoral to ATA bypass with vein and subsequent 4,5 toe amp    Palpable Dp and pulse in bypass graft Left groin  prevena without seal, removed. Please apply dry gauze to groin daily to wick moisture Wet to dry dressing changes daily to left foot Left foot wound overall well appearing Pain control as needed PT/OT recommending SNF, however patient has a lot of family support with possibility of going to one of his daughters houses who is RN Okay from vascular standpoint for discharge Will arrange close interval follow up for wound check and suture removal in 2    Dory Horn [facility] [phone] 08/21/2023 7:57 AM  I have seen and evaluated the patient. I agree with the PA note as documented above.  Status post left common femoral to anterior tibial bypass at the ankle.  Still has a palpable DP pulse.  Required takeback for amputation of his left fourth and fifth toes after these demarcated.  Wet-to-dry dressing changed today.  Overall the wound looks pretty good.  Will allow this to demarcate further.  Needs wet-to-dry dressing changes daily.  We removed his Prevena VAC from the left groin and this looks good.  All of his other incisions look good.  Will arrange follow-up with wound check in about 2 weeks in the office as we discussed  with him today.  Aspirin statin for risk reduction.  Cephus Shelling, MD [facility] [place] Office: [phone]

## 2023-08-24 ENCOUNTER — Telehealth: Payer: Self-pay

## 2023-08-24 NOTE — Telephone Encounter (Signed)
Tanner Phillips, PT with [facility] called requesting verbal orders for continued therapy 2 wk x 2 and 1 wk x 5. He also needed clarification on WB status.  Verbal orders given, faxing last PT and OT notes from hospital clarifying WB status as tolerated with Darco shoe. Confirmed understanding.

## 2023-08-27 ENCOUNTER — Telehealth: Payer: Self-pay

## 2023-08-27 NOTE — Telephone Encounter (Signed)
Pt's wife, Tanner Phillips, called stating that the [facility] RN instructed her to call because the pt's wound may need some debridement.  Reviewed pt's chart, returned call for clarification, two identifiers used. She stated that there is no drainage, but some yellow slough on the wound that the RN was concerned with. Instructed her on how to upload a picture to MyChart. Sent a msg so she could reply to it with the picture.   Spoke with [employer], PA who reviewed the picture. She stated that the wound looked good and no need to change f/u appt.  Called pt's wife and reassured her that the pt didn't need to come in earlier unless there was gray/black tissue development or infection symptoms. Confirmed understanding.

## 2023-08-28 ENCOUNTER — Telehealth: Payer: Self-pay

## 2023-08-28 NOTE — Telephone Encounter (Signed)
Tanner Phillips [facility] nurse left VM on triage line that pt's wound is pretty much unchanged and we will keep next Tuesdays previously scheduled wound check appt. She states there is some slough, small amount of necrosis near toe, no odor/infection, and improvement in pain. APP saw picture he/wife sent yesterday. Returned her call and left Tanner Phillips a VM that we will keep his appt but to call us should anything change/worsen in the meantime.

## 2023-08-31 NOTE — Progress Notes (Unsigned)
  POST OPERATIVE OFFICE NOTE    CC:  F/u for surgery  HPI:  This is a 67 y.o. male who is s/p left CFA to ATA bypass with ipsilateral GSV, left AT thrombectomy and incisional vac to left groin on 08/13/2023 by Dr. Chestine Spore.   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 Hetty Blend  Pt returns today for follow up.  Pt states ***   No Known Allergies  Current Outpatient Medications  Medication Sig Dispense Refill   ALPRAZolam (XANAX) 0.5 MG tablet Take 0.5 mg by mouth 3 (three) times daily as needed for anxiety.     amLODipine (NORVASC) 10 MG tablet Take 10 mg by mouth daily.     apixaban (ELIQUIS) 5 MG TABS tablet Take 1 tablet (5 mg total) by mouth 2 (two) times daily. 60 tablet 0   aspirin EC 81 MG tablet Take 1 tablet (81 mg total) by mouth daily. Swallow whole. 30 tablet 0   atorvastatin (LIPITOR) 40 MG tablet Take 1 tablet (40 mg total) by mouth daily. 90 tablet 0   cyclobenzaprine (FLEXERIL) 10 MG tablet Take 10 mg by mouth 2 (two) times daily as needed for muscle spasms.     gabapentin (NEURONTIN) 300 MG capsule Take 300 mg by mouth 2 (two) times daily.     HYDROcodone-acetaminophen (NORCO/VICODIN) 5-325 MG tablet Take 1 tablet by mouth 3 (three) times daily as needed for moderate pain (pain score 4-6) or severe pain (pain score 7-10).     ibuprofen (ADVIL) 200 MG tablet Take 600 mg by mouth as needed for mild pain (pain score 1-3) or moderate pain (pain score 4-6).     traMADol (ULTRAM) 50 MG tablet Take 50 mg by mouth every 6 (six) hours as needed for moderate pain (pain score 4-6) or severe pain (pain score 7-10).     zolpidem (AMBIEN) 10 MG tablet Take 10 mg by mouth at bedtime.     No current facility-administered medications for this visit.     ROS:  See HPI  Physical Exam:  ***  Incision:  *** Extremities:  *** Neuro: *** Abdomen:  ***    Assessment/Plan:  This is a 67 y.o. male who is s/p: left CFA to ATA  bypass with ipsilateral GSV, left AT thrombectomy and incisional vac to left groin on 08/13/2023 by Dr. Chestine Spore.   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 Hetty Blend  -*** Continue asa/statin.  He is on Eliquis for DVT.   Doreatha Massed, [facility] [phone]   Clinic MD:  Chestine Spore

## 2023-09-04 ENCOUNTER — Encounter: Payer: Self-pay | Admitting: Physician Assistant

## 2023-09-04 ENCOUNTER — Ambulatory Visit (INDEPENDENT_AMBULATORY_CARE_PROVIDER_SITE_OTHER): Payer: Medicare Other | Admitting: Physician Assistant

## 2023-09-04 VITALS — BP 138/82 | HR 107 | Temp 98.0°F | Resp 18 | Ht 65.0 in | Wt 263.9 lb

## 2023-09-04 DIAGNOSIS — I739 Peripheral vascular disease, unspecified: Secondary | ICD-10-CM

## 2023-09-04 MED ORDER — DOXYCYCLINE HYCLATE 100 MG PO CAPS: 100.0000 mg | ORAL_CAPSULE | Freq: Two times a day (BID) | ORAL | 0 refills | Status: DC

## 2023-09-11 ENCOUNTER — Telehealth: Payer: Self-pay | Admitting: *Deleted

## 2023-09-11 ENCOUNTER — Ambulatory Visit (INDEPENDENT_AMBULATORY_CARE_PROVIDER_SITE_OTHER): Payer: Medicare Other | Admitting: Physician Assistant

## 2023-09-11 VITALS — BP 138/82 | HR 112 | Temp 98.7°F | Resp 18 | Ht 65.0 in | Wt 264.3 lb

## 2023-09-11 DIAGNOSIS — I739 Peripheral vascular disease, unspecified: Secondary | ICD-10-CM

## 2023-09-11 NOTE — Telephone Encounter (Signed)
Request HH PT verbal orders . Call verbal orders give. Tanner for [facility] PT stated [name] is starting to complain of right foot pain . Pt has appointment today at 1:15pm

## 2023-09-11 NOTE — Progress Notes (Signed)
POST OPERATIVE OFFICE NOTE    CC:  F/u for surgery  HPI:  This is a 67 y.o. male who is here for incision check.  He has a recent history of left foot ulcer due to chronic limb threatening ischemia.  Angiogram Findings : 08/10/23 Left lower extremity:             External iliac artery: patent Common femoral artery: Patent Profunda femoris artery: Patent Superficial femoral artery: Occluded Popliteal artery: Occluded Anterior tibial artery: Reconstitutes at the ankle, and fills the dorsalis pedis, which fills the pedal circulation. Tibioperoneal trunk: Occluded Peroneal artery: Occluded Posterior tibial artery: Reconstitutes in the calf, close to the ankle where it occludes Pedal circulation: Fills via DP 08/13/23 Dr. Chestine Spore performed  Procedure: 1.  Harvest of left leg great saphenous vein 2.  Left common femoral artery to anterior tibial artery bypass with ipsilateral nonreversed great saphenous vein tunneled through the lateral leg 3.  Left anterior tibial thrombectomy 4.  Incisional vac to left groin   On 08/16/23 Dr. Hetty Blend performed Left 4th and 5th toe amputation and forefoot debridement.     He was last seen in our office on 09/04/23 left foot wound with beefy red base, easily palpable left DP, and left heel with dry gangrene and malodorous.   The sutures were maintained.  He is back today for incision checks, wound checks and suture removal.       No Known Allergies  Current Outpatient Medications  Medication Sig Dispense Refill   ALPRAZolam (XANAX) 0.5 MG tablet Take 0.5 mg by mouth 3 (three) times daily as needed for anxiety.     amLODipine (NORVASC) 10 MG tablet Take 10 mg by mouth daily.     apixaban (ELIQUIS) 5 MG TABS tablet Take 1 tablet (5 mg total) by mouth 2 (two) times daily. 60 tablet 0   aspirin EC 81 MG tablet Take 1 tablet (81 mg total) by mouth daily. Swallow whole. 30 tablet 0   atorvastatin (LIPITOR) 40 MG tablet Take 1 tablet (40 mg total) by mouth  daily. 90 tablet 0   cyclobenzaprine (FLEXERIL) 10 MG tablet Take 10 mg by mouth 2 (two) times daily as needed for muscle spasms.     doxycycline (VIBRAMYCIN) 100 MG capsule Take 1 capsule (100 mg total) by mouth 2 (two) times daily. 20 capsule 0   gabapentin (NEURONTIN) 300 MG capsule Take 300 mg by mouth 2 (two) times daily.     HYDROcodone-acetaminophen (NORCO/VICODIN) 5-325 MG tablet Take 1 tablet by mouth 3 (three) times daily as needed for moderate pain (pain score 4-6) or severe pain (pain score 7-10).     ibuprofen (ADVIL) 200 MG tablet Take 600 mg by mouth as needed for mild pain (pain score 1-3) or moderate pain (pain score 4-6).     traMADol (ULTRAM) 50 MG tablet Take 50 mg by mouth every 6 (six) hours as needed for moderate pain (pain score 4-6) or severe pain (pain score 7-10).     zolpidem (AMBIEN) 10 MG tablet Take 10 mg by mouth at bedtime.     No current facility-administered medications for this visit.     ROS:  See HPI  Physical Exam:                Maintained palpable DP left LE.     Assessment/Plan:  This is a 67 y.o. male who is s/p:left CFA to ATA bypass with ipsilateral GSV, left AT thrombectomy and incisional vac to  left groin on 08/13/2023 by Dr. Chestine Spore.   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 Hetty Blend.     His residual toes are healing and have decreased dry gangrene.  The lateral foot wound is beefy red base. The leg incisions are healing slowly.  Sutures were removed.  They are likely residual pieces of nylon in the incision beds due to over growth of tissue.  Wet to dry dressing was placed over the lateral foot wound and dry guaze was placed over the incisions with light ace.  He states Dr. Chestine Spore wanted to wait a while and see if the heel wound improved.  If not he would plan a return to the OR for heel debridement.  Pending Dr. Ophelia Charter review of the pictures.     Mosetta Pigeon PA-C [facility] [phone]   Clinic MD:  Lenell Antu

## 2023-09-12 ENCOUNTER — Telehealth: Payer: Self-pay

## 2023-09-12 NOTE — Telephone Encounter (Signed)
Caller: Patient  Concern: R heel pain, 5th toe sores, pt concerned d/t the fact that these symptoms are the same as the L foot and he doesn't want any more amputations.  Location: R foot  Description:  noticed yesterday, worsened overnight  Consulted: Corrie, PA  Resolution:  Appointment on 12/17 moved earlier to 12/10  Next Appt: Appointment scheduled for 12/10 @ 1420 with MD

## 2023-09-14 ENCOUNTER — Telehealth: Payer: Self-pay

## 2023-09-14 ENCOUNTER — Observation Stay (HOSPITAL_COMMUNITY)
Admission: AD | Admit: 2023-09-14 | Discharge: 2023-09-15 | Disposition: A | Discharge status: Home / Self Care | Payer: Medicare Other | Source: Ambulatory Visit | Attending: *Deleted | Admitting: *Deleted

## 2023-09-14 ENCOUNTER — Encounter (HOSPITAL_COMMUNITY): Payer: Self-pay

## 2023-09-14 ENCOUNTER — Other Ambulatory Visit: Payer: Self-pay

## 2023-09-14 ENCOUNTER — Encounter (HOSPITAL_COMMUNITY): Payer: Self-pay | Admitting: Vascular Surgery

## 2023-09-14 DIAGNOSIS — T8189XA Other complications of procedures, not elsewhere classified, initial encounter: Secondary | ICD-10-CM | POA: Diagnosis present

## 2023-09-14 DIAGNOSIS — Z7902 Long term (current) use of antithrombotics/antiplatelets: Secondary | ICD-10-CM | POA: Insufficient documentation

## 2023-09-14 DIAGNOSIS — Z79899 Other long term (current) drug therapy: Secondary | ICD-10-CM | POA: Insufficient documentation

## 2023-09-14 DIAGNOSIS — Z89422 Acquired absence of other left toe(s): Secondary | ICD-10-CM | POA: Insufficient documentation

## 2023-09-14 DIAGNOSIS — Z7982 Long term (current) use of aspirin: Secondary | ICD-10-CM | POA: Diagnosis not present

## 2023-09-14 DIAGNOSIS — Z95820 Peripheral vascular angioplasty status with implants and grafts: Secondary | ICD-10-CM | POA: Insufficient documentation

## 2023-09-14 LAB — CBC
HCT: 38.8 % — ABNORMAL LOW (ref 39.0–52.0)
Hemoglobin: 12.6 g/dL — ABNORMAL LOW (ref 13.0–17.0)
MCH: 28 pg (ref 26.0–34.0)
MCHC: 32.5 g/dL (ref 30.0–36.0)
MCV: 86.2 fL (ref 80.0–100.0)
Platelets: 315 10*3/uL (ref 150–400)
RBC: 4.5 MIL/uL (ref 4.22–5.81)
RDW: 15 % (ref 11.5–15.5)
WBC: 10.8 10*3/uL — ABNORMAL HIGH (ref 4.0–10.5)
nRBC: 0 % (ref 0.0–0.2)

## 2023-09-14 LAB — BASIC METABOLIC PANEL
Anion gap: 13 (ref 5–15)
BUN: 16 mg/dL (ref 8–23)
CO2: 22 mmol/L (ref 22–32)
Calcium: 8.9 mg/dL (ref 8.9–10.3)
Chloride: 108 mmol/L (ref 98–111)
Creatinine, Ser: 1.55 mg/dL — ABNORMAL HIGH (ref 0.61–1.24)
GFR, Estimated: 48 mL/min — ABNORMAL LOW (ref >60)
Glucose, Bld: 117 mg/dL — ABNORMAL HIGH (ref 70–99)
Potassium: 3.1 mmol/L — ABNORMAL LOW (ref 3.5–5.1)
Sodium: 143 mmol/L (ref 135–145)

## 2023-09-14 MED ORDER — SODIUM CHLORIDE 0.9% FLUSH
3.0000 mL | Freq: Two times a day (BID) | INTRAVENOUS | Status: DC
  Administered 2023-09-15: 3 mL via INTRAVENOUS

## 2023-09-14 MED ORDER — PIPERACILLIN-TAZOBACTAM 3.375 G IVPB
3.3750 g | Freq: Three times a day (TID) | INTRAVENOUS | Status: DC
  Administered 2023-09-14 – 2023-09-15 (×2): 3.375 g via INTRAVENOUS
  Filled 2023-09-14 (×2): qty 50

## 2023-09-14 MED ORDER — GUAIFENESIN-DM 100-10 MG/5ML PO SYRP: 15.0000 mL | ORAL_SOLUTION | ORAL | Status: DC | PRN

## 2023-09-14 MED ORDER — HEPARIN SODIUM (PORCINE) 5000 UNIT/ML IJ SOLN
5000.0000 [IU] | Freq: Three times a day (TID) | INTRAMUSCULAR | Status: DC
  Administered 2023-09-14 – 2023-09-15 (×3): 5000 [IU] via SUBCUTANEOUS
  Filled 2023-09-14 (×3): qty 1

## 2023-09-14 MED ORDER — AMLODIPINE BESYLATE 10 MG PO TABS
10.0000 mg | ORAL_TABLET | Freq: Every day | ORAL | Status: DC
Start: 2023-09-15 — End: 2023-09-15
  Administered 2023-09-15: 10 mg via ORAL
  Filled 2023-09-14: qty 1

## 2023-09-14 MED ORDER — OXYCODONE-ACETAMINOPHEN 5-325 MG PO TABS: 1.0000 | ORAL_TABLET | ORAL | Status: DC | PRN

## 2023-09-14 MED ORDER — SODIUM CHLORIDE 0.9% FLUSH: 3.0000 mL | INTRAVENOUS | Status: DC | PRN

## 2023-09-14 MED ORDER — ASPIRIN 81 MG PO TBEC
81.0000 mg | DELAYED_RELEASE_TABLET | Freq: Every day | ORAL | Status: DC
  Administered 2023-09-15: 81 mg via ORAL
  Filled 2023-09-14: qty 1

## 2023-09-14 MED ORDER — PANTOPRAZOLE SODIUM 40 MG PO TBEC
40.0000 mg | DELAYED_RELEASE_TABLET | Freq: Every day | ORAL | Status: DC
  Administered 2023-09-15: 40 mg via ORAL
  Filled 2023-09-14: qty 1

## 2023-09-14 MED ORDER — ACETAMINOPHEN 325 MG PO TABS: 325.0000 mg | ORAL_TABLET | ORAL | Status: DC | PRN

## 2023-09-14 MED ORDER — POTASSIUM CHLORIDE CRYS ER 20 MEQ PO TBCR
20.0000 meq | EXTENDED_RELEASE_TABLET | Freq: Once | ORAL | Status: AC
  Administered 2023-09-14: 40 meq via ORAL
  Filled 2023-09-14: qty 2

## 2023-09-14 MED ORDER — POLYETHYLENE GLYCOL 3350 17 G PO PACK: 17.0000 g | PACK | Freq: Every day | ORAL | Status: DC | PRN

## 2023-09-14 MED ORDER — PHENOL 1.4 % MT LIQD: 1.0000 | OROMUCOSAL | Status: DC | PRN

## 2023-09-14 MED ORDER — LABETALOL HCL 5 MG/ML IV SOLN: 10.0000 mg | INTRAVENOUS | Status: DC | PRN

## 2023-09-14 MED ORDER — VANCOMYCIN HCL IN DEXTROSE 1-5 GM/200ML-% IV SOLN
1000.0000 mg | Freq: Once | INTRAVENOUS | Status: AC
  Administered 2023-09-14: 1000 mg via INTRAVENOUS
  Filled 2023-09-14: qty 200

## 2023-09-14 MED ORDER — HYDROCODONE-ACETAMINOPHEN 5-325 MG PO TABS: 1.0000 | ORAL_TABLET | Freq: Three times a day (TID) | ORAL | Status: DC | PRN

## 2023-09-14 MED ORDER — SODIUM CHLORIDE 0.9 % IV SOLN: 250.0000 mL | INTRAVENOUS | Status: DC | PRN

## 2023-09-14 MED ORDER — VANCOMYCIN HCL 1750 MG/350ML IV SOLN: 1750.0000 mg | INTRAVENOUS | Status: DC

## 2023-09-14 MED ORDER — HYDRALAZINE HCL 20 MG/ML IJ SOLN: 5.0000 mg | INTRAMUSCULAR | Status: DC | PRN

## 2023-09-14 MED ORDER — ONDANSETRON HCL 4 MG/2ML IJ SOLN: 4.0000 mg | Freq: Four times a day (QID) | INTRAMUSCULAR | Status: DC | PRN

## 2023-09-14 MED ORDER — ZOLPIDEM TARTRATE 5 MG PO TABS
10.0000 mg | ORAL_TABLET | Freq: Every evening | ORAL | Status: DC | PRN
  Administered 2023-09-14: 10 mg via ORAL
  Filled 2023-09-14: qty 2

## 2023-09-14 MED ORDER — VANCOMYCIN HCL 2000 MG/400ML IV SOLN
2000.0000 mg | Freq: Once | INTRAVENOUS | Status: DC
  Filled 2023-09-14: qty 400

## 2023-09-14 MED ORDER — ATORVASTATIN CALCIUM 40 MG PO TABS
40.0000 mg | ORAL_TABLET | Freq: Every day | ORAL | Status: DC
  Administered 2023-09-15: 40 mg via ORAL
  Filled 2023-09-14: qty 1

## 2023-09-14 MED ORDER — HYDROMORPHONE HCL 1 MG/ML IJ SOLN: 0.5000 mg | INTRAMUSCULAR | Status: DC | PRN

## 2023-09-14 MED ORDER — IBUPROFEN 200 MG PO TABS: 600.0000 mg | ORAL_TABLET | Freq: Four times a day (QID) | ORAL | Status: DC | PRN

## 2023-09-14 MED ORDER — BISACODYL 10 MG RE SUPP: 10.0000 mg | Freq: Every day | RECTAL | Status: DC | PRN

## 2023-09-14 MED ORDER — METOPROLOL TARTRATE 5 MG/5ML IV SOLN
2.0000 mg | INTRAVENOUS | Status: DC | PRN
Start: 2023-09-14 — End: 2023-09-15

## 2023-09-14 MED ORDER — GABAPENTIN 300 MG PO CAPS
300.0000 mg | ORAL_CAPSULE | Freq: Two times a day (BID) | ORAL | Status: DC
  Administered 2023-09-14 – 2023-09-15 (×2): 300 mg via ORAL
  Filled 2023-09-14 (×2): qty 1

## 2023-09-14 MED ORDER — ALPRAZOLAM 0.5 MG PO TABS: 0.5000 mg | ORAL_TABLET | Freq: Three times a day (TID) | ORAL | Status: DC | PRN

## 2023-09-14 MED ORDER — PIPERACILLIN-TAZOBACTAM 3.375 G IVPB 30 MIN
3.3750 g | Freq: Once | INTRAVENOUS | Status: AC
  Administered 2023-09-14: 3.375 g via INTRAVENOUS
  Filled 2023-09-14: qty 50

## 2023-09-14 MED ORDER — ACETAMINOPHEN 650 MG RE SUPP: 325.0000 mg | RECTAL | Status: DC | PRN

## 2023-09-14 MED ORDER — ALUM & MAG HYDROXIDE-SIMETH 200-200-20 MG/5ML PO SUSP: 15.0000 mL | ORAL | Status: DC | PRN

## 2023-09-14 NOTE — Progress Notes (Signed)
Pharmacy Antibiotic Note  Tanner Phillips is a 67 y.o. male admitted on 09/14/2023 with  possible wound infection .  Pharmacy has been consulted for Vancomycin and Zosyn dosing.  Low grade fever at home today, dehiscence and purulent drainage noted from anterior ankle wound after sutures removed 3 days ago. Recently completed 10-day course of Doxycycline. Hx peripheral bypass surgery 10/12/22 and left 4th and 5th toe amputations with foot debridement on 08/16/23.   Afebrile here. Blood cultures sent. Serum creatinine 1.55, ranged from 1.33-1.64 during November admit.  Plan: Zosyn 3.375 gm IV over 30 minutes x 1, then q8h (infused over 4 hours) Vancomycin 2gm IV loading dose, then 1750 mg IV q36hrs. Goal AUC 400-550. Expected AUC 538 using SCr 1.55 and Vd 0.5 (BMI 43.8) Bmet in am and at least q72h while on Vancomycin. Adjust Vanc regimen as needed Follow culture data, clinical progress and antibiotic plans.   Height: 5\' 5"  (165.1 cm) Weight: 119.7 kg (263 lb 14.3 oz) IBW/kg (Calculated) : 61.5  Temp (24hrs), Avg:98.5 F (36.9 C), Min:98.4 F (36.9 C), Max:98.5 F (36.9 C)  Recent Labs  Lab 09/14/23 1645  WBC 10.8*  CREATININE 1.55*    Estimated Creatinine Clearance: 53.9 mL/min (A) (by C-G formula based on SCr of 1.55 mg/dL (H)).    No Known Allergies  Antimicrobials this admission: Vancomycin 12/6 >> Zosyn 12/6 >>  Dose adjustments this admission: n/a  Microbiology results: 12/6 blood: sent  Thank you for allowing pharmacy to be a part of this patient's care.  Dennie Fetters, RPh 09/14/2023 10:10 PM

## 2023-09-14 NOTE — Plan of Care (Signed)

## 2023-09-14 NOTE — H&P (Signed)
H&P  MRN #:  [id number]  History of Present Illness: This is a 67 y.o. male who is status post left femoral to AT artery bypass with ipsilateral nonreversed GSV with Dr. Chestine Spore on 08/13/2023 and subsequently underwent left fourth and fifth toe amputations with foot debridement.  He called the office earlier today with low-grade fever, dehiscence and purulent drainage from his anterior ankle wound after having his sutures removed in clinic 3 days ago.  He and his wife report that the toes and the foot wound have been healing well.  He does still have a left heel ulcer and pain in the heel with pressure.  He recently finished his course of doxycycline.  He also reports a small wound between his right fourth and fifth toes.  Past Medical History:  Diagnosis Date   Chest pain    History of Bell's palsy    Knee pain    Myocardial infarction (HCC)    14-15 yrs. ago   Obesity    Slipped cervical disc    SOB (shortness of breath)     Past Surgical History:  Procedure Laterality Date   ABDOMINAL AORTOGRAM W/LOWER EXTREMITY Bilateral 08/10/2023   Procedure: ABDOMINAL AORTOGRAM W/LOWER EXTREMITY;  Surgeon: Leonie Douglas, MD;  Location: [facility] INVASIVE [facility];  Service: Cardiovascular;  Laterality: Bilateral;   AMPUTATION Left 08/16/2023   Procedure: LEFT FOURTH AND FIFTH TOE AMPUTATIONS WITH PLACEMENT OF WOUND VAC;  Surgeon: Daria Pastures, MD;  Location: [facility] OR;  Service: Vascular;  Laterality: Left;   APPLICATION OF WOUND VAC Left 08/13/2023   Procedure: APPLICATION OF WOUND VAC;  Surgeon: Cephus Shelling, MD;  Location: [facility] OR;  Service: Vascular;  Laterality: Left;   BACK SURGERY     LOWER   CHOLECYSTECTOMY     COLONOSCOPY WITH PROPOFOL N/A 06/03/2015   Procedure: COLONOSCOPY WITH PROPOFOL;  Surgeon: Charna Elizabeth, MD;  Location: [facility] ENDOSCOPY;  Service: Endoscopy;  Laterality: N/A;   COLONOSCOPY WITH PROPOFOL N/A 07/27/2020   Procedure: COLONOSCOPY WITH PROPOFOL;  Surgeon: Charna Elizabeth, MD;   Location: [facility] ENDOSCOPY;  Service: Endoscopy;  Laterality: N/A;   ESOPHAGOGASTRODUODENOSCOPY (EGD) WITH PROPOFOL N/A 07/27/2020   Procedure: ESOPHAGOGASTRODUODENOSCOPY (EGD) WITH PROPOFOL;  Surgeon: Charna Elizabeth, MD;  Location: [facility] ENDOSCOPY;  Service: Endoscopy;  Laterality: N/A;   FEMORAL-TIBIAL BYPASS GRAFT Left 08/13/2023   Procedure: LEFT COMMON FEMORAL-ANTERIOR TIBIAL ARTERY BYPASS;  Surgeon: Cephus Shelling, MD;  Location: [facility] OR;  Service: Vascular;  Laterality: Left;   Left Shoulder Surgery     Right Shoulder Surgery     THROMBECTOMY OF BYPASS GRAFT FEMORAL- POPLITEAL ARTERY Left 08/13/2023   Procedure: LEFT ANTERIOR TIBIAL THROMBECTOMY;  Surgeon: Cephus Shelling, MD;  Location: [facility] OR;  Service: Vascular;  Laterality: Left;   VEIN HARVEST Left 08/13/2023   Procedure: VEIN HARVEST OF GREATER SAPHEOUS VEIN;  Surgeon: Cephus Shelling, MD;  Location: [facility] OR;  Service: Vascular;  Laterality: Left;    No Known Allergies  Prior to Admission medications   Medication Sig Start Date End Date Taking? Authorizing Provider  ALPRAZolam Prudy Feeler) 0.5 MG tablet Take 0.5 mg by mouth 3 (three) times daily as needed for anxiety.    [provider]  amLODipine (NORVASC) 10 MG tablet Take 10 mg by mouth daily.    [provider]  apixaban (ELIQUIS) 5 MG TABS tablet Take 1 tablet (5 mg total) by mouth 2 (two) times daily. 08/21/23 09/20/23  Lanae Boast, MD  aspirin EC 81 MG  tablet Take 1 tablet (81 mg total) by mouth daily. Swallow whole. 08/22/23 09/21/23  Lanae Boast, MD  atorvastatin (LIPITOR) 40 MG tablet Take 1 tablet (40 mg total) by mouth daily. 08/22/23 11/20/23  Lanae Boast, MD  cyclobenzaprine (FLEXERIL) 10 MG tablet Take 10 mg by mouth 2 (two) times daily as needed for muscle spasms. 06/22/23   [provider]  doxycycline (VIBRAMYCIN) 100 MG capsule Take 1 capsule (100 mg total) by mouth 2 (two) times daily. 09/04/23   Rhyne, Ames Coupe, PA-C  gabapentin (NEURONTIN)  300 MG capsule Take 300 mg by mouth 2 (two) times daily.    [provider]  HYDROcodone-acetaminophen (NORCO/VICODIN) 5-325 MG tablet Take 1 tablet by mouth 3 (three) times daily as needed for moderate pain (pain score 4-6) or severe pain (pain score 7-10). 08/02/23   [provider]  ibuprofen (ADVIL) 200 MG tablet Take 600 mg by mouth as needed for mild pain (pain score 1-3) or moderate pain (pain score 4-6).    [provider]  traMADol (ULTRAM) 50 MG tablet Take 50 mg by mouth every 6 (six) hours as needed for moderate pain (pain score 4-6) or severe pain (pain score 7-10).    [provider]  zolpidem (AMBIEN) 10 MG tablet Take 10 mg by mouth at bedtime.    [provider]    Social History   Socioeconomic History   Marital status: Married    Spouse name: Not on file   Number of children: Not on file   Years of education: Not on file   Highest education level: Not on file  Occupational History   Not on file  Tobacco Use   Smoking status: Never   Smokeless tobacco: Never  Substance and Sexual Activity   Alcohol use: Yes    Comment: social   Drug use: No   Sexual activity: Not on file  Other Topics Concern   Not on file  Social History Narrative   MARRIED -WORKS AT [employer]   Social Determinants of Health   Financial Resource Strain: Not on file  Food Insecurity: No Food Insecurity (09/14/2023)   Hunger Vital Sign    Worried About Running Out of Food in the Last Year: Never true    Ran Out of Food in the Last Year: Never true  Transportation Needs: No Transportation Needs (09/14/2023)   PRAPARE - Administrator, Civil Service (Medical): No    Lack of Transportation (Non-Medical): No  Physical Activity: Not on file  Stress: Not on file  Social Connections: Not on file  Intimate Partner Violence: Not At Risk (09/14/2023)   Humiliation, Afraid, Rape, and Kick questionnaire    Fear of Current or Ex-Partner:  No    Emotionally Abused: No    Physically Abused: No    Sexually Abused: No    No family history on file.  ROS: Otherwise negative unless mentioned in HPI  Physical Examination  Vitals:   09/14/23 1525 09/14/23 1552  BP: (!) 118/53   Pulse: (!) 114 94  Resp: 16 14  Temp: 98.4 F (36.9 C)   SpO2: 97% 95%   Body mass index is 43.91 kg/m.  General: no acute distress Cardiac: hemodynamically stable, nontachycardic Pulm: normal work of breathing Neuro: alert, no focal deficit Extremities: Left anterior ankle wound with fibrinous exudate and granulation at base, bypass not visible.  Left leg saphenectomy site incisions scabbing over, without erythema or drainage. Small shallow wound between  his fourth and fifth toes on the right, no drainage or erythema. Vascular:   Right: Nonpalpable pedal pulses  Left: Palpable DP  ASSESSMENT/PLAN: This is a 67 y.o. male who is about 1 month status post left common femoral to AT artery bypass with ipsilateral nonreversed GSV who was brought into the hospital after a phone call to the office reporting dehiscence and purulent drainage of his left anterior ankle wound which is where his vein bypass is tunneled.  He also had a subjective fever.  The bypass is not visible in the wound although there is some fibrinous buildup but with granulation at the base. Plan to obtain labs, blood cultures and start IV antibiotics although does not appear that there is an obvious infection at this time.    Daria Pastures MD MS [facility] [phone] 09/14/2023  4:40 PM

## 2023-09-14 NOTE — Progress Notes (Signed)
[facility] [place]  Subjective  - States the anterior ankle incision separated and he had a low grade temp of 99.5 at home.  He denies chills.  New wound between right 4th/5th toes.     Objective (!) 118/53 94 98.4 F (36.9 C) (Oral) 14 95% No intake or output data in the 24 hours ending 09/14/23 1555  Left Anterior ankle incision without visible vein harvest.   Wet to dry dressing changes to it and the lateral foot wound. Leg incision healing well, scabs intact no abnormal erythema and no purulent drainage.  Pain in B heels left heel dry eschar   Assessment/Planning: 08/13/23 Dr. Chestine Spore performed  Procedure: 1.  Harvest of left leg great saphenous vein 2.  Left common femoral artery to anterior tibial artery bypass with ipsilateral nonreversed great saphenous vein tunneled through the lateral leg 3.  Left anterior tibial thrombectomy 4.  Incisional vac to left groin              On 08/16/23 Dr. Hetty Blend performed Left 4th and 5th toe amputation and forefoot debridement.      New right 4th/5th toe tissue loss wound.  Single vessel runoff peroneal to the ankle.  Angiogram of right LE 08/10/23  Right lower extremity: Common femoral artery: Patent Profunda femoris artery: Patent Superficial femoral artery: Occluded Popliteal artery: Occluded Anterior tibial artery:  reconstitutes just distal to origin, occludes in mid calf Tibioperoneal trunk: occludes Peroneal artery: reconstitutes just distal to origin, flows to ankle where it arborizes Posterior tibial artery: reconstitutes just distal to origin, occludes in mid calf   Pending labs, started IV antibiotics Zosyn and Vanc. Float heels off bed, dry guaze between the right 4th /5th toes.  Mosetta Pigeon 09/14/2023 3:55 PM --  Laboratory Lab Results: No results for input(s): "WBC", "HGB", "HCT", "PLT" in the last 72 hours. BMET No results for input(s): "NA", "K", "CL", "CO2", "GLUCOSE",  "BUN", "CREATININE", "CALCIUM" in the last 72 hours.  COAG Lab Results  Component Value Date   INR 1.6 (H) 02/16/2008   INR 1.5 02/15/2008   INR 1.4 02/14/2008   No results found for: "PTT"

## 2023-09-14 NOTE — Telephone Encounter (Signed)
Patient sent Mychart msg with picture of leg  Concern: incision dehiscence, purulent drainage, swelling  Location: left leg  Description:  noticed today at dressing change  Procedure: Vascular Bypass  Consulted: Audrie Lia and PA's  Resolution:  Direct admit to [facility]  Next Appt: awaiting bed placement for assignment

## 2023-09-15 ENCOUNTER — Other Ambulatory Visit (HOSPITAL_COMMUNITY): Payer: Self-pay

## 2023-09-15 DIAGNOSIS — T8189XA Other complications of procedures, not elsewhere classified, initial encounter: Secondary | ICD-10-CM | POA: Diagnosis not present

## 2023-09-15 LAB — BASIC METABOLIC PANEL
Anion gap: 9 (ref 5–15)
BUN: 15 mg/dL (ref 8–23)
CO2: 21 mmol/L — ABNORMAL LOW (ref 22–32)
Calcium: 8.4 mg/dL — ABNORMAL LOW (ref 8.9–10.3)
Chloride: 110 mmol/L (ref 98–111)
Creatinine, Ser: 1.56 mg/dL — ABNORMAL HIGH (ref 0.61–1.24)
GFR, Estimated: 48 mL/min — ABNORMAL LOW (ref >60)
Glucose, Bld: 111 mg/dL — ABNORMAL HIGH (ref 70–99)
Potassium: 3.4 mmol/L — ABNORMAL LOW (ref 3.5–5.1)
Sodium: 140 mmol/L (ref 135–145)

## 2023-09-15 MED ORDER — COLLAGENASE 250 UNIT/GM EX OINT: TOPICAL_OINTMENT | Freq: Every day | CUTANEOUS | 0 refills | Status: AC

## 2023-09-15 MED ORDER — COLLAGENASE 250 UNIT/GM EX OINT: TOPICAL_OINTMENT | Freq: Every day | CUTANEOUS | Status: DC

## 2023-09-15 MED ORDER — COLLAGENASE 250 UNIT/GM EX OINT
TOPICAL_OINTMENT | Freq: Every day | CUTANEOUS | Status: DC
  Filled 2023-09-15: qty 30

## 2023-09-15 NOTE — Care Management Obs Status (Signed)
MEDICARE OBSERVATION STATUS NOTIFICATION   Patient Details  Name: MARVENS BEAS MRN: [id number] Date of Birth: 07/03/1956   Medicare Observation Status Notification Given:  Yes    Ronny Bacon, RN 09/15/2023, 10:00 AM

## 2023-09-15 NOTE — Progress Notes (Addendum)
[facility] [place]  Subjective  - No new changes   Objective 125/70 80 97.9 F (36.6 C) (Oral) (!) 27 94%  Intake/Output Summary (Last 24 hours) at 09/15/2023 0824 Last data filed at 09/14/2023 1902 Gross per 24 hour  Intake 469.24 ml  Output --  Net 469.24 ml           Palpable DP Toes have improved with good cap refill Anterior ankle good granulation base, no purulence Heel stable without drainage Right 5th/4th toe tissue loss, painted with betadine and 2 x 2 placed between the toes.  Lungs non labored breathing   Assessment/Planning: This is a 67 y.o. male who is s/p:left CFA to ATA bypass with ipsilateral GSV, left AT thrombectomy and incisional vac to left groin on 08/13/2023 by Dr. Chestine Spore.   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 Hetty Blend.    Anterior ankle incision with superficial dehiscence, good granulation base.  No visibility of the vein bypass in the wound bed.  Wet to dry dressings started.    Wet to dry lateral foot wound , betadine paint to 4th/5th toe wound with dry guaze. F/U in a week with DR. Clark. Angiogram of right LE 08/10/23   Right lower extremity: Common femoral artery: Patent Profunda femoris artery: Patent Superficial femoral artery: Occluded Popliteal artery: Occluded Anterior tibial artery:  reconstitutes just distal to origin, occludes in mid calf Tibioperoneal trunk: occludes Peroneal artery: reconstitutes just distal to origin, flows to ankle where it arborizes Posterior tibial artery: reconstitutes just distal to origin, occludes in mid calf  He will discuss his right LE blood flow with Dr. Chestine Spore and plan of care.  Stable for discharge.   Mosetta Pigeon 09/15/2023 8:24 AM --  Laboratory Lab Results: Recent Labs    09/14/23 1645  WBC 10.8*  HGB 12.6*  HCT 38.8*  PLT 315   BMET Recent Labs    09/14/23 1645 09/15/23 0251   NA 143 140  K 3.1* 3.4*  CL 108 110  CO2 22 21*  GLUCOSE 117* 111*  BUN 16 15  CREATININE 1.55* 1.56*  CALCIUM 8.9 8.4*    COAG Lab Results  Component Value Date   INR 1.6 (H) 02/16/2008   INR 1.5 02/15/2008   INR 1.4 02/14/2008   No results found for: "PTT"  VASCULAR STAFF ADDENDUM: I have independently interviewed and examined the patient. I agree with the above.  No concerns for wound infection or exposed bypass in L ankle incision breakdown Ok for discharge home with santyl for fibrinous build up in L ankle wound and L groin saphenectomy incision Has follow up scheduled for Tuesday   Daria Pastures MD [facility] Phone Number: [phone] 09/15/2023 10:22 AM

## 2023-09-15 NOTE — Care Management CC44 (Signed)
Condition Code 44 Documentation Completed  Patient Details  Name: Tanner Phillips MRN: [id number] Date of Birth: 1956-03-11   Condition Code 44 given:  Yes Patient signature on Condition Code 44 notice:  Yes Documentation of 2 MD's agreement:  Yes Code 44 added to claim:  Yes    Ronny Bacon, RN 09/15/2023, 10:00 AM

## 2023-09-15 NOTE — TOC Transition Note (Signed)
Transition of Care [facility]) - CM/SW Discharge Note   Patient Details  Name: Tanner Phillips MRN: [id number] Date of Birth: 01-Mar-1956  Transition of Care [facility]) CM/SW Contact:  Ronny Bacon, RN Phone Number: 09/15/2023, 10:01 AM   Clinical Narrative:  Patient is being discharged today. Spoke with patient and wife by phone.      Final next level of care: Home/Self Care Barriers to Discharge: No Barriers Identified   Patient Goals and CMS Choice      Discharge Placement                         Discharge Plan and Services Additional resources added to the After Visit Summary for                                       Social Determinants of Health (SDOH) Interventions SDOH Screenings   Food Insecurity: No Food Insecurity (09/14/2023)  Housing: Patient Declined (09/14/2023)  Transportation Needs: No Transportation Needs (09/14/2023)  Utilities: Not At Risk (09/14/2023)  Tobacco Use: Low Risk  (09/14/2023)     Readmission Risk Interventions    08/21/2023    2:20 PM  Readmission Risk Prevention Plan  Post Dischage Appt Complete  Medication Screening Complete  Transportation Screening Complete

## 2023-09-17 NOTE — Discharge Summary (Signed)
[facility] Discharge Summary   Patient ID:  Tanner Phillips MRN: [id number] DOB/AGE: [birth year] 67 y.o.  Admit date: 09/14/2023 Discharge date: 09/15/23 Date of Surgery: * No surgery found * Surgeon: * Surgery not found *  Admission Diagnosis: Non-healing surgical wound [T81.89XA]  Discharge Diagnoses:  Non-healing surgical wound [T81.89XA]  Secondary Diagnoses: Past Medical History:  Diagnosis Date   Chest pain    History of Bell's palsy    Knee pain    Myocardial infarction (HCC)    14-15 yrs. ago   Obesity    Slipped cervical disc    SOB (shortness of breath)       Discharged Condition: stable  HPI: This is a 67 y.o. male who is status post left femoral to AT artery bypass with ipsilateral nonreversed GSV with Dr. Chestine Spore on 08/13/2023 and subsequently underwent left fourth and fifth toe amputations with foot debridement.  He called the office earlier today with low-grade fever, dehiscence and purulent drainage from his anterior ankle wound after having his sutures removed in clinic 3 days ago.  He and his wife report that the toes and the foot wound have been healing well.  He does still have a left heel ulcer and pain in the heel with pressure.  He recently finished his course of doxycycline.   He had sutures removed in our office on 09/11/23.  His wife called the office reporting dehiscence and purulent drainage of his left anterior ankle wound which is where his vein bypass is tunneled. He also had a subjective fever.    Hospital Course:  He was admitted blood work performed and IV antibiotics were started.     He remained afebrile and his  WBC was decreased compared to his hospitalization WBC from 14.3 now to 10.8.  The wound had a good base and there was no visibility of the bypass graft in the wound base.  Dr. Hetty Blend suggested he use santyl on the anterior ankle and the groin wound dehiscence daily.     They will continue wet to dry over the lateral  foot.   He was also started on antifungal powder to place over the yeast infection in the left groin area.  Of note he has a new right foot wound between the 4th and 5th toes.  This will be painted daily with betadine.    Right LE has sever PAD with occluded SFA, popliteal, and Tibioperoneal trunk.  Peroneal artery: reconstitutes just distal to origin, flows to ankle where it arborizes Posterior tibial artery: reconstitutes just distal to origin, occludes in mid calf  Plans will be for him to f/u with DR Chestine Spore next week.   Significant Diagnostic Studies: CBC Lab Results  Component Value Date   WBC 10.8 (H) 09/14/2023   HGB 12.6 (L) 09/14/2023   HCT 38.8 (L) 09/14/2023   MCV 86.2 09/14/2023   PLT 315 09/14/2023    BMET    Component Value Date/Time   NA 140 09/15/2023 0251   K 3.4 (L) 09/15/2023 0251   CL 110 09/15/2023 0251   CO2 21 (L) 09/15/2023 0251   GLUCOSE 111 (H) 09/15/2023 0251   BUN 15 09/15/2023 0251   CREATININE 1.56 (H) 09/15/2023 0251   CALCIUM 8.4 (L) 09/15/2023 0251   GFRNONAA 48 (L) 09/15/2023 0251   GFRAA 52 (L) 05/21/2017 0010   COAG Lab Results  Component Value Date   INR 1.6 (H) 02/16/2008   INR 1.5 02/15/2008   INR 1.4  02/14/2008     Disposition:  Discharge to :Home Discharge Instructions     Call MD for:  redness, tenderness, or signs of infection (pain, swelling, bleeding, redness, odor or green/yellow discharge around incision site)   Complete by: As directed    Call MD for:  redness, tenderness, or signs of infection (pain, swelling, bleeding, redness, odor or green/yellow discharge around incision site)   Complete by: As directed    Call MD for:  redness, tenderness, or signs of infection (pain, swelling, bleeding, redness, odor or green/yellow discharge around incision site)   Complete by: As directed    Call MD for:  severe or increased pain, loss or decreased feeling  in affected limb(s)   Complete by: As directed    Call MD for:   severe or increased pain, loss or decreased feeling  in affected limb(s)   Complete by: As directed    Call MD for:  severe or increased pain, loss or decreased feeling  in affected limb(s)   Complete by: As directed    Call MD for:  temperature >100.5   Complete by: As directed    Call MD for:  temperature >100.5   Complete by: As directed    Call MD for:  temperature >100.5   Complete by: As directed    Discharge instructions   Complete by: As directed    Wet to dry lateral left foot, anterior ankle.  Right 4th/5th   Resume previous diet   Complete by: As directed    Resume previous diet   Complete by: As directed    Resume previous diet   Complete by: As directed       Allergies as of 09/15/2023   No Known Allergies      Medication List     TAKE these medications    ALPRAZolam 0.5 MG tablet Commonly known as: XANAX Take 0.5 mg by mouth 3 (three) times daily as needed for anxiety.   amLODipine 10 MG tablet Commonly known as: NORVASC Take 10 mg by mouth daily.   aspirin EC 81 MG tablet Take 1 tablet (81 mg total) by mouth daily. Swallow whole.   atorvastatin 40 MG tablet Commonly known as: LIPITOR Take 1 tablet (40 mg total) by mouth daily.   collagenase 250 UNIT/GM ointment Commonly known as: SANTYL Apply topically daily.   cyclobenzaprine 10 MG tablet Commonly known as: FLEXERIL Take 10 mg by mouth 2 (two) times daily as needed for muscle spasms.   doxycycline 100 MG capsule Commonly known as: VIBRAMYCIN Take 1 capsule (100 mg total) by mouth 2 (two) times daily.   Eliquis 5 MG Tabs tablet Generic drug: apixaban Take 1 tablet (5 mg total) by mouth 2 (two) times daily.   gabapentin 300 MG capsule Commonly known as: NEURONTIN Take 300 mg by mouth 2 (two) times daily.   HYDROcodone-acetaminophen 5-325 MG tablet Commonly known as: NORCO/VICODIN Take 1 tablet by mouth 3 (three) times daily as needed for moderate pain (pain score 4-6) or severe pain  (pain score 7-10).   ibuprofen 200 MG tablet Commonly known as: ADVIL Take 600 mg by mouth as needed for mild pain (pain score 1-3) or moderate pain (pain score 4-6).   zolpidem 10 MG tablet Commonly known as: AMBIEN Take 10 mg by mouth at bedtime.       Verbal and written Discharge instructions given to the patient. Wound care per Discharge AVS  Follow-up Information     Cephus Shelling,  MD Follow up in 1 week(s).   Specialty: Vascular Surgery Why: Office will call you to arrange your appt (sent) Has appointment Contact information: [address] [place] [address] [phone]                 Signed: Mosetta Pigeon 09/17/2023, 9:17 AM

## 2023-09-18 ENCOUNTER — Ambulatory Visit: Payer: Medicare Other | Admitting: Vascular Surgery

## 2023-09-18 ENCOUNTER — Encounter: Payer: Self-pay | Admitting: Vascular Surgery

## 2023-09-18 VITALS — BP 124/68 | HR 110 | Temp 98.8°F | Resp 18 | Ht 65.0 in | Wt 266.9 lb

## 2023-09-18 DIAGNOSIS — I739 Peripheral vascular disease, unspecified: Secondary | ICD-10-CM

## 2023-09-18 DIAGNOSIS — I70223 Atherosclerosis of native arteries of extremities with rest pain, bilateral legs: Secondary | ICD-10-CM | POA: Diagnosis not present

## 2023-09-18 NOTE — Progress Notes (Signed)
Patient name: Tanner Phillips MRN: [id number] DOB: 1955-12-23 Sex: male  REASON FOR CONSULT: Triage, tissue loss right foot  HPI: Tanner Phillips is a 67 y.o. male, with history of MI, morbid obesity, and PAD that presents for triage visit for worsening tissue loss in the right foot.  He states he developed a wound on his right heel as well as between his right fourth and fifth toe over the last week.  He is well-known to [place] and has undergone revascularization in the left leg.  He underwent a left common femoral to AT bypass on 08/13/2023 for CLI with tissue loss.  He required left fourth and fifth toe amps on 08/16/2023.  Briefly admitted over the weekend with concern for wound infection but has been doing Santyl.  Still doing wet-to-dry to the left foot at the site of toe amputations.  Past Medical History:  Diagnosis Date   Chest pain    History of Bell's palsy    Knee pain    Myocardial infarction (HCC)    14-15 yrs. ago   Obesity    Slipped cervical disc    SOB (shortness of breath)     Past Surgical History:  Procedure Laterality Date   ABDOMINAL AORTOGRAM W/LOWER EXTREMITY Bilateral 08/10/2023   Procedure: ABDOMINAL AORTOGRAM W/LOWER EXTREMITY;  Surgeon: Leonie Douglas, MD;  Location: [facility];  Service: Cardiovascular;  Laterality: Bilateral;   AMPUTATION Left 08/16/2023   Procedure: LEFT FOURTH AND FIFTH TOE AMPUTATIONS WITH PLACEMENT OF WOUND VAC;  Surgeon: Daria Pastures, MD;  Location: [facility] OR;  Service: Vascular;  Laterality: Left;   APPLICATION OF WOUND VAC Left 08/13/2023   Procedure: APPLICATION OF WOUND VAC;  Surgeon: Cephus Shelling, MD;  Location: [facility] OR;  Service: Vascular;  Laterality: Left;   BACK SURGERY     LOWER   CHOLECYSTECTOMY     COLONOSCOPY WITH PROPOFOL N/A 06/03/2015   Procedure: COLONOSCOPY WITH PROPOFOL;  Surgeon: Charna Elizabeth, MD;  Location: [facility] ENDOSCOPY;  Service: Endoscopy;  Laterality: N/A;   COLONOSCOPY WITH PROPOFOL N/A 07/27/2020    Procedure: COLONOSCOPY WITH PROPOFOL;  Surgeon: Charna Elizabeth, MD;  Location: [facility] ENDOSCOPY;  Service: Endoscopy;  Laterality: N/A;   ESOPHAGOGASTRODUODENOSCOPY (EGD) WITH PROPOFOL N/A 07/27/2020   Procedure: ESOPHAGOGASTRODUODENOSCOPY (EGD) WITH PROPOFOL;  Surgeon: Charna Elizabeth, MD;  Location: [facility] ENDOSCOPY;  Service: Endoscopy;  Laterality: N/A;   FEMORAL-TIBIAL BYPASS GRAFT Left 08/13/2023   Procedure: LEFT COMMON FEMORAL-ANTERIOR TIBIAL ARTERY BYPASS;  Surgeon: Cephus Shelling, MD;  Location: [facility] OR;  Service: Vascular;  Laterality: Left;   Left Shoulder Surgery     Right Shoulder Surgery     THROMBECTOMY OF BYPASS GRAFT FEMORAL- POPLITEAL ARTERY Left 08/13/2023   Procedure: LEFT ANTERIOR TIBIAL THROMBECTOMY;  Surgeon: Cephus Shelling, MD;  Location[facility];  Service: Vascular;  Laterality: Left;   VEIN HARVEST Left 08/13/2023   Procedure: VEIN HARVEST OF GREATER SAPHEOUS VEIN;  Surgeon: Cephus Shelling, MD;  Location: [facility] OR;  Service: Vascular;  Laterality: Left;    History reviewed. No pertinent family history.  SOCIAL HISTORY: Social History   Socioeconomic History   Marital status: Married    Spouse name: Not on file   Number of children: Not on file   Years of education: Not on file   Highest education level: Not on file  Occupational History   Not on file  Tobacco Use   Smoking status: Never   Smokeless tobacco: Never  Substance and Sexual Activity   Alcohol use: Yes    Comment: social   Drug use: No   Sexual activity: Not on file  Other Topics Concern   Not on file  Social History Narrative   MARRIED -WORKS AT [employer]   Social Determinants of Health   Financial Resource Strain: Not on file  Food Insecurity: No Food Insecurity (09/14/2023)   Hunger Vital Sign    Worried About Running Out of Food in the Last Year: Never true    Ran Out of Food in the Last Year: Never true  Transportation Needs: No Transportation Needs (09/14/2023)   PRAPARE -  Administrator, Civil Service (Medical): No    Lack of Transportation (Non-Medical): No  Physical Activity: Not on file  Stress: Not on file  Social Connections: Not on file  Intimate Partner Violence: Not At Risk (09/14/2023)   Humiliation, Afraid, Rape, and Kick questionnaire    Fear of Current or Ex-Partner: No    Emotionally Abused: No    Physically Abused: No    Sexually Abused: No    No Known Allergies  Current Outpatient Medications  Medication Sig Dispense Refill   ALPRAZolam (XANAX) 0.5 MG tablet Take 0.5 mg by mouth 3 (three) times daily as needed for anxiety.     amLODipine (NORVASC) 10 MG tablet Take 10 mg by mouth daily.     apixaban (ELIQUIS) 5 MG TABS tablet Take 1 tablet (5 mg total) by mouth 2 (two) times daily. 60 tablet 0   aspirin EC 81 MG tablet Take 1 tablet (81 mg total) by mouth daily. Swallow whole. 30 tablet 0   atorvastatin (LIPITOR) 40 MG tablet Take 1 tablet (40 mg total) by mouth daily. 90 tablet 0   collagenase (SANTYL) 250 UNIT/GM ointment Apply topically daily. 30 g 0   cyclobenzaprine (FLEXERIL) 10 MG tablet Take 10 mg by mouth 2 (two) times daily as needed for muscle spasms.     doxycycline (VIBRAMYCIN) 100 MG capsule Take 1 capsule (100 mg total) by mouth 2 (two) times daily. 20 capsule 0   gabapentin (NEURONTIN) 300 MG capsule Take 300 mg by mouth 2 (two) times daily.     HYDROcodone-acetaminophen (NORCO/VICODIN) 5-325 MG tablet Take 1 tablet by mouth 3 (three) times daily as needed for moderate pain (pain score 4-6) or severe pain (pain score 7-10).     ibuprofen (ADVIL) 200 MG tablet Take 600 mg by mouth as needed for mild pain (pain score 1-3) or moderate pain (pain score 4-6).     zolpidem (AMBIEN) 10 MG tablet Take 10 mg by mouth at bedtime.     No current facility-administered medications for this visit.    REVIEW OF SYSTEMS:  [X]  denotes positive finding, [ ]  denotes negative finding Cardiac  Comments:  Chest pain or chest  pressure:    Shortness of breath upon exertion:    Short of breath when lying flat:    Irregular heart rhythm:        Vascular    Pain in calf, thigh, or hip brought on by ambulation:    Pain in feet at night that wakes you up from your sleep:     Blood clot in your veins:    Leg swelling:         Pulmonary    Oxygen at home:    Productive cough:     Wheezing:         Neurologic  Sudden weakness in arms or legs:     Sudden numbness in arms or legs:     Sudden onset of difficulty speaking or slurred speech:    Temporary loss of vision in one eye:     Problems with dizziness:         Gastrointestinal    Blood in stool:     Vomited blood:         Genitourinary    Burning when urinating:     Blood in urine:        Psychiatric    Major depression:         Hematologic    Bleeding problems:    Problems with blood clotting too easily:        Skin    Rashes or ulcers:        Constitutional    Fever or chills:      PHYSICAL EXAM: Vitals:   09/18/23 1436  BP: 124/68  Pulse: (!) 110  Resp: 18  Temp: 98.8 F (37.1 C)  TempSrc: Temporal  SpO2: 90%  Weight: 266 lb 14.4 oz (121.1 kg)  Height: 5\' 5"  (1.651 m)    GENERAL: The patient is a well-nourished male, in no acute distress. The vital signs are documented above. CARDIAC: There is a regular rate and rhythm.  VASCULAR:  Right femoral pulse palpable Ulcer on the right heel and between the right fourth and fifth toes as pictured Left DP palpable Left 4th 5th toe amputations open PULMONARY: No respiratory distress. ABDOMEN: Soft and non-tender. MUSCULOSKELETAL: There are no major deformities or cyanosis. NEUROLOGIC: No focal weakness or paresthesias are detected.        DATA:   ABIs on the right are 0.27 monophasic  Assessment/Plan:  67 y.o. male, with history of MI, morbid obesity, and PAD that presents for triage visit for worsening tissue loss in the right foot.  He states he developed a wound on  his right heel as well as between his right fourth and fifth toe.  He is well-known to [place] and has undergone revascularization in the left leg.  He underwent a left common femoral to AT bypass on 08/13/2023.  He required left fourth and fifth toe amps on 08/16/2023. Unfortunately now with new tissue loss on the right foot which is the other leg that has not undergone bypass.  Previously had very progressive tissue loss on the left.  He has a known right SFA flush occlusion with popliteal occlusion and multilevel occlusive disease.  I reviewed his angiogram images and with an ABI of 0.27 he has no chance of healing his right foot tissue loss.  Have recommended a right tibial bypass on Monday likely to a peroneal target.  He does appear to have good vein.  Risk benefits again discussed.  Again discussed he is very high risk for limb loss even with revascularization.    Cephus Shelling, MD [facility] [place] Office: [phone]

## 2023-09-19 ENCOUNTER — Other Ambulatory Visit: Payer: Self-pay

## 2023-09-19 DIAGNOSIS — I70223 Atherosclerosis of native arteries of extremities with rest pain, bilateral legs: Secondary | ICD-10-CM

## 2023-09-19 LAB — CULTURE, BLOOD (ROUTINE X 2)
Culture: NO GROWTH
Culture: NO GROWTH
Special Requests: ADEQUATE
Special Requests: ADEQUATE

## 2023-09-20 ENCOUNTER — Encounter (HOSPITAL_COMMUNITY): Payer: Self-pay | Admitting: Vascular Surgery

## 2023-09-20 ENCOUNTER — Other Ambulatory Visit: Payer: Self-pay

## 2023-09-20 NOTE — Progress Notes (Signed)
PCP - Dr. Ralene Ok Cardiologist - denies  PPM/ICD - denies   Chest x-ray - 08/17/23 EKG - DOS Stress Test - 03/05/07 ECHO - denies Cardiac Cath - denies  CPAP - nightly, pt unsure of pressure settings  DM- denies  Blood Thinner Instructions: pt was instructed to take last dose of Eliquis 12/12 Aspirin Instructions: continue DOS  ERAS Protcol - no, NPO  COVID TEST- n/a  Anesthesia review: no  Patient verbally denies any shortness of breath, fever, cough and chest pain during phone call   -------------  SDW INSTRUCTIONS given:  Your procedure is scheduled on 12/16.  Report to [facility] Main Entrance "A" at 0700 A.M., and check in at the Admitting office.  Call this number if you have problems the morning of surgery:  [phone]   Remember:  Do not eat or drink after midnight the night before your surgery     Take these medicines the morning of surgery with A SIP OF WATER  Xanax PRN Amlodipine ASA Atorvastatin Flexeril PRN Doxycycline Gabapentin Norco PRN  As of today, STOP taking any Aleve, Naproxen, Ibuprofen, Motrin, Advil, Goody's, BC's, all herbal medications, fish oil, and all vitamins.                      Do not wear jewelry, make up, or nail polish            Do not wear lotions, powders, perfumes/colognes, or deodorant.            Do not shave 48 hours prior to surgery.  Men may shave face and neck.            Do not bring valuables to the hospital.            [facility] is not responsible for any belongings or valuables.  Do NOT Smoke (Tobacco/Vaping) 24 hours prior to your procedure If you use a CPAP at night, you may bring all equipment for your overnight stay.   Contacts, glasses, dentures or bridgework may not be worn into surgery.      For patients admitted to the hospital, discharge time will be determined by your treatment team.   Patients discharged the day of surgery will not be allowed to drive home, and someone needs to stay  with them for 24 hours.    Special instructions:   [place]- Preparing For Surgery  Before surgery, you can play an important role. Because skin is not sterile, your skin needs to be as free of germs as possible. You can reduce the number of germs on your skin by washing with CHG (chlorahexidine gluconate) Soap before surgery.  CHG is an antiseptic cleaner which kills germs and bonds with the skin to continue killing germs even after washing.    Oral Hygiene is also important to reduce your risk of infection.  Remember - BRUSH YOUR TEETH THE MORNING OF SURGERY WITH YOUR REGULAR TOOTHPASTE  Please do not use if you have an allergy to CHG or antibacterial soaps. If your skin becomes reddened/irritated stop using the CHG.  Do not shave (including legs and underarms) for at least 48 hours prior to first CHG shower. It is OK to shave your face.  Please follow these instructions carefully.   Shower the NIGHT BEFORE SURGERY and the MORNING OF SURGERY with DIAL Soap.   Pat yourself dry with a CLEAN TOWEL.  Wear CLEAN PAJAMAS to bed the night before surgery  Place  CLEAN SHEETS on your bed the night of your first shower and DO NOT SLEEP WITH PETS.   Day of Surgery: Please shower morning of surgery  Wear Clean/Comfortable clothing the morning of surgery Do not apply any deodorants/lotions.   Remember to brush your teeth WITH YOUR REGULAR TOOTHPASTE.   Questions were answered. Patient verbalized understanding of instructions.

## 2023-09-21 ENCOUNTER — Other Ambulatory Visit: Payer: Self-pay | Admitting: Physician Assistant

## 2023-09-21 MED ORDER — ATORVASTATIN CALCIUM 40 MG PO TABS: 40.0000 mg | ORAL_TABLET | Freq: Every day | ORAL | 0 refills | Status: DC

## 2023-09-24 ENCOUNTER — Encounter (HOSPITAL_COMMUNITY): Payer: Self-pay | Admitting: Vascular Surgery

## 2023-09-24 ENCOUNTER — Inpatient Hospital Stay (HOSPITAL_COMMUNITY)
Admission: RE | Admit: 2023-09-24 | Discharge: 2023-10-01 | DRG: 253 | Disposition: A | Discharge status: Home Health | Payer: Medicare Other | Attending: Surgery | Admitting: Surgery

## 2023-09-24 ENCOUNTER — Inpatient Hospital Stay (HOSPITAL_COMMUNITY): Payer: Medicare Other | Admitting: Certified Registered"

## 2023-09-24 ENCOUNTER — Other Ambulatory Visit: Payer: Self-pay

## 2023-09-24 ENCOUNTER — Encounter (HOSPITAL_COMMUNITY)
Admission: RE | Disposition: A | Discharge status: Home Health | Payer: Self-pay | Source: Home / Self Care | Attending: Vascular Surgery

## 2023-09-24 DIAGNOSIS — Z86718 Personal history of other venous thrombosis and embolism: Secondary | ICD-10-CM

## 2023-09-24 DIAGNOSIS — I82451 Acute embolism and thrombosis of right peroneal vein: Secondary | ICD-10-CM | POA: Diagnosis present

## 2023-09-24 DIAGNOSIS — N179 Acute kidney failure, unspecified: Secondary | ICD-10-CM | POA: Diagnosis not present

## 2023-09-24 DIAGNOSIS — I70221 Atherosclerosis of native arteries of extremities with rest pain, right leg: Secondary | ICD-10-CM | POA: Diagnosis present

## 2023-09-24 DIAGNOSIS — Z7982 Long term (current) use of aspirin: Secondary | ICD-10-CM | POA: Diagnosis not present

## 2023-09-24 DIAGNOSIS — I252 Old myocardial infarction: Secondary | ICD-10-CM | POA: Diagnosis not present

## 2023-09-24 DIAGNOSIS — E66813 Obesity, class 3: Secondary | ICD-10-CM | POA: Diagnosis present

## 2023-09-24 DIAGNOSIS — Y838 Other surgical procedures as the cause of abnormal reaction of the patient, or of later complication, without mention of misadventure at the time of the procedure: Secondary | ICD-10-CM | POA: Diagnosis not present

## 2023-09-24 DIAGNOSIS — R32 Unspecified urinary incontinence: Secondary | ICD-10-CM | POA: Diagnosis not present

## 2023-09-24 DIAGNOSIS — I70223 Atherosclerosis of native arteries of extremities with rest pain, bilateral legs: Secondary | ICD-10-CM | POA: Diagnosis present

## 2023-09-24 DIAGNOSIS — N1831 Chronic kidney disease, stage 3a: Secondary | ICD-10-CM | POA: Diagnosis present

## 2023-09-24 DIAGNOSIS — Z89422 Acquired absence of other left toe(s): Secondary | ICD-10-CM | POA: Diagnosis not present

## 2023-09-24 DIAGNOSIS — Z95820 Peripheral vascular angioplasty status with implants and grafts: Secondary | ICD-10-CM

## 2023-09-24 DIAGNOSIS — Z7901 Long term (current) use of anticoagulants: Secondary | ICD-10-CM

## 2023-09-24 DIAGNOSIS — N5089 Other specified disorders of the male genital organs: Secondary | ICD-10-CM | POA: Diagnosis not present

## 2023-09-24 DIAGNOSIS — T8131XA Disruption of external operation (surgical) wound, not elsewhere classified, initial encounter: Secondary | ICD-10-CM | POA: Diagnosis not present

## 2023-09-24 DIAGNOSIS — I998 Other disorder of circulatory system: Secondary | ICD-10-CM

## 2023-09-24 DIAGNOSIS — L299 Pruritus, unspecified: Secondary | ICD-10-CM | POA: Diagnosis not present

## 2023-09-24 DIAGNOSIS — Z79899 Other long term (current) drug therapy: Secondary | ICD-10-CM

## 2023-09-24 DIAGNOSIS — I739 Peripheral vascular disease, unspecified: Secondary | ICD-10-CM | POA: Diagnosis present

## 2023-09-24 DIAGNOSIS — D62 Acute posthemorrhagic anemia: Secondary | ICD-10-CM | POA: Diagnosis not present

## 2023-09-24 HISTORY — PX: WOUND DEBRIDEMENT: SHX247

## 2023-09-24 HISTORY — PX: ENDARTERECTOMY TIBIOPERONEAL: SHX5808

## 2023-09-24 HISTORY — PX: FEMORAL-TIBIAL BYPASS GRAFT: SHX938

## 2023-09-24 HISTORY — DX: Essential (primary) hypertension: I10

## 2023-09-24 HISTORY — PX: VEIN HARVEST: SHX6363

## 2023-09-24 HISTORY — DX: Personal history of urinary calculi: Z87.442

## 2023-09-24 LAB — CBC
HCT: 39.9 % (ref 39.0–52.0)
Hemoglobin: 12.4 g/dL — ABNORMAL LOW (ref 13.0–17.0)
MCH: 27.6 pg (ref 26.0–34.0)
MCHC: 31.1 g/dL (ref 30.0–36.0)
MCV: 88.9 fL (ref 80.0–100.0)
Platelets: 337 10*3/uL (ref 150–400)
RBC: 4.49 MIL/uL (ref 4.22–5.81)
RDW: 14.8 % (ref 11.5–15.5)
WBC: 12.2 10*3/uL — ABNORMAL HIGH (ref 4.0–10.5)
nRBC: 0 % (ref 0.0–0.2)

## 2023-09-24 LAB — COMPREHENSIVE METABOLIC PANEL
ALT: 14 U/L (ref 0–44)
AST: 12 U/L — ABNORMAL LOW (ref 15–41)
Albumin: 3 g/dL — ABNORMAL LOW (ref 3.5–5.0)
Alkaline Phosphatase: 53 U/L (ref 38–126)
Anion gap: 10 (ref 5–15)
BUN: 16 mg/dL (ref 8–23)
CO2: 19 mmol/L — ABNORMAL LOW (ref 22–32)
Calcium: 8.6 mg/dL — ABNORMAL LOW (ref 8.9–10.3)
Chloride: 110 mmol/L (ref 98–111)
Creatinine, Ser: 1.57 mg/dL — ABNORMAL HIGH (ref 0.61–1.24)
GFR, Estimated: 47 mL/min — ABNORMAL LOW (ref >60)
Glucose, Bld: 110 mg/dL — ABNORMAL HIGH (ref 70–99)
Potassium: 3.7 mmol/L (ref 3.5–5.1)
Sodium: 139 mmol/L (ref 135–145)
Total Bilirubin: 0.7 mg/dL (ref <1.2)
Total Protein: 6.1 g/dL — ABNORMAL LOW (ref 6.5–8.1)

## 2023-09-24 LAB — URINALYSIS, ROUTINE W REFLEX MICROSCOPIC
Bilirubin Urine: NEGATIVE
Glucose, UA: NEGATIVE mg/dL
Ketones, ur: NEGATIVE mg/dL
Leukocytes,Ua: NEGATIVE
Nitrite: NEGATIVE
Protein, ur: 100 mg/dL — AB
RBC / HPF: >50 RBC/hpf (ref 0–5)
Specific Gravity, Urine: 1.01 (ref 1.005–1.030)
pH: 5 (ref 5.0–8.0)

## 2023-09-24 LAB — TYPE AND SCREEN
ABO/RH(D): A POS
Antibody Screen: NEGATIVE

## 2023-09-24 LAB — PROTIME-INR
INR: 1 (ref 0.8–1.2)
Prothrombin Time: 13.6 s (ref 11.4–15.2)

## 2023-09-24 LAB — SURGICAL PCR SCREEN
MRSA, PCR: NEGATIVE
Staphylococcus aureus: NEGATIVE

## 2023-09-24 LAB — APTT: aPTT: 23 s — ABNORMAL LOW (ref 24–36)

## 2023-09-24 SURGERY — CREATION, BYPASS, ARTERIAL, FEMORAL TO TIBIAL, USING GRAFT
Anesthesia: General | Site: Leg Upper | Laterality: Right

## 2023-09-24 MED ORDER — METOPROLOL TARTRATE 5 MG/5ML IV SOLN: 2.0000 mg | INTRAVENOUS | Status: DC | PRN

## 2023-09-24 MED ORDER — ACETAMINOPHEN 325 MG PO TABS
325.0000 mg | ORAL_TABLET | ORAL | Status: DC | PRN
  Administered 2023-09-28: 650 mg via ORAL
  Filled 2023-09-24: qty 2

## 2023-09-24 MED ORDER — IBUPROFEN 400 MG PO TABS: 600.0000 mg | ORAL_TABLET | ORAL | Status: DC | PRN

## 2023-09-24 MED ORDER — ALBUMIN HUMAN 5 % IV SOLN: INTRAVENOUS | Status: DC | PRN

## 2023-09-24 MED ORDER — MAGNESIUM SULFATE 2 GM/50ML IV SOLN: 2.0000 g | Freq: Every day | INTRAVENOUS | Status: DC | PRN

## 2023-09-24 MED ORDER — OXYCODONE HCL 5 MG PO TABS: 5.0000 mg | ORAL_TABLET | Freq: Once | ORAL | Status: DC | PRN

## 2023-09-24 MED ORDER — ATORVASTATIN CALCIUM 40 MG PO TABS
40.0000 mg | ORAL_TABLET | Freq: Every day | ORAL | Status: DC
  Administered 2023-09-24 – 2023-09-25 (×2): 40 mg
  Filled 2023-09-24 (×2): qty 1

## 2023-09-24 MED ORDER — ONDANSETRON HCL 4 MG/2ML IJ SOLN
INTRAMUSCULAR | Status: AC
  Filled 2023-09-24: qty 2

## 2023-09-24 MED ORDER — PHENYLEPHRINE 80 MCG/ML (10ML) SYRINGE FOR IV PUSH (FOR BLOOD PRESSURE SUPPORT)
PREFILLED_SYRINGE | INTRAVENOUS | Status: AC
  Filled 2023-09-24: qty 10

## 2023-09-24 MED ORDER — ROCURONIUM BROMIDE 10 MG/ML (PF) SYRINGE
PREFILLED_SYRINGE | INTRAVENOUS | Status: AC
  Filled 2023-09-24: qty 10

## 2023-09-24 MED ORDER — ZOLPIDEM TARTRATE 5 MG PO TABS
10.0000 mg | ORAL_TABLET | Freq: Every day | ORAL | Status: DC
  Administered 2023-09-24: 10 mg
  Filled 2023-09-24: qty 2

## 2023-09-24 MED ORDER — ONDANSETRON HCL 4 MG/2ML IJ SOLN
4.0000 mg | Freq: Four times a day (QID) | INTRAMUSCULAR | Status: DC | PRN
Start: 2023-09-24 — End: 2023-10-01
  Administered 2023-09-27 – 2023-09-28 (×3): 4 mg via INTRAVENOUS
  Filled 2023-09-24 (×3): qty 2

## 2023-09-24 MED ORDER — HYDROMORPHONE HCL 1 MG/ML IJ SOLN
0.5000 mg | INTRAMUSCULAR | Status: DC | PRN
  Administered 2023-09-24 – 2023-09-26 (×12): 1 mg via INTRAVENOUS
  Filled 2023-09-24 (×12): qty 1

## 2023-09-24 MED ORDER — AMLODIPINE BESYLATE 10 MG PO TABS
10.0000 mg | ORAL_TABLET | Freq: Every day | ORAL | Status: DC
  Administered 2023-09-24 – 2023-09-25 (×2): 10 mg
  Filled 2023-09-24 (×2): qty 1

## 2023-09-24 MED ORDER — PROPOFOL 10 MG/ML IV BOLUS
INTRAVENOUS | Status: AC
  Filled 2023-09-24: qty 20

## 2023-09-24 MED ORDER — PROTAMINE SULFATE 10 MG/ML IV SOLN
INTRAVENOUS | Status: DC | PRN
  Administered 2023-09-24 (×5): 5 mg via INTRAVENOUS
  Administered 2023-09-24: 10 mg via INTRAVENOUS
  Administered 2023-09-24: 5 mg via INTRAVENOUS
  Administered 2023-09-24: 10 mg via INTRAVENOUS

## 2023-09-24 MED ORDER — HYDROMORPHONE HCL 1 MG/ML IJ SOLN
INTRAMUSCULAR | Status: DC | PRN
  Administered 2023-09-24 (×2): .5 mg via INTRAVENOUS

## 2023-09-24 MED ORDER — SUGAMMADEX SODIUM 200 MG/2ML IV SOLN
INTRAVENOUS | Status: DC | PRN
  Administered 2023-09-24 (×2): 200 mg via INTRAVENOUS

## 2023-09-24 MED ORDER — DOXYCYCLINE HYCLATE 100 MG PO TABS
100.0000 mg | ORAL_TABLET | Freq: Two times a day (BID) | ORAL | Status: DC
Start: 2023-09-24 — End: 2023-09-25
  Administered 2023-09-24 – 2023-09-25 (×2): 100 mg
  Filled 2023-09-24 (×2): qty 1

## 2023-09-24 MED ORDER — SODIUM CHLORIDE 0.9 % IV SOLN: INTRAVENOUS | Status: AC

## 2023-09-24 MED ORDER — PHENYLEPHRINE HCL-NACL 20-0.9 MG/250ML-% IV SOLN
INTRAVENOUS | Status: DC | PRN
  Administered 2023-09-24: 15 ug/min via INTRAVENOUS

## 2023-09-24 MED ORDER — HYDROCODONE-ACETAMINOPHEN 5-325 MG PO TABS
1.0000 | ORAL_TABLET | ORAL | Status: DC | PRN
  Administered 2023-09-24 – 2023-09-25 (×4): 2
  Filled 2023-09-24 (×4): qty 2

## 2023-09-24 MED ORDER — DOXYCYCLINE HYCLATE 100 MG PO TABS
100.0000 mg | ORAL_TABLET | Freq: Two times a day (BID) | ORAL | Status: DC
  Filled 2023-09-24: qty 1

## 2023-09-24 MED ORDER — CHLORHEXIDINE GLUCONATE CLOTH 2 % EX PADS: 6.0000 | MEDICATED_PAD | Freq: Once | CUTANEOUS | Status: DC

## 2023-09-24 MED ORDER — VASOPRESSIN 20 UNIT/ML IV SOLN
INTRAVENOUS | Status: AC
  Filled 2023-09-24: qty 1

## 2023-09-24 MED ORDER — SODIUM CHLORIDE 0.9 % IV SOLN: 500.0000 mL | Freq: Once | INTRAVENOUS | Status: DC | PRN

## 2023-09-24 MED ORDER — PHENYLEPHRINE HCL-NACL 20-0.9 MG/250ML-% IV SOLN
INTRAVENOUS | Status: AC
  Filled 2023-09-24: qty 250

## 2023-09-24 MED ORDER — HYDROMORPHONE HCL 1 MG/ML IJ SOLN
INTRAMUSCULAR | Status: AC
  Filled 2023-09-24: qty 0.5

## 2023-09-24 MED ORDER — DOCUSATE SODIUM 100 MG PO CAPS
100.0000 mg | ORAL_CAPSULE | Freq: Every day | ORAL | Status: DC
  Administered 2023-09-25 – 2023-10-01 (×7): 100 mg via ORAL
  Filled 2023-09-24 (×7): qty 1

## 2023-09-24 MED ORDER — HEPARIN SODIUM (PORCINE) 1000 UNIT/ML IJ SOLN
INTRAMUSCULAR | Status: DC | PRN
  Administered 2023-09-24: 4000 [IU] via INTRAVENOUS
  Administered 2023-09-24: 11000 [IU] via INTRAVENOUS
  Administered 2023-09-24: 2000 [IU] via INTRAVENOUS
  Administered 2023-09-24: 4000 [IU] via INTRAVENOUS

## 2023-09-24 MED ORDER — FENTANYL CITRATE (PF) 250 MCG/5ML IJ SOLN
INTRAMUSCULAR | Status: AC
  Filled 2023-09-24: qty 5

## 2023-09-24 MED ORDER — CEFAZOLIN SODIUM-DEXTROSE 2-4 GM/100ML-% IV SOLN
2.0000 g | Freq: Three times a day (TID) | INTRAVENOUS | Status: AC
  Administered 2023-09-24 – 2023-09-25 (×2): 2 g via INTRAVENOUS
  Filled 2023-09-24 (×2): qty 100

## 2023-09-24 MED ORDER — DEXAMETHASONE SODIUM PHOSPHATE 10 MG/ML IJ SOLN
INTRAMUSCULAR | Status: AC
  Filled 2023-09-24: qty 1

## 2023-09-24 MED ORDER — POTASSIUM CHLORIDE CRYS ER 20 MEQ PO TBCR
20.0000 meq | EXTENDED_RELEASE_TABLET | Freq: Every day | ORAL | Status: DC | PRN
Start: 2023-09-24 — End: 2023-10-01

## 2023-09-24 MED ORDER — PHENOL 1.4 % MT LIQD: 1.0000 | OROMUCOSAL | Status: DC | PRN

## 2023-09-24 MED ORDER — GUAIFENESIN-DM 100-10 MG/5ML PO SYRP: 15.0000 mL | ORAL_SOLUTION | ORAL | Status: DC | PRN

## 2023-09-24 MED ORDER — VASOPRESSIN 20 UNIT/ML IV SOLN
INTRAVENOUS | Status: DC | PRN
  Administered 2023-09-24 (×4): .25 [IU] via INTRAVENOUS

## 2023-09-24 MED ORDER — LACTATED RINGERS IV SOLN: Freq: Once | INTRAVENOUS | Status: AC

## 2023-09-24 MED ORDER — HEPARIN (PORCINE) 25000 UT/250ML-% IV SOLN
INTRAVENOUS | Status: AC
  Filled 2023-09-24: qty 250

## 2023-09-24 MED ORDER — MIDAZOLAM HCL 2 MG/2ML IJ SOLN
INTRAMUSCULAR | Status: DC | PRN
  Administered 2023-09-24: 1 mg via INTRAVENOUS

## 2023-09-24 MED ORDER — ROCURONIUM BROMIDE 10 MG/ML (PF) SYRINGE
PREFILLED_SYRINGE | INTRAVENOUS | Status: DC | PRN
  Administered 2023-09-24: 50 mg via INTRAVENOUS
  Administered 2023-09-24: 20 mg via INTRAVENOUS
  Administered 2023-09-24: 10 mg via INTRAVENOUS
  Administered 2023-09-24 (×2): 15 mg via INTRAVENOUS
  Administered 2023-09-24: 20 mg via INTRAVENOUS
  Administered 2023-09-24: 5 mg via INTRAVENOUS

## 2023-09-24 MED ORDER — HEPARIN 6000 UNIT IRRIGATION SOLUTION
Status: DC | PRN
  Administered 2023-09-24: 1

## 2023-09-24 MED ORDER — PROPOFOL 10 MG/ML IV BOLUS
INTRAVENOUS | Status: DC | PRN
  Administered 2023-09-24: 100 mg via INTRAVENOUS
  Administered 2023-09-24: 50 mg via INTRAVENOUS

## 2023-09-24 MED ORDER — ASPIRIN 325 MG PO TBEC
325.0000 mg | DELAYED_RELEASE_TABLET | Freq: Every day | ORAL | Status: DC
  Administered 2023-09-25 – 2023-10-01 (×7): 325 mg via ORAL
  Filled 2023-09-24 (×7): qty 1

## 2023-09-24 MED ORDER — OXYCODONE HCL 5 MG/5ML PO SOLN
5.0000 mg | Freq: Once | ORAL | Status: DC | PRN
Start: 2023-09-24 — End: 2023-09-24

## 2023-09-24 MED ORDER — DEXAMETHASONE SODIUM PHOSPHATE 10 MG/ML IJ SOLN
INTRAMUSCULAR | Status: DC | PRN
  Administered 2023-09-24: 4 mg via INTRAVENOUS

## 2023-09-24 MED ORDER — ACETAMINOPHEN 650 MG RE SUPP: 325.0000 mg | RECTAL | Status: DC | PRN

## 2023-09-24 MED ORDER — FENTANYL CITRATE (PF) 100 MCG/2ML IJ SOLN
INTRAMUSCULAR | Status: AC
  Filled 2023-09-24: qty 2

## 2023-09-24 MED ORDER — ALUM & MAG HYDROXIDE-SIMETH 200-200-20 MG/5ML PO SUSP: 15.0000 mL | ORAL | Status: DC | PRN

## 2023-09-24 MED ORDER — BISACODYL 5 MG PO TBEC
5.0000 mg | DELAYED_RELEASE_TABLET | Freq: Every day | ORAL | Status: DC | PRN
  Administered 2023-09-27: 5 mg via ORAL
  Filled 2023-09-24: qty 1

## 2023-09-24 MED ORDER — EPHEDRINE 5 MG/ML INJ
INTRAVENOUS | Status: AC
  Filled 2023-09-24: qty 5

## 2023-09-24 MED ORDER — HEPARIN (PORCINE) 25000 UT/250ML-% IV SOLN
500.0000 [IU]/h | INTRAVENOUS | Status: DC
  Administered 2023-09-24 – 2023-09-26 (×2): 500 [IU]/h via INTRAVENOUS
  Filled 2023-09-24: qty 250

## 2023-09-24 MED ORDER — ONDANSETRON HCL 4 MG/2ML IJ SOLN
INTRAMUSCULAR | Status: DC | PRN
  Administered 2023-09-24: 4 mg via INTRAVENOUS

## 2023-09-24 MED ORDER — SODIUM CHLORIDE 0.9 % IV SOLN
3.0000 g | INTRAVENOUS | Status: AC
  Administered 2023-09-24 (×2): 3 g via INTRAVENOUS
  Filled 2023-09-24: qty 3

## 2023-09-24 MED ORDER — 0.9 % SODIUM CHLORIDE (POUR BTL) OPTIME
TOPICAL | Status: DC | PRN
  Administered 2023-09-24: 3000 mL

## 2023-09-24 MED ORDER — GLYCOPYRROLATE PF 0.2 MG/ML IJ SOSY
PREFILLED_SYRINGE | INTRAMUSCULAR | Status: AC
  Filled 2023-09-24: qty 1

## 2023-09-24 MED ORDER — ONDANSETRON HCL 4 MG/2ML IJ SOLN: 4.0000 mg | Freq: Four times a day (QID) | INTRAMUSCULAR | Status: DC | PRN

## 2023-09-24 MED ORDER — PHENYLEPHRINE HCL (PRESSORS) 10 MG/ML IV SOLN
INTRAVENOUS | Status: AC
  Filled 2023-09-24: qty 1

## 2023-09-24 MED ORDER — LABETALOL HCL 5 MG/ML IV SOLN: 10.0000 mg | INTRAVENOUS | Status: DC | PRN

## 2023-09-24 MED ORDER — SENNOSIDES-DOCUSATE SODIUM 8.6-50 MG PO TABS: 1.0000 | ORAL_TABLET | Freq: Every evening | ORAL | Status: DC | PRN

## 2023-09-24 MED ORDER — HYDRALAZINE HCL 20 MG/ML IJ SOLN: 5.0000 mg | INTRAMUSCULAR | Status: DC | PRN

## 2023-09-24 MED ORDER — SODIUM CHLORIDE 0.9 % IV SOLN: INTRAVENOUS | Status: DC

## 2023-09-24 MED ORDER — CYCLOBENZAPRINE HCL 10 MG PO TABS
10.0000 mg | ORAL_TABLET | Freq: Two times a day (BID) | ORAL | Status: DC | PRN
  Administered 2023-09-24 – 2023-09-25 (×2): 10 mg
  Filled 2023-09-24 (×2): qty 1

## 2023-09-24 MED ORDER — CHLORHEXIDINE GLUCONATE 0.12 % MT SOLN
15.0000 mL | OROMUCOSAL | Status: AC
  Administered 2023-09-24: 15 mL via OROMUCOSAL
  Filled 2023-09-24: qty 15

## 2023-09-24 MED ORDER — HEMOSTATIC AGENTS (NO CHARGE) OPTIME
TOPICAL | Status: DC | PRN
  Administered 2023-09-24: 1 via TOPICAL
  Administered 2023-09-24: 2 via TOPICAL
  Administered 2023-09-24: 1 via TOPICAL

## 2023-09-24 MED ORDER — ALPRAZOLAM 0.5 MG PO TABS
0.5000 mg | ORAL_TABLET | Freq: Three times a day (TID) | ORAL | Status: DC | PRN
  Administered 2023-09-24: 0.5 mg
  Filled 2023-09-24: qty 1

## 2023-09-24 MED ORDER — FENTANYL CITRATE (PF) 100 MCG/2ML IJ SOLN
25.0000 ug | INTRAMUSCULAR | Status: DC | PRN
  Administered 2023-09-24 (×2): 50 ug via INTRAVENOUS

## 2023-09-24 MED ORDER — HEPARIN 6000 UNIT IRRIGATION SOLUTION
Status: AC
  Filled 2023-09-24: qty 500

## 2023-09-24 MED ORDER — GABAPENTIN 300 MG PO CAPS
300.0000 mg | ORAL_CAPSULE | Freq: Two times a day (BID) | ORAL | Status: DC
  Administered 2023-09-24 – 2023-09-25 (×2): 300 mg
  Filled 2023-09-24 (×2): qty 1

## 2023-09-24 MED ORDER — FENTANYL CITRATE (PF) 250 MCG/5ML IJ SOLN
INTRAMUSCULAR | Status: DC | PRN
  Administered 2023-09-24: 100 ug via INTRAVENOUS
  Administered 2023-09-24: 50 ug via INTRAVENOUS
  Administered 2023-09-24: 100 ug via INTRAVENOUS

## 2023-09-24 MED ORDER — MIDAZOLAM HCL 2 MG/2ML IJ SOLN
INTRAMUSCULAR | Status: AC
  Filled 2023-09-24: qty 2

## 2023-09-24 MED ORDER — LIDOCAINE 2% (20 MG/ML) 5 ML SYRINGE
INTRAMUSCULAR | Status: DC | PRN
  Administered 2023-09-24: 80 mg via INTRAVENOUS

## 2023-09-24 MED ORDER — SODIUM CHLORIDE 0.9 % IV SOLN: INTRAVENOUS | Status: DC | PRN

## 2023-09-24 MED ORDER — PANTOPRAZOLE SODIUM 40 MG PO TBEC
40.0000 mg | DELAYED_RELEASE_TABLET | Freq: Every day | ORAL | Status: DC
  Administered 2023-09-24 – 2023-10-01 (×8): 40 mg via ORAL
  Filled 2023-09-24 (×8): qty 1

## 2023-09-24 SURGICAL SUPPLY — 54 items
APPLIER CLIP 9.375 SM OPEN (CLIP) ×3 IMPLANT
BAG COUNTER SPONGE SURGICOUNT (BAG) ×3 IMPLANT
BANDAGE ESMARK 6X9 LF (GAUZE/BANDAGES/DRESSINGS) IMPLANT
BLADE CLIPPER SURG (BLADE) ×3 IMPLANT
BNDG ELASTIC 4INX 5YD STR LF (GAUZE/BANDAGES/DRESSINGS) IMPLANT
BNDG ELASTIC 4X5.8 VLCR NS LF (GAUZE/BANDAGES/DRESSINGS) IMPLANT
BNDG ELASTIC 6INX 5YD STR LF (GAUZE/BANDAGES/DRESSINGS) IMPLANT
BNDG ELASTIC 6X10 VLCR STRL LF (GAUZE/BANDAGES/DRESSINGS) IMPLANT
BNDG ESMARK 6X9 LF (GAUZE/BANDAGES/DRESSINGS) ×3 IMPLANT
CANISTER SUCT 3000ML PPV (MISCELLANEOUS) ×3 IMPLANT
CANNULA VESSEL 3MM 2 BLNT TIP (CANNULA) IMPLANT
CLIP APPLIE 9.375 SM OPEN (CLIP) IMPLANT
CLIP TI MEDIUM 24 (CLIP) ×3 IMPLANT
CLIP TI WIDE RED SMALL 24 (CLIP) ×3 IMPLANT
COVER PROBE W GEL 5X96 (DRAPES) ×3 IMPLANT
COVER SURGICAL LIGHT HANDLE (MISCELLANEOUS) IMPLANT
CUFF TOURN SGL QUICK 34 NS (TOURNIQUET CUFF) IMPLANT
DERMABOND ADVANCED .7 DNX12 (GAUZE/BANDAGES/DRESSINGS) ×3 IMPLANT
DRAPE INCISE 23X17 STRL (DRAPES) IMPLANT
DRAPE INCISE IOBAN 23X17 STRL (DRAPES) ×3 IMPLANT
DRAPE WARM FLUID 44X44 (DRAPES) IMPLANT
DRSG COVADERM 4X6 (GAUZE/BANDAGES/DRESSINGS) IMPLANT
ELECT REM PT RETURN 9FT ADLT (ELECTROSURGICAL) ×3 IMPLANT
ELECTRODE REM PT RTRN 9FT ADLT (ELECTROSURGICAL) ×3 IMPLANT
GAUZE PACKING 1/2INX5YD STRL (GAUZE/BANDAGES/DRESSINGS) IMPLANT
GAUZE SPONGE 4X4 12PLY STRL (GAUZE/BANDAGES/DRESSINGS) IMPLANT
GLOVE BIO SURGEON STRL SZ7.5 (GLOVE) ×3 IMPLANT
GLOVE BIOGEL PI IND STRL 8 (GLOVE) ×3 IMPLANT
GOWN STRL REUS W/ TWL LRG LVL3 (GOWN DISPOSABLE) ×6 IMPLANT
GOWN STRL REUS W/ TWL XL LVL3 (GOWN DISPOSABLE) ×6 IMPLANT
HEMOSTAT SNOW SURGICEL 2X4 (HEMOSTASIS) IMPLANT
KIT BASIN OR (CUSTOM PROCEDURE TRAY) ×3 IMPLANT
KIT TURNOVER KIT B (KITS) ×3 IMPLANT
LOOP VESSEL MINI RED (MISCELLANEOUS) IMPLANT
NS IRRIG 1000ML POUR BTL (IV SOLUTION) ×6 IMPLANT
PACK PERIPHERAL VASCULAR (CUSTOM PROCEDURE TRAY) ×3 IMPLANT
PAD ARMBOARD 7.5X6 YLW CONV (MISCELLANEOUS) ×6 IMPLANT
POWDER SURGICEL 3.0 GRAM (HEMOSTASIS) IMPLANT
PUNCH AORTIC ROTATE 4.0MM (MISCELLANEOUS) IMPLANT
SPONGE INTESTINAL PEANUT (DISPOSABLE) IMPLANT
SPONGE T-LAP 18X18 ~~LOC~~+RFID (SPONGE) IMPLANT
SUT MNCRL AB 4-0 PS2 18 (SUTURE) ×6 IMPLANT
SUT PROLENE 5 0 C 1 24 (SUTURE) ×3 IMPLANT
SUT PROLENE 6 0 BV (SUTURE) ×3 IMPLANT
SUT SILK 2 0 SH (SUTURE) ×3 IMPLANT
SUT SILK 2-0 18XBRD TIE 12 (SUTURE) IMPLANT
SUT SILK 3-0 18XBRD TIE 12 (SUTURE) IMPLANT
SUT VIC AB 2-0 CT1 TAPERPNT 27 (SUTURE) ×6 IMPLANT
SUT VIC AB 3-0 SH 27X BRD (SUTURE) ×6 IMPLANT
TOWEL GREEN STERILE (TOWEL DISPOSABLE) ×3 IMPLANT
TRAY FOLEY MTR SLVR 16FR STAT (SET/KITS/TRAYS/PACK) ×3 IMPLANT
TUBE CONNECTING 20X1/4 (TUBING) IMPLANT
UNDERPAD 30X36 HEAVY ABSORB (UNDERPADS AND DIAPERS) ×3 IMPLANT
WATER STERILE IRR 1000ML POUR (IV SOLUTION) ×3 IMPLANT

## 2023-09-24 NOTE — Progress Notes (Signed)
Patient arrived at the unit,CHG bath given, CCMD notified, rt dorsalis pedis pulse Doppler,heparin infusion at 5 ml,pt oriented to the unit

## 2023-09-24 NOTE — Anesthesia Procedure Notes (Signed)
Procedure Name: Intubation Date/Time: 09/24/2023 10:04 AM  Performed by: Wilder Glade, CRNAPre-anesthesia Checklist: Patient identified, Suction available, Emergency Drugs available, Patient being monitored and Timeout performed Patient Re-evaluated:Patient Re-evaluated prior to induction Oxygen Delivery Method: Circle system utilized Preoxygenation: Pre-oxygenation with 100% oxygen Induction Type: IV induction Ventilation: Mask ventilation without difficulty Laryngoscope Size: Miller and 2 Grade View: Grade I Tube type: Oral Tube size: 7.5 mm Number of attempts: 1 Airway Equipment and Method: Stylet Placement Confirmation: ETT inserted through vocal cords under direct vision, positive ETCO2 and breath sounds checked- equal and bilateral Secured at: 23 cm Tube secured with: Tape Dental Injury: Teeth and Oropharynx as per pre-operative assessment  Comments: Smooth brief atraumatic dentition unchanged vss

## 2023-09-24 NOTE — H&P (Signed)
History and Physical Interval Note:  09/24/2023 9:43 AM  Tanner Phillips  has presented today for surgery, with the diagnosis of Critical limb ischemia of both lower extremities.  The various methods of treatment have been discussed with the patient and family. After consideration of risks, benefits and other options for treatment, the patient has consented to  Procedure(s): BYPASS GRAFT FEMORAL-TIBIAL ARTERY (Right) as a surgical intervention.  The patient's history has been reviewed, patient examined, no change in status, stable for surgery.  I have reviewed the patient's chart and labs.  Questions were answered to the patient's satisfaction.     Cephus Shelling     Patient name: Tanner Phillips  MRN: [id number]        DOB: 1956/05/31            Sex: male   REASON FOR CONSULT: Triage, tissue loss right foot   HPI: Tanner Phillips is a 67 y.o. male, with history of MI, morbid obesity, and PAD that presents for triage visit for worsening tissue loss in the right foot.  He states he developed a wound on his right heel as well as between his right fourth and fifth toe over the last week.  He is well-known to [place] and has undergone revascularization in the left leg.  He underwent a left common femoral to AT bypass on 08/13/2023 for CLI with tissue loss.  He required left fourth and fifth toe amps on 08/16/2023.  Briefly admitted over the weekend with concern for wound infection but has been doing Santyl.  Still doing wet-to-dry to the left foot at the site of toe amputations.       Past Medical History:  Diagnosis Date   Chest pain     History of Bell's palsy     Knee pain     Myocardial infarction (HCC)      14-15 yrs. ago   Obesity     Slipped cervical disc     SOB (shortness of breath)                 Past Surgical History:  Procedure Laterality Date   ABDOMINAL AORTOGRAM W/LOWER EXTREMITY Bilateral 08/10/2023    Procedure: ABDOMINAL AORTOGRAM W/LOWER EXTREMITY;  Surgeon: Leonie Douglas,  MD;  Location: [facility];  Service: Cardiovascular;  Laterality: Bilateral;   AMPUTATION Left 08/16/2023    Procedure: LEFT FOURTH AND FIFTH TOE AMPUTATIONS WITH PLACEMENT OF WOUND VAC;  Surgeon: Daria Pastures, MD;  Location: [facility] OR;  Service: Vascular;  Laterality: Left;   APPLICATION OF WOUND VAC Left 08/13/2023    Procedure: APPLICATION OF WOUND VAC;  Surgeon: Cephus Shelling, MD;  Location: [facility] OR;  Service: Vascular;  Laterality: Left;   BACK SURGERY        LOWER   CHOLECYSTECTOMY       COLONOSCOPY WITH PROPOFOL N/A 06/03/2015    Procedure: COLONOSCOPY WITH PROPOFOL;  Surgeon: Charna Elizabeth, MD;  Location: [facility];  Service: Endoscopy;  Laterality: N/A;   COLONOSCOPY WITH PROPOFOL N/A 07/27/2020    Procedure: COLONOSCOPY WITH PROPOFOL;  Surgeon: Charna Elizabeth, MD;  Location: [facility]OS[facility];  Service: Endoscopy;  Laterality: N/A;   ESOPHAGOGASTRODUODENOSCOPY (EGD) WITH PROPOFOL N/A 07/27/2020    Procedure: ESOPHAGOGASTRODUODENOSCOPY (EGD) WITH PROPOFOL;  Surgeon: Charna Elizabeth, MD;  Location: [facility];  Service: Endoscopy;  Laterality: N/A;   FEMORAL-TIBIAL BYPASS GRAFT Left 08/13/2023    Procedure: LEFT COMMON FEMORAL-ANTERIOR TIBIAL ARTERY BYPASS;  Surgeon: Chestine Spore,  Canary Brim, MD;  Location: [facility] OR;  Service: Vascular;  Laterality: Left;   Left Shoulder Surgery       Right Shoulder Surgery       THROMBECTOMY OF BYPASS GRAFT FEMORAL- POPLITEAL ARTERY Left 08/13/2023    Procedure: LEFT ANTERIOR TIBIAL THROMBECTOMY;  Surgeon: Cephus Shelling, MD;  Location: [facility] OR;  Service: Vascular;  Laterality: Left;   VEIN HARVEST Left 08/13/2023    Procedure: VEIN HARVEST OF GREATER SAPHEOUS VEIN;  Surgeon: Cephus Shelling, MD;  Location: [facility] OR;  Service: Vascular;  Laterality: Left;          History reviewed. No pertinent family history.       SOCIAL HISTORY: Social History         Socioeconomic History   Marital status: Married      Spouse name: Not on file    Number of children: Not on file   Years of education: Not on file   Highest education level: Not on file  Occupational History   Not on file  Tobacco Use   Smoking status: Never   Smokeless tobacco: Never  Substance and Sexual Activity   Alcohol use: Yes      Comment: social   Drug use: No   Sexual activity: Not on file  Other Topics Concern   Not on file  Social History Narrative    MARRIED -WORKS AT [employer]    Social Determinants of Health        Financial Resource Strain: Not on file  Food Insecurity: No Food Insecurity (09/14/2023)    Hunger Vital Sign     Worried About Running Out of Food in the Last Year: Never true     Ran Out of Food in the Last Year: Never true  Transportation Needs: No Transportation Needs (09/14/2023)    PRAPARE - Therapist, art (Medical): No     Lack of Transportation (Non-Medical): No  Physical Activity: Not on file  Stress: Not on file  Social Connections: Not on file  Intimate Partner Violence: Not At Risk (09/14/2023)    Humiliation, Afraid, Rape, and Kick questionnaire     Fear of Current or Ex-Partner: No     Emotionally Abused: No     Physically Abused: No     Sexually Abused: No      Allergies  No Known Allergies           Current Outpatient Medications  Medication Sig Dispense Refill   ALPRAZolam (XANAX) 0.5 MG tablet Take 0.5 mg by mouth 3 (three) times daily as needed for anxiety.       amLODipine (NORVASC) 10 MG tablet Take 10 mg by mouth daily.       apixaban (ELIQUIS) 5 MG TABS tablet Take 1 tablet (5 mg total) by mouth 2 (two) times daily. 60 tablet 0   aspirin EC 81 MG tablet Take 1 tablet (81 mg total) by mouth daily. Swallow whole. 30 tablet 0   atorvastatin (LIPITOR) 40 MG tablet Take 1 tablet (40 mg total) by mouth daily. 90 tablet 0   collagenase (SANTYL) 250 UNIT/GM ointment Apply topically daily. 30 g 0   cyclobenzaprine (FLEXERIL) 10 MG tablet Take 10 mg by mouth 2 (two) times  daily as needed for muscle spasms.       doxycycline (VIBRAMYCIN) 100 MG capsule Take 1 capsule (100 mg total) by mouth 2 (two) times daily. 20 capsule 0   gabapentin (  NEURONTIN) 300 MG capsule Take 300 mg by mouth 2 (two) times daily.       HYDROcodone-acetaminophen (NORCO/VICODIN) 5-325 MG tablet Take 1 tablet by mouth 3 (three) times daily as needed for moderate pain (pain score 4-6) or severe pain (pain score 7-10).       ibuprofen (ADVIL) 200 MG tablet Take 600 mg by mouth as needed for mild pain (pain score 1-3) or moderate pain (pain score 4-6).       zolpidem (AMBIEN) 10 MG tablet Take 10 mg by mouth at bedtime.          No current facility-administered medications for this visit.        REVIEW OF SYSTEMS:  [X]  denotes positive finding, [ ]  denotes negative finding Cardiac   Comments:  Chest pain or chest pressure:      Shortness of breath upon exertion:      Short of breath when lying flat:      Irregular heart rhythm:             Vascular      Pain in calf, thigh, or hip brought on by ambulation:      Pain in feet at night that wakes you up from your sleep:       Blood clot in your veins:      Leg swelling:              Pulmonary      Oxygen at home:      Productive cough:       Wheezing:              Neurologic      Sudden weakness in arms or legs:       Sudden numbness in arms or legs:       Sudden onset of difficulty speaking or slurred speech:      Temporary loss of vision in one eye:       Problems with dizziness:              Gastrointestinal      Blood in stool:       Vomited blood:              Genitourinary      Burning when urinating:       Blood in urine:             Psychiatric      Major depression:              Hematologic      Bleeding problems:      Problems with blood clotting too easily:             Skin      Rashes or ulcers:             Constitutional      Fever or chills:          PHYSICAL EXAM:    Vitals:    09/18/23 1436   BP: 124/68  Pulse: (!) 110  Resp: 18  Temp: 98.8 F (37.1 C)  TempSrc: Temporal  SpO2: 90%  Weight: 266 lb 14.4 oz (121.1 kg)  Height: 5\' 5"  (1.651 m)      GENERAL: The patient is a well-nourished male, in no acute distress. The vital signs are documented above. CARDIAC: There is a regular rate and rhythm.  VASCULAR:  Right femoral pulse palpable Ulcer on the right heel and between the right fourth  and fifth toes as pictured Left DP palpable Left 4th 5th toe amputations open PULMONARY: No respiratory distress. ABDOMEN: Soft and non-tender. MUSCULOSKELETAL: There are no major deformities or cyanosis. NEUROLOGIC: No focal weakness or paresthesias are detected.             DATA:    ABIs on the right are 0.27 monophasic   Assessment/Plan:   67 y.o. male, with history of MI, morbid obesity, and PAD that presents for triage visit for worsening tissue loss in the right foot.  He states he developed a wound on his right heel as well as between his right fourth and fifth toe.  He is well-known to [place] and has undergone revascularization in the left leg.  He underwent a left common femoral to AT bypass on 08/13/2023.  He required left fourth and fifth toe amps on 08/16/2023. Unfortunately now with new tissue loss on the right foot which is the other leg that has not undergone bypass.  Previously had very progressive tissue loss on the left.  He has a known right SFA flush occlusion with popliteal occlusion and multilevel occlusive disease.  I reviewed his angiogram images and with an ABI of 0.27 he has no chance of healing his right foot tissue loss.  Have recommended a right tibial bypass on Monday likely to a peroneal target.  He does appear to have good vein.  Risk benefits again discussed.  Again discussed he is very high risk for limb loss even with revascularization.       Cephus Shelling, MD [facility] [place] [facility] Office: [phone]

## 2023-09-24 NOTE — Anesthesia Preprocedure Evaluation (Signed)
Anesthesia Evaluation  Patient identified by MRN, date of birth, ID band Patient awake    Reviewed: Allergy & Precautions, H&P , NPO status , Patient's Chart, lab work & pertinent test results  Airway Mallampati: II   Neck ROM: full    Dental   Pulmonary neg pulmonary ROS   breath sounds clear to auscultation       Cardiovascular hypertension, + Past MI and + Peripheral Vascular Disease   Rhythm:regular Rate:Normal  MI 15-16 years ago.   Neuro/Psych  Neuromuscular disease    GI/Hepatic   Endo/Other    Class 3 obesity  Renal/GU Renal InsufficiencyRenal disease     Musculoskeletal   Abdominal   Peds  Hematology   Anesthesia Other Findings   Reproductive/Obstetrics                             Anesthesia Physical Anesthesia Plan  ASA: 3  Anesthesia Plan: General   Post-op Pain Management:    Induction: Intravenous  PONV Risk Score and Plan: 2 and Ondansetron, Dexamethasone, Midazolam and Treatment may vary due to age or medical condition  Airway Management Planned: Oral ETT  Additional Equipment: Arterial line  Intra-op Plan:   Post-operative Plan: Extubation in OR  Informed Consent: I have reviewed the patients History and Physical, chart, labs and discussed the procedure including the risks, benefits and alternatives for the proposed anesthesia with the patient or authorized representative who has indicated his/her understanding and acceptance.     Dental advisory given  Plan Discussed with: CRNA, Anesthesiologist and Surgeon  Anesthesia Plan Comments:        Anesthesia Quick Evaluation

## 2023-09-24 NOTE — Anesthesia Procedure Notes (Signed)
Arterial Line Insertion Start/End12/16/2024 8:45 AM, 09/24/2023 8:55 AM Performed by: Achille Rich, MD  Patient location: Pre-op. Preanesthetic checklist: patient identified, IV checked, site marked, risks and benefits discussed, surgical consent, monitors and equipment checked, pre-op evaluation, timeout performed and anesthesia consent Lidocaine 1% used for infiltration Left, radial was placed Catheter size: 20 G Hand hygiene performed  and maximum sterile barriers used   Attempts: 1 Procedure performed using ultrasound guided technique. Ultrasound Notes:anatomy identified, needle tip was noted to be adjacent to the nerve/plexus identified and no ultrasound evidence of intravascular and/or intraneural injection Following insertion, dressing applied and Biopatch. Post procedure assessment: normal and unchanged  Patient tolerated the procedure well with no immediate complications.

## 2023-09-24 NOTE — Op Note (Signed)
Date: September 24, 2023  Preoperative diagnosis: Critical limb ischemia of the right lower extremity with tissue loss  Postoperative diagnosis: Same  Procedure: 1.  Harvest of right leg great saphenous vein 2.  Right peroneal endarterectomy 3.  Right common femoral artery to peroneal artery bypass with ipsilateral reversed great saphenous vein tunneled through the saphenectomy incision 4.  Debridement of left heel  Surgeon: Dr. Cephus Shelling, MD  Assistant: Ladonna Snide, MD and Mosetta Pigeon, [place]  Indications: 67 year old male recently underwent left lower extremity bypass for critical limb ischemia with tissue loss with a common femoral to anterior tibial bypass.  Now presents for bypass in the contralateral right leg where he has a flush SFA occlusion with only a peroneal that reconstitutes in the mid calf with new tissue loss in the right lower extremity.  He presents after risks benefits discussed.  An assistant was needed given the complexity of the case and also to sew the anastomosis and harvest the vein.  Findings: The right great saphenous vein was harvested from the saphenofemoral junction to the distal calf and was of excellent caliber.  This was then sewn end to side to the right common femoral artery in reversed fashion and tunneled through the saphenectomy incisions.  The peroneal artery was significantly diseased and the peroneal veins had evidence thrombus in them and were severely dilated with evidence of prior DVT.  Once we opened the peroneal artery we had to perform endarterectomy in order to find a decent lumen for an anastomosis and I was able to tack down the endarterectomy site distally.  An end to side anastomosis was sewn to the right peroneal artery in the mid calf.  Brisk peroneal signal at completion.  Anesthesia: General  Details: Patient was taken to the operating room after informed consent was obtained.  Placed on operative table in the supine  position.  General endotracheal anesthesia was induced.  I then used ultrasound to mark out the great saphenous vein in the right thigh and calf.  This was of excellent caliber.  I did debride the large eschar off the left heel with finger dissection as well as Metzenbaum scissors prior to the sterile portion of the procedure.  We placed a wet-to-dry dressing here on the left heel and he recently had left leg bypass.  There was good bleeding.  The right groin and right leg were then prepped and draped in standard sterile fashion.  Ultimately antibiotics were given and timeout performed.  I started in the right groin with a horizontal incision above the inguinal crease dissected through the subcutaneous tissue with Bovie cautery and opened the femoral sheath longitudinally.  Used cerebellar retractors.  This was a tedious dissection given morbid obesity.  The common femoral artery was then dissected out in the right groin as well as the external iliac and SFA and profunda and all branches were controlled with Vesseloops.  Dr. Karin Lieu then started in the distal calf and we made skip incisions and the saphenous vein was harvested with all side branches ligated between 3-0 silk ties and divided.  I then harvested great saphenous vein in the thigh again using skip incisions and meeting Dr. Karin Lieu.  The vein was harvested between the skin tunnels as well.  The peroneal artery was then exposed in the mid calf by dividing the fascia and entering into the deep posterior compartment by taking down the soleus muscle.  There was significantly dilated peroneal veins with evidence of thrombus.  The peroneal artery was severely diseased and scarred in place which made dissection very tedious.  Ultimately we were able to dissect out a portion of the peroneal artery in the mid calf but again this was challenging given the thrombosed peroneal veins.  At that point time we finished harvesting the great saphenous vein from the distal  calf all the way up to the saphenofemoral junction and this was passed between the skin bridges after it was divided distally.  I oversewed it at the saphenofemoral junction with 5-0 Prolene.  The vein was reversed and placed a vessel cannulas dilated nicely and had to repair several branches with 6-0 Prolene.  Patient was then given 100 units per kg IV heparin.  We checked ACT's to maintain greater than 250.  I then went ahead and controlled the SFA profunda with Vesseloops the common femoral with Henley clamp.  The common femorals was opened with 11 blade scalpel Potts scissors.  I then used an aortic punch on the anterior wall of the common femoral artery.  The vein was brought on the field and reversed fashion and I spatulated the vein and sewed an end to side anastomosis to the right common femoral using 5-0 Prolene parachute technique with the help of my assistant.  The vein dilated nicely and had good pulsatile flow.  I then marked the vein for orientation by placing 2 clips distally.  I then marked the orientation and this was passed between the skin bridges down to the peroneal target through the saphenectomy incision.  Ultimately I ended up putting a tourniquet on the upper thigh and exsanguinated the leg with an Esmarch.  I went up on the tourniquet.  The peroneal artery was opened with 11 blade scalpel and Potts scissors.  I extended this arteriotomy distally until we had what look like a healthy appearing lumen.  The artery was very diseased.  I was able to pass a #2 dilator distally.  I had to endarterectomize the artery and stopped the endarterectomy just before where we extended the arteriotomy and this was tacked down with a 6-0 Prolene.  I did repair the posterior wall with multiple 6-0 Prolene's.  I then spatulated the vein after we cut it to the appropriate length and end to side anastomosis was sewn to the peroneal artery with 6-0 Prolene parachute technique with the help of my asistant.  I did  put multiple repair stitches in the anastomosis as this was tenuous.  We had a brisk triphasic peroneal signal and a peroneal signal at the ankle.  Protamine was given for reversal.  We irrigated out all the incisions and these were all closed in multiple layers of 2-0 Vicryl, 3-0 Vicryl, 4-0 Monocryl and Dermabond.  I did place a gentle ace wrap on the right leg.  Taken to recovery in stable condition.  Complication: None  Condition: Stable  Cephus Shelling, MD [facility] [place] Office: [phone]   Cephus Shelling

## 2023-09-24 NOTE — Transfer of Care (Signed)
Immediate Anesthesia Transfer of Care Note  Patient: Tanner Phillips  Procedure(s) Performed: RIGHT BYPASS GRAFT COMMON FEMORAL-PERONEAL ARTERY USING RIGHT REVERSED GREATER SAPHENOUS VEIN (Right: Leg Upper) DEBRIDEMENT OF LEFT HEEL WOUND (Left: Heel) GREATER SAPHENOUS VEIN HARVEST (Right: Leg Upper) RIGHT ENDARTERECTOMY PERONEAL (Right: Leg Lower)  Patient Location: PACU  Anesthesia Type:General  Level of Consciousness: awake, alert , and patient cooperative  Airway & Oxygen Therapy: Patient Spontanous Breathing and Patient connected to face mask oxygen  Post-op Assessment: Reviewed and stable  Post vital signs: Reviewed and stable  Last Vitals:  Vitals Value Taken Time  BP 128/71 09/24/23 1519  Temp    Pulse 93 09/24/23 1524  Resp 14 09/24/23 1524  SpO2 95 % 09/24/23 1524  Vitals shown include unfiled device data.  Last Pain:  Vitals:   09/24/23 0751  PainSc: 0-No pain      Patients Stated Pain Goal: 0 (09/24/23 0751)  Complications: No notable events documented.

## 2023-09-25 ENCOUNTER — Encounter (HOSPITAL_COMMUNITY): Payer: Self-pay | Admitting: Vascular Surgery

## 2023-09-25 LAB — CBC
HCT: 28.3 % — ABNORMAL LOW (ref 39.0–52.0)
Hemoglobin: 8.8 g/dL — ABNORMAL LOW (ref 13.0–17.0)
MCH: 27.9 pg (ref 26.0–34.0)
MCHC: 31.1 g/dL (ref 30.0–36.0)
MCV: 89.8 fL (ref 80.0–100.0)
Platelets: 279 10*3/uL (ref 150–400)
RBC: 3.15 MIL/uL — ABNORMAL LOW (ref 4.22–5.81)
RDW: 15.2 % (ref 11.5–15.5)
WBC: 11.5 10*3/uL — ABNORMAL HIGH (ref 4.0–10.5)
nRBC: 0 % (ref 0.0–0.2)

## 2023-09-25 LAB — LIPID PANEL
Cholesterol: 80 mg/dL (ref 0–200)
HDL: 25 mg/dL — ABNORMAL LOW (ref >40)
LDL Cholesterol: 36 mg/dL (ref 0–99)
Total CHOL/HDL Ratio: 3.2 {ratio}
Triglycerides: 94 mg/dL (ref <150)
VLDL: 19 mg/dL (ref 0–40)

## 2023-09-25 LAB — BASIC METABOLIC PANEL
Anion gap: 6 (ref 5–15)
BUN: 13 mg/dL (ref 8–23)
CO2: 22 mmol/L (ref 22–32)
Calcium: 8 mg/dL — ABNORMAL LOW (ref 8.9–10.3)
Chloride: 109 mmol/L (ref 98–111)
Creatinine, Ser: 1.64 mg/dL — ABNORMAL HIGH (ref 0.61–1.24)
GFR, Estimated: 45 mL/min — ABNORMAL LOW (ref >60)
Glucose, Bld: 115 mg/dL — ABNORMAL HIGH (ref 70–99)
Potassium: 4.1 mmol/L (ref 3.5–5.1)
Sodium: 137 mmol/L (ref 135–145)

## 2023-09-25 LAB — APTT: aPTT: 26 s (ref 24–36)

## 2023-09-25 MED ORDER — GABAPENTIN 300 MG PO CAPS
300.0000 mg | ORAL_CAPSULE | Freq: Two times a day (BID) | ORAL | Status: DC
  Administered 2023-09-25 – 2023-10-01 (×12): 300 mg via ORAL
  Filled 2023-09-25 (×13): qty 1

## 2023-09-25 MED ORDER — ALUM & MAG HYDROXIDE-SIMETH 200-200-20 MG/5ML PO SUSP: 15.0000 mL | ORAL | Status: DC | PRN

## 2023-09-25 MED ORDER — SENNOSIDES-DOCUSATE SODIUM 8.6-50 MG PO TABS
1.0000 | ORAL_TABLET | Freq: Every evening | ORAL | Status: DC | PRN
  Administered 2023-09-28: 1 via ORAL
  Filled 2023-09-25 (×2): qty 1

## 2023-09-25 MED ORDER — DOXYCYCLINE HYCLATE 100 MG PO TABS
100.0000 mg | ORAL_TABLET | Freq: Two times a day (BID) | ORAL | Status: DC
  Administered 2023-09-25 – 2023-10-01 (×12): 100 mg via ORAL
  Filled 2023-09-25 (×12): qty 1

## 2023-09-25 MED ORDER — CYCLOBENZAPRINE HCL 10 MG PO TABS
10.0000 mg | ORAL_TABLET | Freq: Two times a day (BID) | ORAL | Status: DC | PRN
  Administered 2023-09-26 (×2): 10 mg via ORAL
  Filled 2023-09-25 (×2): qty 1

## 2023-09-25 MED ORDER — AMLODIPINE BESYLATE 10 MG PO TABS
10.0000 mg | ORAL_TABLET | Freq: Every day | ORAL | Status: DC
  Administered 2023-09-26 – 2023-10-01 (×6): 10 mg via ORAL
  Filled 2023-09-25 (×6): qty 1

## 2023-09-25 MED ORDER — HYDROCODONE-ACETAMINOPHEN 5-325 MG PO TABS
1.0000 | ORAL_TABLET | ORAL | Status: DC | PRN
  Administered 2023-09-25 – 2023-09-26 (×3): 2 via ORAL
  Administered 2023-10-01: 1 via ORAL
  Filled 2023-09-25: qty 2
  Filled 2023-09-25: qty 1
  Filled 2023-09-25: qty 2
  Filled 2023-09-25: qty 1
  Filled 2023-09-25: qty 2

## 2023-09-25 MED ORDER — ZOLPIDEM TARTRATE 5 MG PO TABS
10.0000 mg | ORAL_TABLET | Freq: Every day | ORAL | Status: DC
  Administered 2023-09-25 – 2023-09-26 (×2): 10 mg via ORAL
  Filled 2023-09-25 (×2): qty 2

## 2023-09-25 MED ORDER — DIPHENHYDRAMINE HCL 50 MG/ML IJ SOLN
12.5000 mg | Freq: Four times a day (QID) | INTRAMUSCULAR | Status: DC | PRN
  Administered 2023-09-25: 12.5 mg via INTRAVENOUS
  Filled 2023-09-25: qty 1

## 2023-09-25 MED ORDER — IBUPROFEN 400 MG PO TABS: 600.0000 mg | ORAL_TABLET | ORAL | Status: DC | PRN

## 2023-09-25 MED ORDER — ATORVASTATIN CALCIUM 40 MG PO TABS
40.0000 mg | ORAL_TABLET | Freq: Every day | ORAL | Status: DC
  Administered 2023-09-26 – 2023-10-01 (×6): 40 mg via ORAL
  Filled 2023-09-25 (×6): qty 1

## 2023-09-25 NOTE — Progress Notes (Signed)
PHARMACIST LIPID MONITORING   Tanner Phillips is a 67 y.o. male admitted on 09/24/2023 with critical limb ischemia of both lower extremeties.  Pharmacy has been consulted to optimize lipid-lowering therapy with the indication of secondary prevention for clinical ASCVD.  Recent Labs:  Lipid Panel (last 6 months):   Lab Results  Component Value Date   CHOL 80 09/25/2023   TRIG 94 09/25/2023   HDL 25 (L) 09/25/2023   CHOLHDL 3.2 09/25/2023   VLDL 19 09/25/2023   LDLCALC 36 09/25/2023    Hepatic function panel (last 6 months):   Lab Results  Component Value Date   AST 12 (L) 09/24/2023   ALT 14 09/24/2023   ALKPHOS 53 09/24/2023   BILITOT 0.7 09/24/2023    SCr (since admission):   Serum creatinine: 1.64 mg/dL (H) 95/63/87 5643 Estimated creatinine clearance: 50.6 mL/min (A)  Current therapy and lipid therapy tolerance Current lipid-lowering therapy: atorvastatin 40 mg Previous lipid-lowering therapies (if applicable): n/a Documented or reported allergies or intolerances to lipid-lowering therapies (if applicable): n/a  Assessment:   Patient agrees with changes to lipid-lowering therapy  Plan:    1.Statin intensity (high intensity recommended for all patients regardless of the LDL):  No statin changes. The patient is already on a high intensity statin.  2.Add ezetimibe (if any one of the following):   Not indicated at this time.  3.Refer to lipid clinic:   No  4.Follow-up with:  Primary care provider - Ralene Ok, MD  5.Follow-up labs after discharge:  No changes in lipid therapy, repeat a lipid panel in one year.      Burnett Kanaris, PharmD 09/25/2023, 1:28 PM

## 2023-09-25 NOTE — Anesthesia Postprocedure Evaluation (Signed)
Anesthesia Post Note  Patient: Tanner Phillips  Procedure(s) Performed: RIGHT BYPASS GRAFT COMMON FEMORAL-PERONEAL ARTERY USING RIGHT REVERSED GREATER SAPHENOUS VEIN (Right: Leg Upper) DEBRIDEMENT OF LEFT HEEL WOUND (Left: Heel) GREATER SAPHENOUS VEIN HARVEST (Right: Leg Upper) RIGHT ENDARTERECTOMY PERONEAL (Right: Leg Lower)     Patient location during evaluation: PACU Anesthesia Type: General Level of consciousness: awake and alert Pain management: pain level controlled Vital Signs Assessment: post-procedure vital signs reviewed and stable Respiratory status: spontaneous breathing, nonlabored ventilation, respiratory function stable and patient connected to nasal cannula oxygen Cardiovascular status: blood pressure returned to baseline and stable Postop Assessment: no apparent nausea or vomiting Anesthetic complications: no   No notable events documented.  Last Vitals:  Vitals:   09/24/23 2321 09/25/23 0400  BP: (!) 144/85 (!) 143/69  Pulse: 100 91  Resp: 18 (!) 21  Temp: 36.7 C 36.9 C  SpO2: 94% 93%    Last Pain:  Vitals:   09/25/23 0656  TempSrc:   PainSc: 5                  Tomi Paddock S

## 2023-09-25 NOTE — Progress Notes (Addendum)
Progress Note    09/25/2023 6:37 AM 1 Day Post-Op  Subjective:  sleepy but awakes.  Wife at bedside.  Says he is a little out of it due to pain medicine.  Says the right foot is already looking better.  He is complaining of itching    Afebrile HR 90's-100's NSR 100's-140's systolic 95% 2LO2NC   Vitals:   09/24/23 2321 09/25/23 0400  BP: (!) 144/85 (!) 143/69  Pulse: 100 91  Resp: 18 (!) 21  Temp: 98 F (36.7 C) 98.4 F (36.9 C)  SpO2: 94% 93%    Physical Exam: General:  no distress Cardiac:  regular Lungs:  non labored Incisions:  right groin clean; left thigh wound on incision clean Extremities:  brisk right peroneal doppler signal.  Ace wraps present BLE Abdomen:  soft  CBC    Component Value Date/Time   WBC 11.5 (H) 09/25/2023 0345   RBC 3.15 (L) 09/25/2023 0345   HGB 8.8 (L) 09/25/2023 0345   HCT 28.3 (L) 09/25/2023 0345   PLT 279 09/25/2023 0345   MCV 89.8 09/25/2023 0345   MCH 27.9 09/25/2023 0345   MCHC 31.1 09/25/2023 0345   RDW 15.2 09/25/2023 0345   LYMPHSABS 1.2 08/06/2023 0927   MONOABS 1.3 (H) 08/06/2023 0927   EOSABS 0.1 08/06/2023 0927   BASOSABS 0.1 08/06/2023 0927    BMET    Component Value Date/Time   NA 137 09/25/2023 0345   K 4.1 09/25/2023 0345   CL 109 09/25/2023 0345   CO2 22 09/25/2023 0345   GLUCOSE 115 (H) 09/25/2023 0345   BUN 13 09/25/2023 0345   CREATININE 1.64 (H) 09/25/2023 0345   CALCIUM 8.0 (L) 09/25/2023 0345   GFRNONAA 45 (L) 09/25/2023 0345   GFRAA 52 (L) 05/21/2017 0010    INR    Component Value Date/Time   INR 1.0 09/24/2023 0911     Intake/Output Summary (Last 24 hours) at 09/25/2023 1610 Last data filed at 09/25/2023 0600 Gross per 24 hour  Intake 5071.07 ml  Output 3475 ml  Net 1596.07 ml      Assessment/Plan:  67 y.o. male is s/p:  Right CFA to peroneal bypass with ipsilateral reversed GSV through saphenectomy tunnel and right peroneal endarterectomy and left heel debridement  1 Day  Post-Op   -pt with brisk right peroneal doppler signal.  Left ace wrap in place bilateral legs.  I have asked the RN to place dry gauze in right groin to wick moisture.  Left thigh wet to dry with 2x2 placed.  Float both heels.  -itching - will give benadryl  -acute surgical blood loss anemia-BP tolerating.   -creatinine 1.64 up just slightly from 1.57.  continue to monitor -DVT prophylaxis:  heparin gtt at 500u/hr -check labs in am -continue asa/statin   Doreatha Massed, PA-C [facility] [phone] 09/25/2023 6:37 AM  I have seen and evaluated the patient. I agree with the PA note as documented above.  Postop day 1 status post right common femoral to peroneal bypass.  Has a palpable pulse in the bypass graft.  Brisk triphasic peroneal signal at the ankle.  Right groin looks good and calf is soft.  Continue heparin at 500 units an hour today.  Will hold restarting Eliquis for the time being.  Out of bed and mobilize.  Labs reassuring.  Will keep compression dressing to the right leg today due to high risk for swelling.  Have asked nursing to changes wet-to-dry dressings to the  left leg that is undergone previous anterior tibial bypass for tissue loss.  Cephus Shelling, MD [facility] Office: [phone]

## 2023-09-25 NOTE — Progress Notes (Signed)
OT Cancellation Note  Patient Details Name: Tanner Phillips MRN: [id number] DOB: Apr 01, 1956   Cancelled Treatment:    Reason Eval/Treat Not Completed: Pain limiting ability to participate (Nurse administering pain medication when entering the room and pt requesting to come back later.) Will continue to follow.   Presley Raddle OTR/L  Acute Rehab Services  [phone] office number   Alphia Moh 09/25/2023, 10:53 AM

## 2023-09-25 NOTE — Evaluation (Signed)
Occupational Therapy Evaluation Patient Details Name: Tanner Phillips MRN: [id number] DOB: 1956-10-03 Today's Date: 09/25/2023   History of Present Illness Pt is a 67 yo male admitted 12/16 for Right CFA to peroneal BPG, right peroneal endarterectomy and left heel debridement.Marland Kitchen PMHx: LLE DVT, 08/13/23 Lt common femoral to ant tib BPG. 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 ruptured disk in back, HTN, CAD s/p MI   Clinical Impression   Pt at this time required a lot of motivation to complete any OOB activity as they are fearful about how much pain they had following session with PT. However, once educated and given increase in time about mobility became agreeable. Pt then was able to complete bed mobility with min guard with HOB elevated and use of bed rail. He then completed sit to stand transfer with min to moderate assist as again fearful of pain but was able to complete on second trial with min assist with mod cues on hand placement. Pt requires set up for UE ADLS while sitting and mod to max assist with LB ADLS. Pt and wife recalled from last time in the hospital about AE for LE ADLS. Acute Occupational Therapy will continue to follow.        If plan is discharge home, recommend the following: A little help with walking and/or transfers;A little help with bathing/dressing/bathroom;Assistance with cooking/housework;Assist for transportation    Functional Status Assessment  Patient has had a recent decline in their functional status and demonstrates the ability to make significant improvements in function in a reasonable and predictable amount of time.  Equipment Recommendations  BSC/3in1    Recommendations for Other Services       Precautions / Restrictions Precautions Precautions: Fall Restrictions Weight Bearing Restrictions Per Provider Order: Yes RLE Weight Bearing Per Provider Order: Weight bearing as tolerated LLE Weight Bearing Per Provider Order: Weight bearing as tolerated       Mobility Bed Mobility Overal bed mobility: Needs Assistance Bed Mobility: Supine to Sit     Supine to sit: Contact guard, HOB elevated, Used rails          Transfers Overall transfer level: Needs assistance Equipment used: Rolling walker (2 wheels) Transfers: Sit to/from Stand Sit to Stand: Mod assist, Min assist                  Balance Overall balance assessment: Needs assistance Sitting-balance support: Feet supported Sitting balance-Leahy Scale: Good     Standing balance support: Bilateral upper extremity supported, No upper extremity supported Standing balance-Leahy Scale: Fair Standing balance comment: was able to complete unsupported stadning and reaching with no LOB                           ADL either performed or assessed with clinical judgement   ADL Overall ADL's : Needs assistance/impaired Eating/Feeding: Set up;Sitting   Grooming: Wash/dry hands;Wash/dry face;Set up;Sitting   Upper Body Bathing: Set up;Sitting   Lower Body Bathing: Moderate assistance;Maximal assistance;Sit to/from stand   Upper Body Dressing : Set up;Sitting   Lower Body Dressing: Moderate assistance;Maximal assistance;Sit to/from stand   Toilet Transfer: Cueing for safety;Cueing for sequencing;Rolling walker (2 wheels);Moderate assistance   Toileting- Clothing Manipulation and Hygiene: Moderate assistance;Sit to/from stand       Functional mobility during ADLs: Contact guard assist;Rolling walker (2 wheels) General ADL Comments: Pt only agreeable for EOB     Vision   Vision Assessment?: No apparent visual deficits  Perception         Praxis         Pertinent Vitals/Pain Pain Assessment Pain Assessment: Faces Pain Location: LLE adn RLE Pain Descriptors / Indicators: Discomfort, Grimacing, Headache Pain Intervention(s): Monitored during session, Limited activity within patient's tolerance, Premedicated before session     Extremity/Trunk  Assessment Upper Extremity Assessment Upper Extremity Assessment: Generalized weakness   Lower Extremity Assessment Lower Extremity Assessment: Defer to PT evaluation   Cervical / Trunk Assessment Cervical / Trunk Assessment: Kyphotic   Communication Communication Communication: No apparent difficulties   Cognition Arousal: Alert Behavior During Therapy: WFL for tasks assessed/performed Overall Cognitive Status: Within Functional Limits for tasks assessed                                       General Comments       Exercises     Shoulder Instructions      Home Living Family/patient expects to be discharged to:: Private residence Living Arrangements: Spouse/significant other Available Help at Discharge: Family;Available 24 hours/day Type of Home: House Home Access: Stairs to enter [employer] of Steps: 2   Home Layout: One level     Bathroom Shower/Tub: Walk-in shower;Door   [employer] Toilet: Standard Bathroom Accessibility: Yes How Accessible: Accessible via walker Home Equipment: Agricultural consultant (2 wheels);Wheelchair - Psychologist, sport and exercise (4 wheels);Transport chair;BSC/3in1   Additional Comments: Pt needs 3:1      Prior Functioning/Environment Prior Level of Function : Independent/Modified Independent             Mobility Comments: independent ADLs Comments: independent        OT Problem List:        OT Treatment/Interventions:      OT Goals(Current goals can be found in the care plan section) Acute Rehab OT Goals Patient Stated Goal: to have less pain OT Goal Formulation: With patient Time For Goal Achievement: 10/09/23 Potential to Achieve Goals: Good  OT Frequency: Min 1X/week    Co-evaluation              AM-PAC OT "6 Clicks" Daily Activity     Outcome Measure Help from another person eating meals?: None Help from another person taking care of personal grooming?: None Help from another person  toileting, which includes using toliet, bedpan, or urinal?: A Little Help from another person bathing (including washing, rinsing, drying)?: A Lot Help from another person to put on and taking off regular upper body clothing?: A Little Help from another person to put on and taking off regular lower body clothing?: A Lot 6 Click Score: 18   End of Session Equipment Utilized During Treatment: Gait belt;Rolling walker (2 wheels) Nurse Communication: Mobility status  Activity Tolerance: Patient tolerated treatment well Patient left: in bed;with call bell/phone within reach;with family/visitor present  OT Visit Diagnosis: Unsteadiness on feet (R26.81);Other abnormalities of gait and mobility (R26.89);Repeated falls (R29.6);Muscle weakness (generalized) (M62.81);History of falling (Z91.81);Pain Pain - Right/Left: Right Pain - part of body: Leg                Time: 6962-9528 OT Time Calculation (min): 29 min Charges:  OT General Charges $OT Visit: 1 Visit OT Evaluation $OT Eval Low Complexity: 1 Low OT Treatments $Self Care/Home Management : 8-22 mins  Presley Raddle OTR/L  Acute Rehab Services  [phone] office number   Alphia Moh 09/25/2023, 1:11 PM

## 2023-09-25 NOTE — Evaluation (Signed)
Physical Therapy Evaluation Patient Details Name: Tanner Phillips MRN: [id number] DOB: 1956-08-09 Today's Date: 09/25/2023  History of Present Illness  Pt is a 67 yo male admitted 12/16 for Right CFA to peroneal BPG, right peroneal endarterectomy and left heel debridement.Marland Kitchen PMHx: LLE DVT, 08/13/23 Lt common femoral to ant tib BPG. 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 ruptured disk in back, HTN, CAD s/p MI  Clinical Impression  Pt pleasant willing to get OOB and reports left heel and right thigh pain with increase from 6-9 with mobility and RN aware and providing medication during session. Pt slightly impulsive with gait pushing RW away as he fatigues and rushing to surface with assist for safety and control. Pt with decreased strength, transfers, gait and safety who will benefit from acute therapy to maximize mobility and independence. Wife is a retired [employer] fundraiser and pt was receiving HHPT PTA with plan to resume these services.          If plan is discharge home, recommend the following: A little help with walking and/or transfers;A little help with bathing/dressing/bathroom;Assistance with cooking/housework;Assist for transportation;Help with stairs or ramp for entrance   Can travel by private vehicle        Equipment Recommendations None recommended by PT  Recommendations for Other Services       Functional Status Assessment Patient has had a recent decline in their functional status and demonstrates the ability to make significant improvements in function in a reasonable and predictable amount of time.     Precautions / Restrictions Precautions Precautions: Fall Restrictions Weight Bearing Restrictions Per Provider Order: Yes RLE Weight Bearing Per Provider Order: Weight bearing as tolerated LLE Weight Bearing Per Provider Order: Weight bearing as tolerated      Mobility  Bed Mobility Overal bed mobility: Needs Assistance Bed Mobility: Supine to Sit     Supine to sit: HOB elevated, Used  rails, Min assist     General bed mobility comments: increased time, HOB 40 degrees, cues for sequence with assist to move LLE toward EOB    Transfers Overall transfer level: Needs assistance   Transfers: Sit to/from Stand Sit to Stand: Contact guard assist           General transfer comment: cues for hand placement and safety. Pt attempting to sit prematurely with max cues for safety and backing fully to surface prior to sitting. increased time and UB support to scoot back in chair and at EOB    Ambulation/Gait Ambulation/Gait assistance: Min assist Gait Distance (Feet): 30 Feet Assistive device: Rolling walker (2 wheels) Gait Pattern/deviations: Step-through pattern, Decreased stride length, Trunk flexed, Wide base of support   Gait velocity interpretation: <1.8 ft/sec, indicate of risk for recurrent falls   General Gait Details: pt with wide BOS, short shuffling steps with max cues for posture, proximity to RW and safety. With fatigue pt pushes RW even further anteriorly and rushes to surface needing assist to control RW and max cues for safety  Stairs            Wheelchair Mobility     Tilt Bed    Modified Rankin (Stroke Patients Only)       Balance Overall balance assessment: Needs assistance Sitting-balance support: No upper extremity supported, Feet supported Sitting balance-Leahy Scale: Fair     Standing balance support: Bilateral upper extremity supported Standing balance-Leahy Scale: Poor Standing balance comment: reliant on RW in standing  Pertinent Vitals/Pain Pain Assessment Pain Assessment: 0-10 Pain Score: 6  Pain Location: left heel Pain Descriptors / Indicators: Aching Pain Intervention(s): Limited activity within patient's tolerance, Repositioned, Monitored during session    Home Living Family/patient expects to be discharged to:: Private residence Living Arrangements: Spouse/significant  other Available Help at Discharge: Family;Available 24 hours/day Type of Home: House Home Access: Stairs to enter   [employer] of Steps: 2   Home Layout: One level Home Equipment: Agricultural consultant (2 wheels);Wheelchair - Psychologist, sport and exercise (4 wheels);Transport chair;BSC/3in1      Prior Function Prior Level of Function : Independent/Modified Independent             Mobility Comments: independent       Extremity/Trunk Assessment   Upper Extremity Assessment Upper Extremity Assessment: Generalized weakness    Lower Extremity Assessment Lower Extremity Assessment: Generalized weakness    Cervical / Trunk Assessment Cervical / Trunk Assessment: Kyphotic  Communication   Communication Communication: No apparent difficulties  Cognition Arousal: Alert Behavior During Therapy: WFL for tasks assessed/performed Overall Cognitive Status: Within Functional Limits for tasks assessed                                          General Comments      Exercises     Assessment/Plan    PT Assessment Patient needs continued PT services  PT Problem List Decreased strength;Decreased mobility;Decreased activity tolerance;Decreased balance;Decreased knowledge of use of DME;Pain       PT Treatment Interventions DME instruction;Functional mobility training;Therapeutic activities;Patient/family education;Therapeutic exercise;Stair training;Gait training    PT Goals (Current goals can be found in the Care Plan section)  Acute Rehab PT Goals Patient Stated Goal: return home, go to [place] in Feb PT Goal Formulation: With patient/family Time For Goal Achievement: 10/09/23 Potential to Achieve Goals: Good    Frequency Min 1X/week     Co-evaluation               AM-PAC PT "6 Clicks" Mobility  Outcome Measure Help needed turning from your back to your side while in a flat bed without using bedrails?: A Little Help needed moving from  lying on your back to sitting on the side of a flat bed without using bedrails?: A Little Help needed moving to and from a bed to a chair (including a wheelchair)?: A Little Help needed standing up from a chair using your arms (e.g., wheelchair or bedside chair)?: A Little Help needed to walk in hospital room?: A Little Help needed climbing 3-5 steps with a railing? : A Lot 6 Click Score: 17    End of Session   Activity Tolerance: Patient tolerated treatment well Patient left: in chair;with call bell/phone within reach;with nursing/sitter in room;with family/visitor present Nurse Communication: Mobility status PT Visit Diagnosis: Other abnormalities of gait and mobility (R26.89);Muscle weakness (generalized) (M62.81)    Time: 2952-8413 PT Time Calculation (min) (ACUTE ONLY): 25 min   Charges:   PT Evaluation $PT Eval Moderate Complexity: 1 Mod PT Treatments $Gait Training: 8-22 mins PT General Charges $$ ACUTE PT VISIT: 1 Visit         Merryl Hacker, PT Acute Rehabilitation Services Office: [phone]   Enedina Finner Chandra Feger 09/25/2023, 10:56 AM

## 2023-09-26 ENCOUNTER — Other Ambulatory Visit: Payer: Self-pay

## 2023-09-26 LAB — POCT I-STAT 7, (LYTES, BLD GAS, ICA,H+H)
Acid-base deficit: 5 mmol/L — ABNORMAL HIGH (ref 0.0–2.0)
Bicarbonate: 20.4 mmol/L (ref 20.0–28.0)
Calcium, Ion: 1.16 mmol/L (ref 1.15–1.40)
HCT: 27 % — ABNORMAL LOW (ref 39.0–52.0)
Hemoglobin: 9.2 g/dL — ABNORMAL LOW (ref 13.0–17.0)
O2 Saturation: 99 %
Potassium: 4.2 mmol/L (ref 3.5–5.1)
Sodium: 142 mmol/L (ref 135–145)
TCO2: 21 mmol/L — ABNORMAL LOW (ref 22–32)
pCO2 arterial: 35.7 mm[Hg] (ref 32–48)
pH, Arterial: 7.365 (ref 7.35–7.45)
pO2, Arterial: 151 mm[Hg] — ABNORMAL HIGH (ref 83–108)

## 2023-09-26 LAB — BASIC METABOLIC PANEL
Anion gap: 8 (ref 5–15)
BUN: 17 mg/dL (ref 8–23)
CO2: 22 mmol/L (ref 22–32)
Calcium: 8.2 mg/dL — ABNORMAL LOW (ref 8.9–10.3)
Chloride: 106 mmol/L (ref 98–111)
Creatinine, Ser: 1.89 mg/dL — ABNORMAL HIGH (ref 0.61–1.24)
GFR, Estimated: 38 mL/min — ABNORMAL LOW (ref >60)
Glucose, Bld: 138 mg/dL — ABNORMAL HIGH (ref 70–99)
Potassium: 3.9 mmol/L (ref 3.5–5.1)
Sodium: 136 mmol/L (ref 135–145)

## 2023-09-26 LAB — CBC
HCT: 28.2 % — ABNORMAL LOW (ref 39.0–52.0)
Hemoglobin: 8.9 g/dL — ABNORMAL LOW (ref 13.0–17.0)
MCH: 28.5 pg (ref 26.0–34.0)
MCHC: 31.6 g/dL (ref 30.0–36.0)
MCV: 90.4 fL (ref 80.0–100.0)
Platelets: 280 10*3/uL (ref 150–400)
RBC: 3.12 MIL/uL — ABNORMAL LOW (ref 4.22–5.81)
RDW: 14.8 % (ref 11.5–15.5)
WBC: 12.6 10*3/uL — ABNORMAL HIGH (ref 4.0–10.5)
nRBC: 0 % (ref 0.0–0.2)

## 2023-09-26 LAB — POCT ACTIVATED CLOTTING TIME
Activated Clotting Time: 204 s
Activated Clotting Time: 227 s
Activated Clotting Time: 239 s
Activated Clotting Time: 239 s

## 2023-09-26 LAB — APTT: aPTT: 29 s (ref 24–36)

## 2023-09-26 MED ORDER — IBUPROFEN 400 MG PO TABS: 600.0000 mg | ORAL_TABLET | ORAL | Status: DC | PRN

## 2023-09-26 MED ORDER — SODIUM CHLORIDE 0.9 % IV SOLN: INTRAVENOUS | Status: AC

## 2023-09-26 NOTE — Progress Notes (Signed)
Occupational Therapy Treatment Patient Details Name: Tanner Phillips MRN: [id number] DOB: July 07, 1956 Today's Date: 09/26/2023   History of present illness Pt is a 67 yo male admitted 12/16 for Right CFA to peroneal BPG, right peroneal endarterectomy and left heel debridement.Marland Kitchen PMHx: LLE DVT, 08/13/23 Lt common femoral to ant tib BPG. 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 ruptured disk in back, HTN, CAD s/p MI   OT comments  Pt presented in bed and wife was present throughout the session. Tanner Phillips was able to complete bed mobility with supervision with bed rail to L side and HOB slight elevated (uses multiple pillows behind head at baseline) with min to mod cues on hand placement. Pt completed multiple sit to stand transfers with min assist to min guard with mod to max cues. He also required max cues to remain awake in session and working on breathing patterns as would decrease into 80% when on RA. Pt then was placed on 1L via [place] and when talking with therapist they were able to remain in 90%. Nursing was made aware as entered into the room and informed about pt having some drainage from RLE. Pt and wife would like to still work towards [facility] level of care at this time and declining any SNF stay.       If plan is discharge home, recommend the following:  A little help with walking and/or transfers;A little help with bathing/dressing/bathroom;Assistance with cooking/housework;Assist for transportation   Equipment Recommendations  BSC/3in1    Recommendations for Other Services      Precautions / Restrictions Precautions Precautions: Fall Restrictions Weight Bearing Restrictions Per Provider Order: Yes RLE Weight Bearing Per Provider Order: Weight bearing as tolerated LLE Weight Bearing Per Provider Order: Weight bearing as tolerated       Mobility Bed Mobility Overal bed mobility: Needs Assistance Bed Mobility: Rolling, Supine to Sit Rolling: Supervision   Supine to sit: Supervision, HOB  elevated, Used rails (required rail on L side which pt was educated about having rail on that side of home and pt will have muliplt pillows under head at night)          Transfers Overall transfer level: Needs assistance Equipment used: Rolling walker (2 wheels) Transfers: Sit to/from Stand Sit to Stand: Min assist, Contact guard assist           General transfer comment: Pt needs increase in time     Balance Overall balance assessment: Needs assistance Sitting-balance support: Feet supported, Bilateral upper extremity supported, Single extremity supported Sitting balance-Leahy Scale: Fair     Standing balance support: Bilateral upper extremity supported, Single extremity supported Standing balance-Leahy Scale: Fair                             ADL either performed or assessed with clinical judgement   ADL Overall ADL's : Needs assistance/impaired Eating/Feeding: Set up;Sitting   Grooming: Wash/dry hands;Wash/dry face;Set up;Sitting   Upper Body Bathing: Set up;Sitting   Lower Body Bathing: Moderate assistance;Sit to/from stand;Sitting/lateral leans;Cueing for sequencing;Cueing for safety   Upper Body Dressing : Set up;Sitting   Lower Body Dressing: Moderate assistance;Cueing for safety;Cueing for sequencing;Sit to/from stand   Toilet Transfer: Minimal assistance;Cueing for safety;Cueing for sequencing;Rolling walker (2 wheels);BSC/3in1   Toileting- Clothing Manipulation and Hygiene: Minimal assistance;Cueing for safety;Cueing for sequencing;Sit to/from stand       Functional mobility during ADLs: Minimal assistance;Rolling walker (2 wheels);Cueing for sequencing;Cueing for safety  Extremity/Trunk Assessment Upper Extremity Assessment Upper Extremity Assessment: Generalized weakness   Lower Extremity Assessment Lower Extremity Assessment: Defer to PT evaluation        Vision   Vision Assessment?: No apparent visual deficits   Perception      Praxis      Cognition Arousal: Alert Behavior During Therapy: WFL for tasks assessed/performed Overall Cognitive Status: Within Functional Limits for tasks assessed                                          Exercises      Shoulder Instructions       General Comments      Pertinent Vitals/ Pain       Pain Assessment Pain Assessment: Faces Faces Pain Scale: Hurts a little bit Pain Location: LLE and RLE with sit to stadn transfers Pain Descriptors / Indicators: Discomfort, Grimacing  Home Living                                          Prior Functioning/Environment              Frequency  Min 1X/week        Progress Toward Goals  OT Goals(current goals can now be found in the care plan section)  Progress towards OT goals: Progressing toward goals  Acute Rehab OT Goals Patient Stated Goal: to walk more OT Goal Formulation: With patient Time For Goal Achievement: 10/09/23 Potential to Achieve Goals: Good ADL Goals Pt Will Perform Grooming: with modified independence;standing Pt Will Perform Upper Body Dressing: with modified independence;standing Pt Will Perform Lower Body Dressing: with modified independence;with adaptive equipment;sit to/from stand Pt Will Transfer to Toilet: with modified independence;ambulating;regular height toilet  Plan      Co-evaluation                 AM-PAC OT "6 Clicks" Daily Activity     Outcome Measure   Help from another person eating meals?: None Help from another person taking care of personal grooming?: None Help from another person toileting, which includes using toliet, bedpan, or urinal?: A Little Help from another person bathing (including washing, rinsing, drying)?: A Lot Help from another person to put on and taking off regular upper body clothing?: A Little Help from another person to put on and taking off regular lower body clothing?: A Lot 6 Click Score: 18     End of Session Equipment Utilized During Treatment: Gait belt;Rolling walker (2 wheels)  OT Visit Diagnosis: Unsteadiness on feet (R26.81);Other abnormalities of gait and mobility (R26.89);Repeated falls (R29.6);Muscle weakness (generalized) (M62.81);History of falling (Z91.81);Pain Pain - Right/Left: Right Pain - part of body: Leg   Activity Tolerance Patient limited by lethargy   Patient Left in chair;with call bell/phone within reach;with family/visitor present   Nurse Communication Mobility status        Time: 1610-9604 OT Time Calculation (min): 49 min  Charges: OT General Charges $OT Visit: 1 Visit OT Treatments $Self Care/Home Management : 38-52 mins  Presley Raddle OTR/L  Acute Rehab Services  [phone] office number   Alphia Moh 09/26/2023, 10:51 AM

## 2023-09-26 NOTE — Progress Notes (Addendum)
Progress Note    09/26/2023 7:52 AM 2 Days Post-Op  Subjective:  says his right foot is hurting; wants to go to [place] in February for the race but looking at the July race.   Afebrile HR 90's-100's  130's-140's systolic 91% RA  Vitals:   09/26/23 0356 09/26/23 0749  BP: 137/64 139/66  Pulse: 100 98  Resp: 13 (!) 21  Temp: 98.4 F (36.9 C) 98 F (36.7 C)  SpO2: 97% 91%    Physical Exam: General:  no distress Cardiac:  regular Lungs:  non labored Incisions:  right groin and leg incisions are clean.  Superficial dehiscence of proximal thigh incision.   Extremities:  palpable left DP pulse; brisk right peroneal doppler signal  Left leg       Abdomen:  soft  CBC    Component Value Date/Time   WBC 12.6 (H) 09/26/2023 0348   RBC 3.12 (L) 09/26/2023 0348   HGB 8.9 (L) 09/26/2023 0348   HCT 28.2 (L) 09/26/2023 0348   PLT 280 09/26/2023 0348   MCV 90.4 09/26/2023 0348   MCH 28.5 09/26/2023 0348   MCHC 31.6 09/26/2023 0348   RDW 14.8 09/26/2023 0348   LYMPHSABS 1.2 08/06/2023 0927   MONOABS 1.3 (H) 08/06/2023 0927   EOSABS 0.1 08/06/2023 0927   BASOSABS 0.1 08/06/2023 0927    BMET    Component Value Date/Time   NA 136 09/26/2023 0348   K 3.9 09/26/2023 0348   CL 106 09/26/2023 0348   CO2 22 09/26/2023 0348   GLUCOSE 138 (H) 09/26/2023 0348   BUN 17 09/26/2023 0348   CREATININE 1.89 (H) 09/26/2023 0348   CALCIUM 8.2 (L) 09/26/2023 0348   GFRNONAA 38 (L) 09/26/2023 0348   GFRAA 52 (L) 05/21/2017 0010    INR    Component Value Date/Time   INR 1.0 09/24/2023 0911     Intake/Output Summary (Last 24 hours) at 09/26/2023 0752 Last data filed at 09/26/2023 0501 Gross per 24 hour  Intake 600.75 ml  Output 450 ml  Net 150.75 ml      Assessment/Plan:  67 y.o. male is s/p:  Right CFA to peroneal bypass with ipsilateral reversed GSV through saphenectomy tunnel and right peroneal endarterectomy and left heel debridement   2 Days  Post-Op   -brisk doppler flow right peroneal and palpable left DP pulse.  Has pain on top of right foot-most likely due to reperfusion.   -proximal right thigh incision with superficial dehiscence.  Steri strips placed above and below dehiscence and continue wet to dry dressing.  Dry gauze to right groin to wick moisture.   -left heel appears improved from pictures taken a couple of weeks ago.  More eschar debrided at bedside by Dr. Chestine Spore.  Left leg incisions also with healthy granulation tissue.  -AKI-creatinine up to 1.89 from 1.64 yesterday.  May need some IV hydration.  Ibuprofen on med list and I have discontinued this.  He has not had any since hospitalization. -DVT prophylaxis:  sq heparin -mobilize today   Doreatha Massed, PA-C [facility] [phone] 09/26/2023 7:52 AM  I have seen and evaluated the patient. I agree with the PA note as documented above.  Postop day 2 status post right common femoral to peroneal artery bypass for CLI with tissue loss.  Continues to have a palpable pulse in the bypass graft with a brisk triphasic peroneal and dorsalis pedis signal.  Had a little dehiscence superficially of one of the saphenectomy incisions  in the right thigh and will do wet-to-dry dressing and have put Steri-Strips.  All dressings changed to the left lower extremity where he had a prior anterior tibial bypass also for tissue loss.  Out of bed and mobilize.  Will gently hydrate and recheck creatinine tomorrow.  Has CKD at baseline.  Cephus Shelling, MD [facility] [place] Office: [phone]

## 2023-09-26 NOTE — Progress Notes (Signed)
Physical Therapy Treatment Patient Details Name: Tanner Phillips MRN: [id number] DOB: [birth year] Today's Date: 09/26/2023   History of Present Illness Pt is a 67 yo male admitted 12/16 for Right CFA to peroneal BPG, right peroneal endarterectomy and left heel debridement.Marland Kitchen PMHx: LLE DVT, 08/13/23 Lt common femoral to ant tib BPG. 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 ruptured disk in back, HTN, CAD s/p MI    PT Comments  On arrival to the room, patient in chair and requesting to return to bed. Spouse reports patient had just woke from sleeping and was attempting to get up without assistance, she suspects some confusion and lethargy from morning medications. Cues required for safety and for sequencing for stand step transfer from chair to bed and for bed mobility. Min A required. Further progression of activity deferred for safety due to lethargy. Recommend to continue PT to maximize independence and facilitate return to prior level of function. They are still planning for discharge home with family support at this time.    If plan is discharge home, recommend the following: A little help with walking and/or transfers;A little help with bathing/dressing/bathroom;Assistance with cooking/housework;Assist for transportation;Help with stairs or ramp for entrance   Can travel by private vehicle        Equipment Recommendations  None recommended by PT    Recommendations for Other Services       Precautions / Restrictions Precautions Precautions: Fall Restrictions Weight Bearing Restrictions Per Provider Order: Yes RLE Weight Bearing Per Provider Order: Weight bearing as tolerated LLE Weight Bearing Per Provider Order: Weight bearing as tolerated     Mobility  Bed Mobility Overal bed mobility: Needs Assistance Bed Mobility: Sit to Supine       Sit to supine: Min assist   General bed mobility comments: assistance for LLE support. cues for technique    Transfers Overall transfer level: Needs  assistance Equipment used: Rolling walker (2 wheels) Transfers: Bed to chair/wheelchair/BSC     Step pivot transfers: Min assist       General transfer comment: steadying assistance required with Mod - Max cues for sequencing and technique. increased time and effort required    Ambulation/Gait               General Gait Details: ambulation deferred secondary to lethargy. patient requesting to return to bed to sleep after sitting up in chair   Stairs             Wheelchair Mobility     Tilt Bed    Modified Rankin (Stroke Patients Only)       Balance Overall balance assessment: Needs assistance Sitting-balance support: Feet supported, Bilateral upper extremity supported, Single extremity supported Sitting balance-Leahy Scale: Fair     Standing balance support: Bilateral upper extremity supported, Single extremity supported Standing balance-Leahy Scale: Poor Standing balance comment: external support required                            Cognition Arousal: Lethargic, Suspect due to medications Behavior During Therapy: Impulsive Overall Cognitive Status: Impaired/Different from baseline                                 General Comments: spouse reports patient is lethargic from medications. he had just woke from being asleep and was trying to stand without assistance per the spouse, prior to therapist arrival. difficulty sequencing tasks today  without cues.        Exercises      General Comments General comments (skin integrity, edema, etc.): vitals stable throughout session      Pertinent Vitals/Pain Pain Assessment Pain Assessment: Faces Faces Pain Scale: Hurts a little bit Pain Location: RLE Pain Descriptors / Indicators: Discomfort Pain Intervention(s): Limited activity within patient's tolerance, Monitored during session    Home Living                          Prior Function            PT Goals  (current goals can now be found in the care plan section) Acute Rehab PT Goals Patient Stated Goal: reutrn home PT Goal Formulation: With patient/family Time For Goal Achievement: 10/09/23 Potential to Achieve Goals: Good Progress towards PT goals: Progressing toward goals    Frequency    Min 1X/week      PT Plan      Co-evaluation              AM-PAC PT "6 Clicks" Mobility   Outcome Measure  Help needed turning from your back to your side while in a flat bed without using bedrails?: A Little Help needed moving from lying on your back to sitting on the side of a flat bed without using bedrails?: A Little Help needed moving to and from a bed to a chair (including a wheelchair)?: A Little Help needed standing up from a chair using your arms (e.g., wheelchair or bedside chair)?: A Little Help needed to walk in hospital room?: A Little Help needed climbing 3-5 steps with a railing? : A Lot 6 Click Score: 17    End of Session   Activity Tolerance: Patient limited by fatigue Patient left: in bed;with call bell/phone within reach;with bed alarm set;with family/visitor present   PT Visit Diagnosis: Other abnormalities of gait and mobility (R26.89);Muscle weakness (generalized) (M62.81)     Time: 0865-7846 PT Time Calculation (min) (ACUTE ONLY): 12 min  Charges:    $Therapeutic Activity: 8-22 mins PT General Charges $$ ACUTE PT VISIT: 1 Visit                     Donna Bernard, PT, MPT    Ina Homes 09/26/2023, 2:01 PM

## 2023-09-26 NOTE — Progress Notes (Signed)
With two assist, moved patient from chair to bed. Changed dressing on new wound on right thigh.  Dressing had large amount of serosanguinous drainage.  Will continue to monitor.  Harriet Masson, RN

## 2023-09-26 NOTE — Progress Notes (Signed)
With two person assist the patient was transferred from the bed to the chair to change his wet bed sheets.  While the patient was standing up, noticed drops of blood on the floor. Once the patient was in the chair, the nurse assessed him and saw that the bottom incision on the right inner thigh had opened up.  The new wound was not bleeding very much.  The nurse packed the opened incision with moist 2x2 gauze and covered with a dry 4x4.  Patient is currently reclining in his chair.  Will continue to monitor.  Harriet Masson, RN

## 2023-09-27 LAB — BASIC METABOLIC PANEL
Anion gap: 7 (ref 5–15)
BUN: 17 mg/dL (ref 8–23)
CO2: 21 mmol/L — ABNORMAL LOW (ref 22–32)
Calcium: 8.1 mg/dL — ABNORMAL LOW (ref 8.9–10.3)
Chloride: 107 mmol/L (ref 98–111)
Creatinine, Ser: 1.64 mg/dL — ABNORMAL HIGH (ref 0.61–1.24)
GFR, Estimated: 45 mL/min — ABNORMAL LOW (ref >60)
Glucose, Bld: 112 mg/dL — ABNORMAL HIGH (ref 70–99)
Potassium: 3.4 mmol/L — ABNORMAL LOW (ref 3.5–5.1)
Sodium: 135 mmol/L (ref 135–145)

## 2023-09-27 LAB — CBC
HCT: 27.1 % — ABNORMAL LOW (ref 39.0–52.0)
Hemoglobin: 8.5 g/dL — ABNORMAL LOW (ref 13.0–17.0)
MCH: 28 pg (ref 26.0–34.0)
MCHC: 31.4 g/dL (ref 30.0–36.0)
MCV: 89.1 fL (ref 80.0–100.0)
Platelets: 291 10*3/uL (ref 150–400)
RBC: 3.04 MIL/uL — ABNORMAL LOW (ref 4.22–5.81)
RDW: 14.6 % (ref 11.5–15.5)
WBC: 12.4 10*3/uL — ABNORMAL HIGH (ref 4.0–10.5)
nRBC: 0 % (ref 0.0–0.2)

## 2023-09-27 LAB — APTT: aPTT: 32 s (ref 24–36)

## 2023-09-27 NOTE — Progress Notes (Signed)
Occupational Therapy Treatment Patient Details Name: Tanner Phillips MRN: [id number] DOB: October 01, 1956 Today's Date: 09/27/2023   History of present illness Pt is a 67 yo male admitted 12/16 for Right CFA to peroneal BPG, right peroneal endarterectomy and left heel debridement.Marland Kitchen PMHx: LLE DVT, 08/13/23 Lt common femoral to ant tib BPG. 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 ruptured disk in back, HTN, CAD s/p MI   OT comments  Pt presented with wife in the room and agreeable to session. Attempted to complete bed mobility with no bed rail but still required min assist for supine to sitting. Attempted multiple attempts with sit to stand transfers in session but pt had difficulties with sequencing and coordination of placement of BUE hands and feet and noted to become frustrated on how he can not do simple tasks. Pt was redirected to complete hygiene tasks while sitting at EOB with set up and then re attempted transfers. He then was able to complete with min to mod assist and then completed step pivot with min assist to chair. Pt and family would like to continue to work towards going home with [facility] therapy.      If plan is discharge home, recommend the following:  A little help with walking and/or transfers;A little help with bathing/dressing/bathroom;Assistance with cooking/housework;Assist for transportation   Equipment Recommendations  BSC/3in1    Recommendations for Other Services      Precautions / Restrictions Precautions Precautions: Fall Restrictions Weight Bearing Restrictions Per Provider Order: Yes RLE Weight Bearing Per Provider Order: Weight bearing as tolerated LLE Weight Bearing Per Provider Order: Weight bearing as tolerated       Mobility Bed Mobility Overal bed mobility: Needs Assistance Bed Mobility: Supine to Sit Rolling: Min assist   Supine to sit: Min assist     General bed mobility comments: attempted today without use of bed rail and HOB elevated    Transfers Overall  transfer level: Needs assistance Equipment used: Rolling walker (2 wheels) Transfers: Sit to/from Stand Sit to Stand: Mod assist, Max assist     Step pivot transfers: Min assist     General transfer comment: Pt today needed increase in assist for first several transfers as only wanted to pull up onto walker even with cueing pt  then would get frustrated and then needed to sit back down in session     Balance Overall balance assessment: Needs assistance Sitting-balance support: Feet supported, Bilateral upper extremity supported, Single extremity supported Sitting balance-Leahy Scale: Good     Standing balance support: Bilateral upper extremity supported Standing balance-Leahy Scale: Fair Standing balance comment: Pt unable to complete any unspoorted activity today                           ADL either performed or assessed with clinical judgement   ADL Overall ADL's : Needs assistance/impaired Eating/Feeding: Set up;Sitting   Grooming: Wash/dry hands;Wash/dry face;Oral care;Set up;Sitting   Upper Body Bathing: Set up;Sitting   Lower Body Bathing: Maximal assistance;Sit to/from stand;Moderate assistance   Upper Body Dressing : Set up;Sitting   Lower Body Dressing: Moderate assistance;Maximal assistance;Sit to/from stand   Toilet Transfer: Minimal assistance;Cueing for safety;Cueing for sequencing;Rolling walker (2 wheels)   Toileting- Clothing Manipulation and Hygiene: Maximal assistance;Sit to/from stand       Functional mobility during ADLs: Minimal assistance;Rolling walker (2 wheels);Cueing for sequencing;Cueing for safety      Extremity/Trunk Assessment Upper Extremity Assessment Upper Extremity Assessment: Generalized weakness  Lower Extremity Assessment Lower Extremity Assessment: Defer to PT evaluation        Vision   Vision Assessment?: No apparent visual deficits   Perception     Praxis      Cognition Arousal: Alert Behavior During  Therapy: Impulsive, Anxious Overall Cognitive Status: Impaired/Different from baseline Area of Impairment: Safety/judgement, Problem solving, Following commands                       Following Commands: Follows one step commands inconsistently Safety/Judgement: Decreased awareness of safety   Problem Solving: Slow processing General Comments: spouse present throughout session and involved in care she would nod head as pt was giving some incorrect information on how the night went fot the pt        Exercises      Shoulder Instructions       General Comments Pt able to remain in 90% on RA today    Pertinent Vitals/ Pain       Pain Assessment Pain Assessment: Faces Faces Pain Scale: Hurts little more Pain Location: BLE Pain Descriptors / Indicators: Discomfort Pain Intervention(s): Limited activity within patient's tolerance, Monitored during session  Home Living                                          Prior Functioning/Environment              Frequency  Min 1X/week        Progress Toward Goals  OT Goals(current goals can now be found in the care plan section)  Progress towards OT goals: Progressing toward goals  Acute Rehab OT Goals Patient Stated Goal: to do more OT Goal Formulation: With patient Time For Goal Achievement: 10/09/23 Potential to Achieve Goals: Good ADL Goals Pt Will Perform Grooming: with modified independence;standing Pt Will Perform Upper Body Dressing: with modified independence;standing Pt Will Perform Lower Body Dressing: with modified independence;with adaptive equipment;sit to/from stand Pt Will Transfer to Toilet: with modified independence;ambulating;regular height toilet  Plan      Co-evaluation                 AM-PAC OT "6 Clicks" Daily Activity     Outcome Measure   Help from another person eating meals?: None Help from another person taking care of personal grooming?: None Help from  another person toileting, which includes using toliet, bedpan, or urinal?: A Little Help from another person bathing (including washing, rinsing, drying)?: A Lot Help from another person to put on and taking off regular upper body clothing?: A Little Help from another person to put on and taking off regular lower body clothing?: A Lot 6 Click Score: 18    End of Session Equipment Utilized During Treatment: Gait belt;Rolling walker (2 wheels)  OT Visit Diagnosis: Unsteadiness on feet (R26.81);Other abnormalities of gait and mobility (R26.89);Repeated falls (R29.6);Muscle weakness (generalized) (M62.81);History of falling (Z91.81);Pain Pain - Right/Left: Right Pain - part of body: Leg   Activity Tolerance Patient limited by fatigue;Other (comment) (was becoming easily frustrated with not being able to do what they want to complete)   Patient Left in chair;with call bell/phone within reach;with chair alarm set;with family/visitor present   Nurse Communication Mobility status        Time: 0826-0908 OT Time Calculation (min): 42 min  Charges: OT General Charges $OT Visit: 1 Visit OT Treatments $  Self Care/Home Management : 38-52 mins  Presley Raddle OTR/L  Acute Rehab Services  [phone] office number   Alphia Moh 09/27/2023, 10:16 AM

## 2023-09-27 NOTE — Progress Notes (Addendum)
Progress Note    09/27/2023 6:40 AM 3 Days Post-Op  Subjective:  wife at bedside and concerned that pt has been incontinent and she is not able to get the urinal in time and she is worried about wound getting infected.  She states that he is not himself and sleepy.  [place] been in [place] or been very sleepy with past surgeries. She states he does take ambien at home and did receive it last evening  Afebrile HR 99's-110's  120's-160's systolic 97% 2LO2NC  Vitals:   09/27/23 0244 09/27/23 0317  BP: (!) 168/86 128/68  Pulse: 99 99  Resp: 20 (!) 24  Temp:  97.8 F (36.6 C)  SpO2: 98% 100%    Physical Exam: General:  no distress Cardiac:  regular Lungs:  non labored Incisions:  right groin incision is clean with dry gauze in groin; right proximal thigh wound in clean.  Distal right leg incisions are healing nicely.   Extremities:  brisk peroneal doppler signal on the right.   Abdomen:  soft  CBC    Component Value Date/Time   WBC 12.4 (H) 09/27/2023 0324   RBC 3.04 (L) 09/27/2023 0324   HGB 8.5 (L) 09/27/2023 0324   HCT 27.1 (L) 09/27/2023 0324   PLT 291 09/27/2023 0324   MCV 89.1 09/27/2023 0324   MCH 28.0 09/27/2023 0324   MCHC 31.4 09/27/2023 0324   RDW 14.6 09/27/2023 0324   LYMPHSABS 1.2 08/06/2023 0927   MONOABS 1.3 (H) 08/06/2023 0927   EOSABS 0.1 08/06/2023 0927   BASOSABS 0.1 08/06/2023 0927    BMET    Component Value Date/Time   NA 135 09/27/2023 0324   K 3.4 (L) 09/27/2023 0324   CL 107 09/27/2023 0324   CO2 21 (L) 09/27/2023 0324   GLUCOSE 112 (H) 09/27/2023 0324   BUN 17 09/27/2023 0324   CREATININE 1.64 (H) 09/27/2023 0324   CALCIUM 8.1 (L) 09/27/2023 0324   GFRNONAA 45 (L) 09/27/2023 0324   GFRAA 52 (L) 05/21/2017 0010    INR    Component Value Date/Time   INR 1.0 09/24/2023 0911     Intake/Output Summary (Last 24 hours) at 09/27/2023 0640 Last data filed at 09/27/2023 0413 Gross per 24 hour  Intake 784.33 ml  Output 1250  ml  Net -465.67 ml      Assessment/Plan:  67 y.o. male is s/p:  Right CFA to peroneal bypass with ipsilateral reversed GSV through saphenectomy tunnel and right peroneal endarterectomy and left heel debridement   3 Days Post-Op   -pt with brisk right peroneal doppler signal. -pt very sleepy-has flexeril, ambien and norco ordered as well as gabapentin.  Will stop the flexeril and ambien for now.   -pt with some incontinence.  Could be due to narcotics effect.  I have asked the nurse to place pure wick.  He does have some scrotal swelling, which she will elevate with towel.   -creatinine down to 1.64 -DVT prophylaxis:  heparin at 500u/hr -wet to dry dressing to left heel and wounds on left leg.  Orders placed.  -PT/OT recommending HHPT/OT.  TOC consult and face to face order placed.    Doreatha Massed, PA-C [facility] [phone] 09/27/2023 6:40 AM  I have seen and evaluated the patient. I agree with the PA note as documented above.  Postop day 3 status post a right common femoral to peroneal bypass with ipsilateral reversed great saphenous vein with peroneal endarterectomy.  Still has  a palpable pulse in the bypass graft.  Brisk peroneal signal at the ankle that is triphasic.  Continue heparin for DVT prophylaxis.  Creatinine back to baseline at 1.64.  Change his wet to dry dressing in the right saphenectomy incision that has dehisced.  Out of bed and work with therapy.  Will hold his Flexeril and Ambien for now given concern for being very sleepy.  Cephus Shelling, MD [facility] [place] Office: [phone]

## 2023-09-27 NOTE — Care Management Important Message (Signed)
Important Message  Patient Details  Name: Tanner Phillips MRN: [id number] Date of Birth: 1956/06/23   Important Message Given:  Yes - Medicare IM     Renie Ora 09/27/2023, 1:12 PM

## 2023-09-27 NOTE — Progress Notes (Signed)
Physical Therapy Treatment Patient Details Name: Tanner Phillips MRN: [id number] DOB: 1956/01/12 Today's Date: 09/27/2023   History of Present Illness Pt is a 67 yo male admitted 12/16 for Right CFA to peroneal BPG, right peroneal endarterectomy and left heel debridement.Marland Kitchen PMHx: LLE DVT, 08/13/23 Lt common femoral to ant tib BPG. 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 ruptured disk in back, HTN, CAD s/p MI    PT Comments  Pt is presenting below baseline level of functioning. Pt is slightly self limiting due to pain and fear of falling. Pt is CGA to Min A for sit to stand and transfers with RW. Spouse is present and very supportive/involved in care. Pt requires Min A for sitting to supine but with encouragement is able to lift LLE without assistance. Due to pt current functional status, home set up and available assistance at home recommending skilled physical therapy services 3x/week in order to address strength, balance and functional mobility to decrease risk for falls, injury and re-hospitalization.       If plan is discharge home, recommend the following: A little help with walking and/or transfers;Assistance with cooking/housework;Assist for transportation;Help with stairs or ramp for entrance     Equipment Recommendations  None recommended by PT       Precautions / Restrictions Precautions Precautions: Fall Restrictions Weight Bearing Restrictions Per Provider Order: Yes RLE Weight Bearing Per Provider Order: Weight bearing as tolerated LLE Weight Bearing Per Provider Order: Weight bearing as tolerated     Mobility  Bed Mobility Overal bed mobility: Needs Assistance Bed Mobility: Sit to Supine       Sit to supine: Min assist   General bed mobility comments: assistance for LLE support. cues for technique and positioning in bed with pt taking extra time to perform movements.    Transfers Overall transfer level: Needs assistance Equipment used: Rolling walker (2 wheels) Transfers:  Bed to chair/wheelchair/BSC, Sit to/from Stand Sit to Stand: Min assist   Step pivot transfers: Min assist       General transfer comment: steadying assistance required with MIn cues for sequencing and technique. increased time and effort required    Ambulation/Gait     General Gait Details: pt deferred ambulation secondary to fatigue despite encouragement stating he has been up in the chair for 1 hour.       Balance Overall balance assessment: Needs assistance Sitting-balance support: Feet supported, Bilateral upper extremity supported, Single extremity supported Sitting balance-Leahy Scale: Good     Standing balance support: Bilateral upper extremity supported, Single extremity supported, Reliant on assistive device for balance Standing balance-Leahy Scale: Fair Standing balance comment: no overt LOB requires UE support for balance.      Cognition Arousal: Alert Behavior During Therapy: WFL for tasks assessed/performed Overall Cognitive Status: Within Functional Limits for tasks assessed       General Comments: spouse present throughout session and involved in care           General Comments General comments (skin integrity, edema, etc.): VS WNL Throughout session on room air      Pertinent Vitals/Pain Pain Assessment Pain Assessment: Faces Faces Pain Scale: Hurts a little bit Pain Location: RLE Pain Intervention(s): Limited activity within patient's tolerance     PT Goals (current goals can now be found in the care plan section) Acute Rehab PT Goals Patient Stated Goal: reutrn home PT Goal Formulation: With patient/family Time For Goal Achievement: 10/09/23 Potential to Achieve Goals: Good Progress towards PT goals: Progressing toward goals  Frequency    Min 1X/week      PT Plan  Continue with current POC        AM-PAC PT "6 Clicks" Mobility   Outcome Measure  Help needed turning from your back to your side while in a flat bed without  using bedrails?: A Little Help needed moving from lying on your back to sitting on the side of a flat bed without using bedrails?: A Little Help needed moving to and from a bed to a chair (including a wheelchair)?: A Little Help needed standing up from a chair using your arms (e.g., wheelchair or bedside chair)?: A Little Help needed to walk in hospital room?: A Little Help needed climbing 3-5 steps with a railing? : A Lot 6 Click Score: 17    End of Session Equipment Utilized During Treatment: Gait belt Activity Tolerance: Patient limited by fatigue;Patient tolerated treatment well Patient left: in bed;with call bell/phone within reach;with family/visitor present Nurse Communication: Mobility status PT Visit Diagnosis: Other abnormalities of gait and mobility (R26.89);Muscle weakness (generalized) (M62.81)     Time: 4132-4401 PT Time Calculation (min) (ACUTE ONLY): 15 min  Charges:    $Therapeutic Activity: 8-22 mins PT General Charges $$ ACUTE PT VISIT: 1 Visit                    Harrel Carina, DPT, CLT  Acute Rehabilitation Services Office: [phone] (Secure chat preferred)    Claudia Desanctis 09/27/2023, 9:58 AM

## 2023-09-27 NOTE — TOC Initial Note (Addendum)
Transition of Care (TOC) - Initial/Assessment Note  Donn Pierini RN, BSN Transitions of Care Unit 4E- RN Case Manager See Treatment Team for direct phone #   Patient Details  Name: Tanner Phillips MRN: [id number] Date of Birth: Jan 01, 1956  Transition of Care [facility]) CM/SW Contact:    Darrold Span, RN Phone Number: 09/27/2023, 2:42 PM  Clinical Narrative:                 Cm in to speak with pt and wife at bedside.  Discussed HH needs for discharge.  Pt and wife voiced that pt was active with [facility] for nursing and therapy and that they would like to continue with [facility] for [facility] needs. CM will reach out to confirm services with [facility] liaison (current orders for HHPT/OT).  [phone]- confirmed with [facility] liaison- pt active with HHRN/PT/OT- will need to add RN to orders   Pt denies any DME needs- as DME at home.  Wife voiced that MD mentioned pt may need VAC, no mention in MD note for [facility]- CM will follow for potential home VAC needs.   Expected Discharge Plan: Home w Home Health Services Barriers to Discharge: Continued Medical Work up   Patient Goals and CMS Choice Patient states their goals for this hospitalization and ongoing recovery are:: return home CMS Medicare.gov Compare Post Acute Care list provided to:: Patient Choice offered to / list presented to : Patient, Spouse      Expected Discharge Plan and Services   Discharge Planning Services: CM Consult Post Acute Care Choice: Home Health, Durable Medical Equipment                             HH Arranged: PT, OT, RN [facility] Agency: [facility] Care Date [facility] Agency Contacted: 09/27/23 Time HH Agency Contacted: [phone] Representative spoke with at [facility] Agency: Kandee Keen  Prior Living Arrangements/Services   Lives with:: Self, Spouse Patient language and need for interpreter reviewed:: Yes Do you feel safe going back to the place where you live?: Yes      Need for Family Participation in Patient Care: Yes  (Comment) Care giver support system in place?: Yes (comment) Current home services: DME (RW, BSC, wheelchair) Criminal Activity/Legal Involvement Pertinent to Current Situation/Hospitalization: No - Comment as needed  Activities of Daily Living   ADL Screening (condition at time of admission) Independently performs ADLs?: Yes (appropriate for developmental age) Is the patient deaf or have difficulty hearing?: No Does the patient have difficulty seeing, even when wearing glasses/contacts?: No Does the patient have difficulty concentrating, remembering, or making decisions?: No  Permission Sought/Granted Permission sought to share information with : Facility Engineer, maintenance (IT) granted to share info w AGENCY: [facility]        Emotional Assessment Appearance:: Appears stated age Attitude/Demeanor/Rapport: Engaged Affect (typically observed): Accepting Orientation: : Oriented to Self, Oriented to Place, Oriented to  Time, Oriented to Situation Alcohol / Substance Use: Not Applicable Psych Involvement: No (comment)  Admission diagnosis:  Critical limb ischemia of both lower extremities (HCC) [I70.223] PAD (peripheral artery disease) (HCC) [I73.9] Patient Active Problem List   Diagnosis Date Noted   Critical limb ischemia of both lower extremities (HCC) 09/18/2023   Non-healing surgical wound 09/14/2023   PAD (peripheral artery disease) (HCC) 08/08/2023   DVT, lower extremity (HCC) 08/07/2023   Radiculopathy 08/07/2023   Essential hypertension 08/07/2023   Renal insufficiency 08/07/2023  Cellulitis 08/06/2023   MYOCARDIAL INFARCTION 07/21/2010   PULMONARY NODULE, LEFT LOWER LOBE 07/21/2010   PCP:  Ralene Ok, MD Pharmacy:   [facility] [address] [place] - [address] [facility] [place] [address] Phone: [phone] Fax: [phone]  [facility] [address] [place] [address] Phone:  [phone] Fax: [phone]     Social Drivers of Health (SDOH) Social History: SDOH Screenings   Food Insecurity: No Food Insecurity (09/24/2023)  Housing: Low Risk  (09/26/2023)  Transportation Needs: No Transportation Needs (09/24/2023)  Utilities: Not At Risk (09/24/2023)  Tobacco Use: Low Risk  (09/24/2023)   SDOH Interventions:     Readmission Risk Interventions    08/21/2023    2:20 PM  Readmission Risk Prevention Plan  Post Dischage Appt Complete  Medication Screening Complete  Transportation Screening Complete

## 2023-09-28 LAB — BASIC METABOLIC PANEL
Anion gap: 10 (ref 5–15)
BUN: 14 mg/dL (ref 8–23)
CO2: 22 mmol/L (ref 22–32)
Calcium: 8.7 mg/dL — ABNORMAL LOW (ref 8.9–10.3)
Chloride: 106 mmol/L (ref 98–111)
Creatinine, Ser: 1.31 mg/dL — ABNORMAL HIGH (ref 0.61–1.24)
GFR, Estimated: 59 mL/min — ABNORMAL LOW (ref >60)
Glucose, Bld: 102 mg/dL — ABNORMAL HIGH (ref 70–99)
Potassium: 3.1 mmol/L — ABNORMAL LOW (ref 3.5–5.1)
Sodium: 138 mmol/L (ref 135–145)

## 2023-09-28 LAB — CBC
HCT: 29 % — ABNORMAL LOW (ref 39.0–52.0)
Hemoglobin: 9.3 g/dL — ABNORMAL LOW (ref 13.0–17.0)
MCH: 27.5 pg (ref 26.0–34.0)
MCHC: 32.1 g/dL (ref 30.0–36.0)
MCV: 85.8 fL (ref 80.0–100.0)
Platelets: 363 10*3/uL (ref 150–400)
RBC: 3.38 MIL/uL — ABNORMAL LOW (ref 4.22–5.81)
RDW: 14.4 % (ref 11.5–15.5)
WBC: 14.1 10*3/uL — ABNORMAL HIGH (ref 4.0–10.5)
nRBC: 0 % (ref 0.0–0.2)

## 2023-09-28 LAB — APTT: aPTT: 28 s (ref 24–36)

## 2023-09-28 MED ORDER — APIXABAN 5 MG PO TABS
5.0000 mg | ORAL_TABLET | Freq: Two times a day (BID) | ORAL | Status: DC
  Administered 2023-09-28 – 2023-10-01 (×7): 5 mg via ORAL
  Filled 2023-09-28 (×7): qty 1

## 2023-09-28 MED ORDER — ALPRAZOLAM 0.5 MG PO TABS
0.5000 mg | ORAL_TABLET | Freq: Three times a day (TID) | ORAL | Status: DC | PRN
  Administered 2023-09-28 – 2023-09-29 (×2): 0.5 mg via ORAL
  Filled 2023-09-28 (×2): qty 1

## 2023-09-28 NOTE — Plan of Care (Signed)
  Problem: Activity: Goal: Risk for activity intolerance will decrease Outcome: Progressing   

## 2023-09-28 NOTE — Progress Notes (Addendum)
  Progress Note    09/28/2023 8:00 AM 4 Days Post-Op  Subjective:  no major complaints this morning. Says he walked the hallways. Pain well controlled   Vitals:   09/27/23 2315 09/28/23 0424  BP: 137/69 (!) 146/79  Pulse:    Resp: 17 20  Temp: 97.8 F (36.6 C) 98.1 F (36.7 C)  SpO2:     Physical Exam: Cardiac:  regular Lungs:  non labored Incisions:  Right groin incision is intact. Dry gauze replaced. Right proximal thigh wound clean, wet to dry dressings in place. Distal right leg incisions and LLE leg incisions all intact and well appearing Extremities: palpable pulse in RLE bypass graft. Brisk doppler Peroneal signal on right leg. Doppler signal in LLE bypass at knee. Left leg dressings in place Abdomen:  soft Neurologic: alert and oriented  CBC    Component Value Date/Time   WBC 14.1 (H) 09/28/2023 0345   RBC 3.38 (L) 09/28/2023 0345   HGB 9.3 (L) 09/28/2023 0345   HCT 29.0 (L) 09/28/2023 0345   PLT 363 09/28/2023 0345   MCV 85.8 09/28/2023 0345   MCH 27.5 09/28/2023 0345   MCHC 32.1 09/28/2023 0345   RDW 14.4 09/28/2023 0345   LYMPHSABS 1.2 08/06/2023 0927   MONOABS 1.3 (H) 08/06/2023 0927   EOSABS 0.1 08/06/2023 0927   BASOSABS 0.1 08/06/2023 0927    BMET    Component Value Date/Time   NA 138 09/28/2023 0345   K 3.1 (L) 09/28/2023 0345   CL 106 09/28/2023 0345   CO2 22 09/28/2023 0345   GLUCOSE 102 (H) 09/28/2023 0345   BUN 14 09/28/2023 0345   CREATININE 1.31 (H) 09/28/2023 0345   CALCIUM 8.7 (L) 09/28/2023 0345   GFRNONAA 59 (L) 09/28/2023 0345   GFRAA 52 (L) 05/21/2017 0010    INR    Component Value Date/Time   INR 1.0 09/24/2023 0911     Intake/Output Summary (Last 24 hours) at 09/28/2023 0800 Last data filed at 09/28/2023 0400 Gross per 24 hour  Intake 359.87 ml  Output 500 ml  Net -140.13 ml     Assessment/Plan:  67 y.o. male is s/p Right CFA to peroneal bypass with ipsilateral reversed GSV through saphenectomy tunnel and  right peroneal endarterectomy and left heel debridement   4 Days Post-Op   BLE remain well perfused. Right with palpable pulse in bypass graft. Brisk doppler Peroneal signal in right foot. Doppler signal in LLE bypass graft Mentation better this morning  RLE incision in proximal thigh dehisced. Will place small wound VAC later this morning Continue wet to dry dressings to left heel and leg daily PT/ OT/ RN arranged Home VAC ordered placed Continue to mobilize as tolerated   Graceann Congress, PA-C [facility] [phone] 09/28/2023 8:00 AM  I have seen and evaluated the patient. I agree with the PA note as documented above.  Postop day 4 status post right common femoral to peroneal bypass with ipsilateral reverse saphenous vein.  Palpable pulse in the bypass.  Brisk triphasic peroneal signal at the ankle.  Will apply a VAC to dehiscence of saphenectomy incision.  Dressings changed to the left lower extremity wet-to-dry and all his wounds look good.  Hopefully discharge over the weekend or Monday.  Will restart Eliquis.  Cephus Shelling, MD [facility] [place] Office: [phone]

## 2023-09-28 NOTE — TOC Progression Note (Signed)
Transition of Care (TOC) - Progression Note  Donn Pierini RN, BSN Transitions of Care Unit 4E- RN Case Manager See Treatment Team for direct phone #   Patient Details  Name: Tanner Phillips MRN: [id number] Date of Birth: 04/19/1956  Transition of Care [facility]) CM/SW Contact  Zenda Alpers, Lenn Sink, RN Phone Number: 09/28/2023, 1:27 PM  Clinical Narrative:    Wound VAC placed today, confirmed plan for pt to return home with home [facility], Order form has been signed for [facility] home [facility] needs and faxed to [facility] for approval.   Per attending team- EDD for Monday, CM has updated [facility] lia[facility] that pt will need nursing for [facility] drsg changes with a start of care for either th/fri next week due to Christmas holiday.   Home Wound VAC approval- pending  HHRN/PT/OT- set up w/ [facility]  TOC to continue to follow   Expected Discharge Plan: Home w Home Health Services Barriers to Discharge: Continued Medical Work up  Expected Discharge Plan and Services   Discharge Planning Services: CM Consult Post Acute Care Choice: Home Health, Durable Medical Equipment                   DME Arranged: Vac DME Agency: [facility] Date DME Agency Contacted: 09/28/23 Time DME Agency Contacted: [phone] Representative spoke with at DME Agency: [facility] HH Arranged: PT, OT, RN [facility] Agency: [facility] Date [facility] Agency Contacted: 09/27/23 Time HH Agency Contacted: [phone] Representative spoke with at [facility] Agency: Kandee Keen   Social Determinants of Health (SDOH) Interventions SDOH Screenings   Food Insecurity: No Food Insecurity (09/24/2023)  Housing: Low Risk  (09/26/2023)  Transportation Needs: No Transportation Needs (09/24/2023)  Utilities: Not At Risk (09/24/2023)  Tobacco Use: Low Risk  (09/24/2023)    Readmission Risk Interventions    08/21/2023    2:20 PM  Readmission Risk Prevention Plan  Post Dischage Appt Complete  Medication Screening Complete  Transportation Screening Complete

## 2023-09-28 NOTE — Plan of Care (Signed)

## 2023-09-28 NOTE — Progress Notes (Signed)
Patient was provided with Eliquis and Heparin was discontinued.  Immediately after administration, patient vomited.  Unsure if eliquis was present in emesis.  Zofran was provided to patient.

## 2023-09-29 NOTE — Progress Notes (Addendum)
  Progress Note    09/29/2023 8:43 AM 5 Days Post-Op  Subjective:  no new complaints.  Wound vac reinforced overnight and working well this morning   Vitals:   09/28/23 2314 09/29/23 0328  BP: (!) 142/60 128/64  Pulse: 100 88  Resp: 20 20  Temp: 98.1 F (36.7 C) 97.7 F (36.5 C)  SpO2: 97% 94%   Physical Exam: Lungs:  non labored Incisions:  R proximal vein harvest incision with wound vac, good seal Extremities:  brisk R peroneal at the ankle. Soft DP by doppler RLE; soft L PT by doppler Neurologic: A&O  CBC    Component Value Date/Time   WBC 14.1 (H) 09/28/2023 0345   RBC 3.38 (L) 09/28/2023 0345   HGB 9.3 (L) 09/28/2023 0345   HCT 29.0 (L) 09/28/2023 0345   PLT 363 09/28/2023 0345   MCV 85.8 09/28/2023 0345   MCH 27.5 09/28/2023 0345   MCHC 32.1 09/28/2023 0345   RDW 14.4 09/28/2023 0345   LYMPHSABS 1.2 08/06/2023 0927   MONOABS 1.3 (H) 08/06/2023 0927   EOSABS 0.1 08/06/2023 0927   BASOSABS 0.1 08/06/2023 0927    BMET    Component Value Date/Time   NA 138 09/28/2023 0345   K 3.1 (L) 09/28/2023 0345   CL 106 09/28/2023 0345   CO2 22 09/28/2023 0345   GLUCOSE 102 (H) 09/28/2023 0345   BUN 14 09/28/2023 0345   CREATININE 1.31 (H) 09/28/2023 0345   CALCIUM 8.7 (L) 09/28/2023 0345   GFRNONAA 59 (L) 09/28/2023 0345   GFRAA 52 (L) 05/21/2017 0010    INR    Component Value Date/Time   INR 1.0 09/24/2023 0911     Intake/Output Summary (Last 24 hours) at 09/29/2023 0843 Last data filed at 09/29/2023 0329 Gross per 24 hour  Intake --  Output 0 ml  Net 0 ml     Assessment/Plan:  67 y.o. male is s/p Right CFA to peroneal bypass with ipsilateral reversed GSV through saphenectomy tunnel and right peroneal endarterectomy and left heel debridement  5 Days Post-Op   BLE warm and well perfused based on doppler exam R proximal vein harvest incision vac reinforced and now working well; TOC consulted to arrange home vac with [facility] RN Continue wet to dry  dressing changes LLE daily OOB with mobility and therapy Eliquis restarted Plan is for discharge home when home vac and HH is arranged   Emilie Rutter, PA-C [facility] [phone] 09/29/2023 8:43 AM  I agree with the above.  Have seen and evaluated the patient.  We are working on wound VAC plans for home.  We will try to get this arranged over the weekend but more than likely he will need to have this worked out on Monday.  Once this has been arranged he can be discharged.  Durene Cal

## 2023-09-29 NOTE — Plan of Care (Signed)
  Problem: Clinical Measurements: Goal: Ability to maintain clinical measurements within normal limits will improve Outcome: Progressing   Problem: Clinical Measurements: Goal: Will remain free from infection Outcome: Progressing   Problem: Clinical Measurements: Goal: Diagnostic test results will improve Outcome: Progressing   Problem: Clinical Measurements: Goal: Respiratory complications will improve Outcome: Progressing   Problem: Activity: Goal: Risk for activity intolerance will decrease Outcome: Progressing   Problem: Coping: Goal: Level of anxiety will decrease Outcome: Progressing   Problem: Elimination: Goal: Will not experience complications related to bowel motility Outcome: Progressing Goal: Will not experience complications related to urinary retention Outcome: Progressing

## 2023-09-30 NOTE — Progress Notes (Addendum)
  Progress Note    09/30/2023 9:08 AM 6 Days Post-Op  Subjective:  no complaints   Vitals:   09/29/23 2348 09/30/23 0350  BP: 117/63 129/69  Pulse: 78 80  Resp: 20 20  Temp: 97.7 F (36.5 C) 97.7 F (36.5 C)  SpO2: 90% 93%   Physical Exam: Lungs:  non labored Incisions:  RLE groin and lower leg incisions well appearing; proximal vein harvest with vac and good seal; LLE dressing changed this morning Extremities:  brisk R peroneal and DP by doppler; brisk L PT by doppler Neurologic: A&O  CBC    Component Value Date/Time   WBC 14.1 (H) 09/28/2023 0345   RBC 3.38 (L) 09/28/2023 0345   HGB 9.3 (L) 09/28/2023 0345   HCT 29.0 (L) 09/28/2023 0345   PLT 363 09/28/2023 0345   MCV 85.8 09/28/2023 0345   MCH 27.5 09/28/2023 0345   MCHC 32.1 09/28/2023 0345   RDW 14.4 09/28/2023 0345   LYMPHSABS 1.2 08/06/2023 0927   MONOABS 1.3 (H) 08/06/2023 0927   EOSABS 0.1 08/06/2023 0927   BASOSABS 0.1 08/06/2023 0927    BMET    Component Value Date/Time   NA 138 09/28/2023 0345   K 3.1 (L) 09/28/2023 0345   CL 106 09/28/2023 0345   CO2 22 09/28/2023 0345   GLUCOSE 102 (H) 09/28/2023 0345   BUN 14 09/28/2023 0345   CREATININE 1.31 (H) 09/28/2023 0345   CALCIUM 8.7 (L) 09/28/2023 0345   GFRNONAA 59 (L) 09/28/2023 0345   GFRAA 52 (L) 05/21/2017 0010    INR    Component Value Date/Time   INR 1.0 09/24/2023 0911     Intake/Output Summary (Last 24 hours) at 09/30/2023 0908 Last data filed at 09/29/2023 1600 Gross per 24 hour  Intake 240 ml  Output --  Net 240 ml     Assessment/Plan:  67 y.o. male is s/p Right CFA to peroneal bypass with ipsilateral reversed GSV through saphenectomy tunnel and right peroneal endarterectomy and left heel debridement   6 Days Post-Op   BLE well perfused based on doppler exam R proximal vein harvest incision with wound vac, good seal; TOC arranging home vac and HH RN/PT/OT Continue daily wet to dry dressing changes L lower leg and foot  wounds OOB Home when home vac and HH approved    Emilie Rutter, PA-C [facility] [phone] 09/30/2023 9:08 AM   I agree with the above.  Have seen and evaluated the patient.  We anticipate discharge tomorrow once home wound VAC has been arranged  Durene Cal

## 2023-09-30 NOTE — Plan of Care (Signed)
  Problem: Education: Goal: Knowledge of General Education information will improve Description: Including pain rating scale, medication(s)/side effects and non-pharmacologic comfort measures Outcome: Progressing   Problem: Health Behavior/Discharge Planning: Goal: Ability to manage health-related needs will improve Outcome: Progressing   Problem: Clinical Measurements: Goal: Ability to maintain clinical measurements within normal limits will improve Outcome: Progressing Goal: Will remain free from infection Outcome: Progressing Goal: Diagnostic test results will improve Outcome: Progressing Goal: Respiratory complications will improve Outcome: Progressing Goal: Cardiovascular complication will be avoided Outcome: Progressing   Problem: Activity: Goal: Risk for activity intolerance will decrease Outcome: Progressing   Problem: Nutrition: Goal: Adequate nutrition will be maintained Outcome: Progressing   Problem: Coping: Goal: Level of anxiety will decrease Outcome: Progressing   Problem: Education: Goal: Knowledge of prescribed regimen will improve Outcome: Progressing   Problem: Activity: Goal: Ability to tolerate increased activity will improve Outcome: Progressing   Problem: Bowel/Gastric: Goal: Gastrointestinal status for postoperative course will improve Outcome: Progressing   Problem: Clinical Measurements: Goal: Postoperative complications will be avoided or minimized Outcome: Progressing Goal: Signs and symptoms of graft occlusion will improve Outcome: Progressing

## 2023-09-30 NOTE — Plan of Care (Signed)
  Problem: Clinical Measurements: Goal: Ability to maintain clinical measurements within normal limits will improve Outcome: Progressing   Problem: Education: Goal: Knowledge of General Education information will improve Description: Including pain rating scale, medication(s)/side effects and non-pharmacologic comfort measures Outcome: Progressing   Problem: Activity: Goal: Risk for activity intolerance will decrease Outcome: Progressing   Problem: Coping: Goal: Level of anxiety will decrease Outcome: Progressing   Problem: Clinical Measurements: Goal: Cardiovascular complication will be avoided Outcome: Progressing   Problem: Pain Management: Goal: General experience of comfort will improve Outcome: Progressing   Problem: Safety: Goal: Ability to remain free from injury will improve Outcome: Progressing

## 2023-10-01 ENCOUNTER — Other Ambulatory Visit (HOSPITAL_COMMUNITY): Payer: Self-pay

## 2023-10-01 ENCOUNTER — Other Ambulatory Visit: Payer: Self-pay | Admitting: Vascular Surgery

## 2023-10-01 MED ORDER — HYDROCODONE-ACETAMINOPHEN 5-325 MG PO TABS
1.0000 | ORAL_TABLET | ORAL | 0 refills | Status: DC | PRN
  Filled 2023-10-01: qty 30, 5d supply, fill #0

## 2023-10-01 MED ORDER — APIXABAN 5 MG PO TABS: 5.0000 mg | ORAL_TABLET | Freq: Two times a day (BID) | ORAL | 0 refills | Status: DC

## 2023-10-01 NOTE — Progress Notes (Signed)
Occupational Therapy Treatment Patient Details Name: Tanner Phillips MRN: [id number] DOB: 06-15-1956 Today's Date: 10/01/2023   History of present illness Pt is a 67 yo male admitted 12/16 for Right CFA to peroneal BPG, right peroneal endarterectomy and left heel debridement.Marland Kitchen PMHx: LLE DVT, 08/13/23 Lt common femoral to ant tib BPG. 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 ruptured disk in back, HTN, CAD s/p MI   OT comments  Pt making good progress with functional goals and eager to d/c home today. Pt's wife present and will provide assist with ADLs at home      If plan is discharge home, recommend the following:  A little help with walking and/or transfers;A little help with bathing/dressing/bathroom;Assistance with cooking/housework;Assist for transportation   Equipment Recommendations  BSC/3in1;Other (comment) Lexicographer)    Recommendations for Other Services      Precautions / Restrictions Precautions Precautions: Fall;Other (comment) Precaution Comments: VAC Restrictions Weight Bearing Restrictions Per Provider Order: No RLE Weight Bearing Per Provider Order: Weight bearing as tolerated LLE Weight Bearing Per Provider Order: Weight bearing as tolerated       Mobility Bed Mobility Overal bed mobility: Modified Independent Bed Mobility: Supine to Sit                Transfers Overall transfer level: Modified independent Equipment used: Rolling walker (2 wheels) Transfers: Sit to/from Stand Sit to Stand: Supervision                 Balance Overall balance assessment: Mild deficits observed, not formally tested Sitting-balance support: Feet supported, Bilateral upper extremity supported, Single extremity supported Sitting balance-Leahy Scale: Good     Standing balance support: Bilateral upper extremity supported Standing balance-Leahy Scale: Fair                             ADL either performed or assessed with clinical judgement   ADL Overall ADL's :  Needs assistance/impaired     Grooming: Wash/dry hands;Wash/dry face;Supervision/safety;Standing   Upper Body Bathing: Set up;Sitting Upper Body Bathing Details (indicate cue type and reason): simulated Lower Body Bathing: Moderate assistance;With caregiver independent assisting Lower Body Bathing Details (indicate cue type and reason): simulated Upper Body Dressing : Set up;Sitting   Lower Body Dressing: Moderate assistance;Sit to/from stand;With caregiver independent assisting   Toilet Transfer: Supervision/safety;Ambulation;Rolling walker (2 wheels);Grab bars   Toileting- Clothing Manipulation and Hygiene: Contact guard assist;Sit to/from stand       Functional mobility during ADLs: Supervision/safety;Rolling walker (2 wheels)      Extremity/Trunk Assessment Upper Extremity Assessment Upper Extremity Assessment: Overall WFL for tasks assessed   Lower Extremity Assessment Lower Extremity Assessment: Defer to PT evaluation        Vision Ability to See in Adequate Light: 0 Adequate Patient Visual Report: No change from baseline     Perception     Praxis      Cognition Arousal: Alert Behavior During Therapy: WFL for tasks assessed/performed Overall Cognitive Status: Within Functional Limits for tasks assessed                         Following Commands: Follows multi-step commands consistently                Exercises      Shoulder Instructions       General Comments      Pertinent Vitals/ Pain       Pain Assessment Pain Assessment:  0-10 Pain Score: 2  Pain Location: BLE Pain Descriptors / Indicators: Discomfort, Sore Pain Intervention(s): Monitored during session, Repositioned  Home Living                                          Prior Functioning/Environment              Frequency  Min 1X/week        Progress Toward Goals  OT Goals(current goals can now be found in the care plan section)  Progress  towards OT goals: Progressing toward goals     Plan      Co-evaluation                 AM-PAC OT "6 Clicks" Daily Activity     Outcome Measure   Help from another person eating meals?: None Help from another person taking care of personal grooming?: None Help from another person toileting, which includes using toliet, bedpan, or urinal?: A Little Help from another person bathing (including washing, rinsing, drying)?: A Lot Help from another person to put on and taking off regular upper body clothing?: A Little Help from another person to put on and taking off regular lower body clothing?: A Lot 6 Click Score: 18    End of Session Equipment Utilized During Treatment: Gait belt;Rolling walker (2 wheels)  OT Visit Diagnosis: Unsteadiness on feet (R26.81);Other abnormalities of gait and mobility (R26.89);Repeated falls (R29.6);Muscle weakness (generalized) (M62.81);History of falling (Z91.81);Pain Pain - Right/Left: Right Pain - part of body: Leg   Activity Tolerance Patient tolerated treatment well   Patient Left with call bell/phone within reach;with family/visitor present;in bed (sitting EOB)   Nurse Communication          Time: 9562-1308 OT Time Calculation (min): 24 min  Charges: OT General Charges $OT Visit: 1 Visit OT Treatments $Self Care/Home Management : 8-22 mins $Therapeutic Activity: 8-22 mins    Galen Manila 10/01/2023, 12:38 PM

## 2023-10-01 NOTE — Progress Notes (Signed)
  Progress Note    10/01/2023 8:10 AM 7 Days Post-Op  Subjective:  no complaints.  Eager to go home   Vitals:   10/01/23 0000 10/01/23 0513  BP: 133/67 136/75  Pulse:    Resp:    Temp: 97.8 F (36.6 C) 97.6 F (36.4 C)  SpO2:     Physical Exam: Lungs:  non labored Incisions:   RLE groin and lower leg incisions well appearing; proximal vein harvest with vac and good seal; LLE dressing changed this morning Extremities:  brisk R peroneal and DP by doppler; brisk L PT by doppler Neurologic: A&O  CBC    Component Value Date/Time   WBC 14.1 (H) 09/28/2023 0345   RBC 3.38 (L) 09/28/2023 0345   HGB 9.3 (L) 09/28/2023 0345   HCT 29.0 (L) 09/28/2023 0345   PLT 363 09/28/2023 0345   MCV 85.8 09/28/2023 0345   MCH 27.5 09/28/2023 0345   MCHC 32.1 09/28/2023 0345   RDW 14.4 09/28/2023 0345   LYMPHSABS 1.2 08/06/2023 0927   MONOABS 1.3 (H) 08/06/2023 0927   EOSABS 0.1 08/06/2023 0927   BASOSABS 0.1 08/06/2023 0927    BMET    Component Value Date/Time   NA 138 09/28/2023 0345   K 3.1 (L) 09/28/2023 0345   CL 106 09/28/2023 0345   CO2 22 09/28/2023 0345   GLUCOSE 102 (H) 09/28/2023 0345   BUN 14 09/28/2023 0345   CREATININE 1.31 (H) 09/28/2023 0345   CALCIUM 8.7 (L) 09/28/2023 0345   GFRNONAA 59 (L) 09/28/2023 0345   GFRAA 52 (L) 05/21/2017 0010    INR    Component Value Date/Time   INR 1.0 09/24/2023 0911     Intake/Output Summary (Last 24 hours) at 10/01/2023 0810 Last data filed at 09/30/2023 1847 Gross per 24 hour  Intake 960 ml  Output --  Net 960 ml     Assessment/Plan:  67 y.o. male is s/p Right CFA to peroneal bypass with ipsilateral reversed GSV through saphenectomy tunnel and right peroneal endarterectomy and left heel debridement  7 Days Post-Op   BLE well perfused R thigh vac with good seal; vac to be changed today Wet to dry dressings L lower leg and foot Home when home vac approved and delivered   Emilie Rutter, PA-C [facility] [phone] 10/01/2023 8:10 AM

## 2023-10-01 NOTE — TOC Transition Note (Signed)
Transition of Care [facility]) - Discharge Note Donn Pierini RN, BSN Transitions of Care Unit 4E- RN Case Manager See Treatment Team for direct phone #   Patient Details  Name: Tanner Phillips MRN: [id number] Date of Birth: [birth year]  Transition of Care [facility]) CM/SW Contact:  Darrold Span, RN Phone Number: 10/01/2023, 12:39 PM   Clinical Narrative:    Pt stable for transition home today,  CM has confirmed that home wound VAC has been approved with [employer]- due to pt's insurance- approval had to go through 3rd party- [employer].  Home VAC to be delivered by [employer] outside vendor driver, who will deliver this afternoon.   HH has been resumed with [facility] as per pt request- Orders in for HHRN/PT/OT.  Liaison notified that next wound VAC drsg needs (per [facility]-PA-M. [place]- ok with Thur/Fri - due to Christmas holiday).   CM updated pt and wife at bedside- they also have spoken with [employer] and paid copay.   Wife to transport home.   No further TOC needs noted.    Final next level of care: Home w Home Health Services Barriers to Discharge: Barriers Resolved   Patient Goals and CMS Choice Patient states their goals for this hospitalization and ongoing recovery are:: return home CMS Medicare.gov Compare Post Acute Care list provided to:: Patient Choice offered to / list presented to : Patient, Spouse      Discharge Placement                 Home  w/ [facility]      Discharge Plan and Services Additional resources added to the After Visit Summary for     Discharge Planning Services: CM Consult Post Acute Care Choice: Home Health, Durable Medical Equipment          DME Arranged: Vac DME Agency: [employer] Date DME Agency Contacted: 09/28/23 Time DME Agency Contacted: [phone] Representative spoke with at DME Agency: [place] HH Arranged: PT, OT, RN [facility] Agency: [facility] Date [facility] Agency Contacted: 09/27/23 Time HH Agency Contacted: [phone] Representative spoke with at [facility] Agency:  Kandee Keen  Social Drivers of Health (SDOH) Interventions SDOH Screenings   Food Insecurity: No Food Insecurity (09/24/2023)  Housing: Low Risk  (09/26/2023)  Transportation Needs: No Transportation Needs (09/24/2023)  Utilities: Not At Risk (09/24/2023)  Tobacco Use: Low Risk  (09/24/2023)     Readmission Risk Interventions    10/01/2023   12:38 PM 08/21/2023    2:20 PM  Readmission Risk Prevention Plan  Post Dischage Appt Complete Complete  Medication Screening Complete Complete  Transportation Screening Complete Complete

## 2023-10-01 NOTE — Plan of Care (Signed)
  Problem: Education: Goal: Knowledge of General Education information will improve Description: Including pain rating scale, medication(s)/side effects and non-pharmacologic comfort measures Outcome: Progressing   Problem: Health Behavior/Discharge Planning: Goal: Ability to manage health-related needs will improve Outcome: Progressing   Problem: Clinical Measurements: Goal: Ability to maintain clinical measurements within normal limits will improve Outcome: Progressing Goal: Will remain free from infection Outcome: Progressing Goal: Diagnostic test results will improve Outcome: Progressing Goal: Respiratory complications will improve Outcome: Progressing Goal: Cardiovascular complication will be avoided Outcome: Progressing   Problem: Coping: Goal: Level of anxiety will decrease Outcome: Progressing   Problem: Nutrition: Goal: Adequate nutrition will be maintained Outcome: Progressing   Problem: Elimination: Goal: Will not experience complications related to bowel motility Outcome: Progressing Goal: Will not experience complications related to urinary retention Outcome: Progressing   Problem: Pain Management: Goal: General experience of comfort will improve Outcome: Progressing   Problem: Activity: Goal: Ability to tolerate increased activity will improve Outcome: Progressing   Problem: Bowel/Gastric: Goal: Gastrointestinal status for postoperative course will improve Outcome: Progressing   Problem: Clinical Measurements: Goal: Postoperative complications will be avoided or minimized Outcome: Progressing Goal: Signs and symptoms of graft occlusion will improve Outcome: Progressing   Problem: Skin Integrity: Goal: Demonstration of wound healing without infection will improve Outcome: Progressing

## 2023-10-01 NOTE — Plan of Care (Signed)
  Problem: Education: Goal: Knowledge of General Education information will improve Description: Including pain rating scale, medication(s)/side effects and non-pharmacologic comfort measures Outcome: Adequate for Discharge   Problem: Health Behavior/Discharge Planning: Goal: Ability to manage health-related needs will improve Outcome: Adequate for Discharge   Problem: Clinical Measurements: Goal: Ability to maintain clinical measurements within normal limits will improve Outcome: Adequate for Discharge Goal: Will remain free from infection Outcome: Adequate for Discharge Goal: Diagnostic test results will improve Outcome: Adequate for Discharge Goal: Respiratory complications will improve Outcome: Adequate for Discharge Goal: Cardiovascular complication will be avoided Outcome: Adequate for Discharge   Problem: Activity: Goal: Risk for activity intolerance will decrease Outcome: Adequate for Discharge   Problem: Nutrition: Goal: Adequate nutrition will be maintained Outcome: Adequate for Discharge   Problem: Coping: Goal: Level of anxiety will decrease Outcome: Adequate for Discharge   Problem: Elimination: Goal: Will not experience complications related to bowel motility Outcome: Adequate for Discharge Goal: Will not experience complications related to urinary retention Outcome: Adequate for Discharge   Problem: Pain Management: Goal: General experience of comfort will improve Outcome: Adequate for Discharge   Problem: Safety: Goal: Ability to remain free from injury will improve Outcome: Adequate for Discharge   Problem: Skin Integrity: Goal: Risk for impaired skin integrity will decrease Outcome: Adequate for Discharge   Problem: Education: Goal: Knowledge of prescribed regimen will improve Outcome: Adequate for Discharge   Problem: Activity: Goal: Ability to tolerate increased activity will improve Outcome: Adequate for Discharge   Problem:  Bowel/Gastric: Goal: Gastrointestinal status for postoperative course will improve Outcome: Adequate for Discharge   Problem: Clinical Measurements: Goal: Postoperative complications will be avoided or minimized Outcome: Adequate for Discharge Goal: Signs and symptoms of graft occlusion will improve Outcome: Adequate for Discharge   Problem: Skin Integrity: Goal: Demonstration of wound healing without infection will improve Outcome: Adequate for Discharge   Problem: Acute Rehab PT Goals(only PT should resolve) Goal: Pt Will Go Supine/Side To Sit Outcome: Adequate for Discharge Goal: Patient Will Transfer Sit To/From Stand Outcome: Adequate for Discharge Goal: Pt Will Ambulate Outcome: Adequate for Discharge Goal: Pt Will Go Up/Down Stairs Outcome: Adequate for Discharge   Problem: Acute Rehab OT Goals (only OT should resolve) Goal: Pt. Will Perform Grooming Outcome: Adequate for Discharge Goal: Pt. Will Perform Upper Body Dressing Outcome: Adequate for Discharge Goal: Pt. Will Perform Lower Body Dressing Outcome: Adequate for Discharge Goal: Pt. Will Transfer To Toilet Outcome: Adequate for Discharge

## 2023-10-01 NOTE — Progress Notes (Signed)
Physical Therapy Treatment Patient Details Name: Tanner Phillips MRN: [id number] DOB: 05/10/1956 Today's Date: 10/01/2023   History of Present Illness Pt is a 67 yo male admitted 12/16 for Right CFA to peroneal BPG, right peroneal endarterectomy and left heel debridement.Marland Kitchen PMHx: LLE DVT, 08/13/23 Lt common femoral to ant tib BPG. 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 ruptured disk in back, HTN, CAD s/p MI    PT Comments  Pt pleasant, eager to return home and able to demonstrate basic transfers, gait and functional mobility without need for physical assist. Pt no longer impulsive with mobility and maintaining appropriate RW use. Pt denied need to practice stairs into home and aware of LB HEP to maintain ROM and strength. Mobility appropriate for return home.     If plan is discharge home, recommend the following: A little help with walking and/or transfers;Assistance with cooking/housework;Assist for transportation;Help with stairs or ramp for entrance   Can travel by private vehicle        Equipment Recommendations  None recommended by PT    Recommendations for Other Services       Precautions / Restrictions Precautions Precautions: Fall;Other (comment) Precaution Comments: VAC     Mobility  Bed Mobility Overal bed mobility: Modified Independent Bed Mobility: Supine to Sit           General bed mobility comments: use of rail and HOB 35 degrees    Transfers Overall transfer level: Modified independent                      Ambulation/Gait Ambulation/Gait assistance: Supervision Gait Distance (Feet): 230 Feet Assistive device: Rolling walker (2 wheels) Gait Pattern/deviations: Step-through pattern, Decreased stride length   Gait velocity interpretation: 1.31 - 2.62 ft/sec, indicative of limited community ambulator   General Gait Details: pt with good posture and proximity to RW this session, able to self-regulate distance   Stairs             Wheelchair  Mobility     Tilt Bed    Modified Rankin (Stroke Patients Only)       Balance Overall balance assessment: Mild deficits observed, not formally tested                                          Cognition Arousal: Alert Behavior During Therapy: WFL for tasks assessed/performed Overall Cognitive Status: Within Functional Limits for tasks assessed                                          Exercises General Exercises - Lower Extremity Long Arc Quad: AROM, Both, 10 reps, Seated    General Comments        Pertinent Vitals/Pain Pain Assessment Pain Assessment: No/denies pain    Home Living                          Prior Function            PT Goals (current goals can now be found in the care plan section) Progress towards PT goals: Progressing toward goals    Frequency    Min 1X/week      PT Plan      Co-evaluation  AM-PAC PT "6 Clicks" Mobility   Outcome Measure  Help needed turning from your back to your side while in a flat bed without using bedrails?: None Help needed moving from lying on your back to sitting on the side of a flat bed without using bedrails?: None Help needed moving to and from a bed to a chair (including a wheelchair)?: None Help needed standing up from a chair using your arms (e.g., wheelchair or bedside chair)?: A Little Help needed to walk in hospital room?: A Little Help needed climbing 3-5 steps with a railing? : A Little 6 Click Score: 21    End of Session   Activity Tolerance: Patient tolerated treatment well Patient left: in chair;with call bell/phone within reach;with family/visitor present;with nursing/sitter in room Nurse Communication: Mobility status PT Visit Diagnosis: Other abnormalities of gait and mobility (R26.89);Muscle weakness (generalized) (M62.81)     Time: 1914-7829 PT Time Calculation (min) (ACUTE ONLY): 14 min  Charges:    $Gait Training:  8-22 mins PT General Charges $$ ACUTE PT VISIT: 1 Visit                     Tanner Phillips, PT Acute Rehabilitation Services Office: [phone]    Tanner Phillips 10/01/2023, 7:46 AM

## 2023-10-01 NOTE — Discharge Instructions (Signed)
 [facility] [place]  Discharge instructions  Lower Extremity Bypass Surgery  Please refer to the following instruction for your post-procedure care. Your surgeon or physician assistant will discuss any changes with you.  Activity  You are encouraged to walk as much as you can. You can slowly return to normal activities during the month after your surgery. Avoid strenuous activity and heavy lifting until your doctor tells you it's OK. Avoid activities such as vacuuming or swinging a golf club. Do not drive until your doctor give the OK and you are no longer taking prescription pain medications. It is also normal to have difficulty with sleep habits, eating and bowel movement after surgery. These will go away with time.  Bathing/Showering  You may shower after you go home. Do not soak in a bathtub, hot tub, or swim until the incision heals completely.  Incision Care  Clean your incision with mild soap and water. Shower every day. Pat the area dry with a clean towel. You do not need a bandage unless otherwise instructed. Do not apply any ointments or creams to your incision. If you have open wounds you will be instructed how to care for them or a visiting nurse may be arranged for you. If you have staples or sutures along your incision they will be removed at your post-op appointment. You may have skin glue on your incision. Do not peel it off. It will come off on its own in about one week. If you have a great deal of moisture in your groin, use a gauze help keep this area dry.  Diet  Resume your normal diet. There are no special food restrictions following this procedure. A low fat/ low cholesterol diet is recommended for all patients with vascular disease. In order to heal from your surgery, it is CRITICAL to get adequate nutrition. Your body requires vitamins, minerals, and protein. Vegetables are the best source of vitamins and minerals. Vegetables also provide the  perfect balance of protein. Processed food has little nutritional value, so try to avoid this.  Medications  Resume taking all your medications unless your doctor or nurse practitioner tells you not to. If your incision is causing pain, you may take over-the-counter pain relievers such as acetaminophen (Tylenol). If you were prescribed a stronger pain medication, please aware these medication can cause nausea and constipation. Prevent nausea by taking the medication with a snack or meal. Avoid constipation by drinking plenty of fluids and eating foods with high amount of fiber, such as fruits, vegetables, and grains. Take Colase 100 mg (an over-the-counter stool softener) twice a day as needed for constipation. Do not take Tylenol if you are taking prescription pain medications.  Follow Up  Our office will schedule a follow up appointment 2-3 weeks following discharge.  Please call us immediately for any of the following conditions  Severe or worsening pain in your legs or feet while at rest or while walking Increase pain, redness, warmth, or drainage (pus) from your incision site(s) Fever of 101 degree or higher The swelling in your leg with the bypass suddenly worsens and becomes more painful than when you were in the hospital If you have been instructed to feel your graft pulse then you should do so every day. If you can no longer feel this pulse, call the office immediately. Not all patients are given this instruction.  Leg swelling is common after leg bypass surgery.  The swelling should improve over a few months   following surgery. To improve the swelling, you may elevate your legs above the level of your heart while you are sitting or resting. Your surgeon or physician assistant may ask you to apply an ACE wrap or wear compression (TED) stockings to help to reduce swelling.  Reduce your risk of vascular disease  Stop smoking. If you would like help call [facility] at 1-800-QUIT-NOW  (1-800-784-8669) or [place] at 336-586-4000.  Manage your cholesterol Maintain a desired weight Control your diabetes weight Control your diabetes Keep your blood pressure down  If you have any questions, please call the office at [phone]   

## 2023-10-02 NOTE — Discharge Summary (Signed)
Bypass Discharge Summary Patient ID: Tanner Phillips [id number] 67 y.o. 1956/03/16  Admit date: 09/24/2023  Discharge date and time: 10/01/2023  3:52 PM   Admitting Physician: Cephus Shelling, MD   Discharge Physician: Dr. Myra Gianotti  Admission Diagnoses: Critical limb ischemia of both lower extremities (HCC) [I70.223] PAD (peripheral artery disease) (HCC) [I73.9]  Discharge Diagnoses: same  Admission Condition: fair  Discharged Condition: fair  Indication for Admission: post op care  Hospital Course: [name] is a 66 year old male who recently underwent left lower extremity bypass for critical limb ischemia with tissue loss.  He is now indicated for bypass surgery of the right leg due to critical limb ischemia with tissue loss.  He underwent right common femoral to peroneal artery bypass with vein, peroneal endarterectomy, and debridement of the left heel by Dr. Chestine Spore on 09/24/2023.  He tolerated the procedure well and was admitted to the hospital postoperatively.  Postoperative course consisted of pain control, increasing mobility, and wound care.  Wound care involved wet-to-dry dressing changes of contralateral extremity at the open foot wound and surgical incision wounds of the lower leg.  He also developed drainage from the proximal most vein harvest incision of the right leg.  Ultimately a wound VAC was placed.  Home health was arranged by the transition of care team for wound VAC changes twice a week.  He maintained dopplerable signals at the ankle of both lower extremities throughout his hospital stay.  Eliquis was eventually restarted.  He will continue on aspirin and statin daily.  He will follow-up in office in about 2 to 3 weeks for wound check.  He was discharged home with home health in stable condition.  Consults: None  Treatments: surgery: Right common femoral artery to peroneal artery bypass with vein, right peroneal artery endarterectomy, and debridement of the left  heel by Dr. Chestine Spore on 09/24/2023    Disposition: Discharge disposition: 01-Home or Self Care       - For [facility] Registry use ---  Post-op:  Wound infection: No  Graft infection: No  Transfusion: No   New Arrhythmia: No Patency judged by: [ ]  Dopper only, [x ] Palpable graft pulse, [ ]  Palpable distal pulse, [ ]  ABI inc. > 0.15, [ ]  Duplex D/C Ambulatory Status: Ambulatory  Complications: MI: [ x] No, [ ]  Troponin only, [ ]  EKG or Clinical CHF: No Resp failure: [x ] none, [ ]  Pneumonia, [ ]  Ventilator Chg in renal function: [x ] none, [ ]  Inc. Cr > 0.5, [ ]  Temp. Dialysis, [ ]  Permanent dialysis Stroke: [ x] None, [ ]  Minor, [ ]  Major Return to OR: No  Reason for return to OR: [ ]  Bleeding, [ ]  Infection, [ ]  Thrombosis, [ ]  Revision  Discharge medications: Statin use:  Yes ASA use:  Yes Plavix use:  No  for medical reason not indicated Beta blocker use: No  for medical reason not indicated Coumadin use: Yes, Eliquis    Patient Instructions:  Allergies as of 10/01/2023   No Known Allergies      Medication List     TAKE these medications    ALPRAZolam 0.5 MG tablet Commonly known as: XANAX Take 0.5 mg by mouth 3 (three) times daily as needed for anxiety.   amLODipine 10 MG tablet Commonly known as: NORVASC Take 10 mg by mouth daily.   atorvastatin 40 MG tablet Commonly known as: LIPITOR Take 1 tablet (40 mg total) by mouth daily.   collagenase 250  UNIT/GM ointment Commonly known as: SANTYL Apply topically daily.   cyclobenzaprine 10 MG tablet Commonly known as: FLEXERIL Take 10 mg by mouth 2 (two) times daily as needed for muscle spasms.   doxycycline 100 MG capsule Commonly known as: VIBRAMYCIN Take 1 capsule (100 mg total) by mouth 2 (two) times daily.   gabapentin 300 MG capsule Commonly known as: NEURONTIN Take 300 mg by mouth 2 (two) times daily.   HYDROcodone-acetaminophen 5-325 MG tablet Commonly known as: NORCO/VICODIN Take 1 tablet by  mouth every 4 (four) hours as needed for moderate pain (pain score 4-6) or severe pain (pain score 7-10). What changed: when to take this   ibuprofen 200 MG tablet Commonly known as: ADVIL Take 600 mg by mouth as needed for mild pain (pain score 1-3) or moderate pain (pain score 4-6).   zolpidem 10 MG tablet Commonly known as: AMBIEN Take 10 mg by mouth at bedtime.       Activity: activity as tolerated Diet: regular diet Wound Care: keep wound clean and dry; wound vac care per [facility]; wet to dry L leg and foot wounds  Follow-up with [facility] in 2 weeks.  SignedEmilie Rutter 10/02/2023 9:43 AM

## 2023-10-09 ENCOUNTER — Telehealth: Payer: Self-pay

## 2023-10-09 NOTE — Telephone Encounter (Signed)
 Tanner, PT for Home health agency called requesting verbal orders for physical therapy 1x5wks. I discussed with MD after reviewing patient's chart and recent procedure to ensure it was ok to give the verbal orders for patient to start PT. Returned call to physical therapist and after two patient identifiers were used, gave the verbal orders for physical therapy as stated above.

## 2023-10-23 ENCOUNTER — Ambulatory Visit (INDEPENDENT_AMBULATORY_CARE_PROVIDER_SITE_OTHER): Payer: Medicare Other | Admitting: Physician Assistant

## 2023-10-23 VITALS — BP 133/88 | HR 103 | Temp 98.5°F | Resp 20 | Ht 65.0 in | Wt 265.0 lb

## 2023-10-23 DIAGNOSIS — I70223 Atherosclerosis of native arteries of extremities with rest pain, bilateral legs: Secondary | ICD-10-CM

## 2023-10-23 DIAGNOSIS — I739 Peripheral vascular disease, unspecified: Secondary | ICD-10-CM

## 2023-10-23 MED ORDER — HYDROCODONE-ACETAMINOPHEN 5-325 MG PO TABS: 1.0000 | ORAL_TABLET | Freq: Four times a day (QID) | ORAL | 0 refills | Status: DC | PRN

## 2023-10-23 NOTE — Progress Notes (Signed)
 POST OPERATIVE OFFICE NOTE    CC:  F/u for surgery  HPI:  This is a 68 y.o. male who is s/p right common femoral artery to peroneal artery bypass with saphenous vein involving peroneal artery endarterectomy by Dr. Gretta on 09/24/2023.  Prior to that he underwent left femoral to anterior tibial bypass with vein with subsequent fourth and fifth toe amputation and foot debridement.  He denies any rest pain in either foot.  He and his wife believe the wounds are all healing well.  He believes his mobility has drastically improved since discharge from the hospital.  He denies tobacco use.  He is on Eliquis  and statin daily.  He denies fevers, chills, nausea/vomiting.    It should be noted that wound VAC was placed on his proximal most vein harvest incision of the right lower extremity prior to discharge home.  This has since been continued due to improvement in the wound.  No Known Allergies  Current Outpatient Medications  Medication Sig Dispense Refill   ALPRAZolam  (XANAX ) 0.5 MG tablet Take 0.5 mg by mouth 3 (three) times daily as needed for anxiety.     amLODipine  (NORVASC ) 10 MG tablet Take 10 mg by mouth daily.     apixaban  (ELIQUIS ) 5 MG TABS tablet Take 1 tablet (5 mg total) by mouth 2 (two) times daily. 60 tablet 0   atorvastatin  (LIPITOR) 40 MG tablet Take 1 tablet (40 mg total) by mouth daily. 90 tablet 0   collagenase  (SANTYL ) 250 UNIT/GM ointment Apply topically daily. 30 g 0   cyclobenzaprine  (FLEXERIL ) 10 MG tablet Take 10 mg by mouth 2 (two) times daily as needed for muscle spasms.     doxycycline  (VIBRAMYCIN ) 100 MG capsule Take 1 capsule (100 mg total) by mouth 2 (two) times daily. 20 capsule 0   gabapentin  (NEURONTIN ) 300 MG capsule Take 300 mg by mouth 2 (two) times daily.     ibuprofen  (ADVIL ) 200 MG tablet Take 600 mg by mouth as needed for mild pain (pain score 1-3) or moderate pain (pain score 4-6).     zolpidem  (AMBIEN ) 10 MG tablet Take 10 mg by mouth at bedtime.      HYDROcodone -acetaminophen  (NORCO/VICODIN) 5-325 MG tablet Take 1 tablet by mouth every 6 (six) hours as needed for moderate pain (pain score 4-6) or severe pain (pain score 7-10). 20 tablet 0   No current facility-administered medications for this visit.     ROS:  See HPI  Physical Exam:  Vitals:   10/23/23 1414  BP: 133/88  Pulse: (!) 103  Resp: 20  Temp: 98.5 F (36.9 C)  SpO2: 95%  Weight: 265 lb (120.2 kg)  Height: 5' 5 (1.651 m)    Incision: Groin incisions are both healed; right proximalmost vein harvest incision nearly healed; gangrenous toes of the right foot nearly healed; left leg vein harvest incisions are well-appearing; lower leg incision with superficial dehiscence and healthy wound bed; left dorsal foot wound with a healthy appearing wound bed with about 90% granulation tissue; left heel with some fibrinous exudate and about 70% granulation Extremities: Palpable bypass pulse on the right leg around the level of the knee and a brisk DP signal by Doppler; 2+ palpable bypass pulse left leg tunneled laterally and a palpable DP pulse Neuro: A&O  Assessment/Plan:  This is a 68 y.o. male who is s/p: Right femoral to peroneal bypass with vein involving peroneal endarterectomy.  Previously he underwent left femoral to anterior tibial artery bypass with  vein and left foot debridement involving fourth and fifth toe amputation  Bilateral lower extremities are well-perfused on exam.  He has a palpable graft pulse at the level of the knee in the right leg and a brisk DP signal at the foot.  On his left leg he has a strong palpable bypass pulse on his lateral leg as well as a palpable DP pulse.  Overall wounds seem to be improving which is a credit to his wife who has been performing wound care.  The proximalmost vein harvest incision of the right leg has nearly healed.  He will still require wet-to-dry dressing changes on the left lower leg incision, dorsal foot wound, and left heel  wound.  We encouraged him to wash all incisions with soap and water (Dial soap) then pat dry.  He should continue to increase his mobility.  He should avoid pressure to the left heel when possible during the day.  He will continue his Eliquis  and statin daily.  I have refilled his Norco prescription due to ongoing postoperative pain.  Dr. Gretta was involved in the evaluation and management plan of this patient today and is overall very pleased with how he is progressing.  He will return in 1 month with bilateral lower extremity bypass duplexes and ABIs.  He knows to return office sooner with any problems.   Donnice Sender, PA-C [facility] [phone]  Clinic MD:  Gretta

## 2023-10-26 ENCOUNTER — Other Ambulatory Visit (HOSPITAL_COMMUNITY): Payer: Self-pay

## 2023-10-30 ENCOUNTER — Other Ambulatory Visit: Payer: Self-pay | Admitting: Vascular Surgery

## 2023-10-30 ENCOUNTER — Other Ambulatory Visit: Payer: Self-pay

## 2023-10-30 DIAGNOSIS — I739 Peripheral vascular disease, unspecified: Secondary | ICD-10-CM

## 2023-10-31 ENCOUNTER — Other Ambulatory Visit (HOSPITAL_COMMUNITY): Payer: Self-pay

## 2023-10-31 MED ORDER — APIXABAN 5 MG PO TABS
5.0000 mg | ORAL_TABLET | Freq: Two times a day (BID) | ORAL | 0 refills | Status: DC
  Filled 2023-10-31: qty 60, 30d supply, fill #0

## 2023-11-01 ENCOUNTER — Other Ambulatory Visit: Payer: Self-pay

## 2023-11-01 ENCOUNTER — Other Ambulatory Visit (HOSPITAL_COMMUNITY): Payer: Self-pay

## 2023-11-05 ENCOUNTER — Other Ambulatory Visit (HOSPITAL_COMMUNITY): Payer: Self-pay

## 2023-11-05 ENCOUNTER — Telehealth: Payer: Self-pay

## 2023-11-05 NOTE — Telephone Encounter (Signed)
Tanner, PT @ [facility] called to notify us that Mr. Schiffman is being discharged from therapy because he has met all goals.

## 2023-11-06 ENCOUNTER — Telehealth: Payer: Self-pay

## 2023-11-06 NOTE — Telephone Encounter (Signed)
Deanna, RN @ [facility] called to renew [facility] RN order for 1 time per week for 9 weeks.

## 2023-11-19 ENCOUNTER — Other Ambulatory Visit (HOSPITAL_COMMUNITY): Payer: Self-pay | Admitting: Gastroenterology

## 2023-11-19 DIAGNOSIS — K611 Rectal abscess: Secondary | ICD-10-CM

## 2023-11-20 ENCOUNTER — Ambulatory Visit (INDEPENDENT_AMBULATORY_CARE_PROVIDER_SITE_OTHER)
Admission: RE | Admit: 2023-11-20 | Discharge: 2023-11-20 | Disposition: A | Discharge status: Home / Self Care | Payer: Medicare Other | Source: Ambulatory Visit | Attending: Vascular Surgery

## 2023-11-20 ENCOUNTER — Ambulatory Visit (HOSPITAL_COMMUNITY)
Admission: RE | Admit: 2023-11-20 | Discharge: 2023-11-20 | Disposition: A | Discharge status: Home / Self Care | Payer: Medicare Other | Source: Ambulatory Visit | Attending: Vascular Surgery | Admitting: Vascular Surgery

## 2023-11-20 DIAGNOSIS — I739 Peripheral vascular disease, unspecified: Secondary | ICD-10-CM | POA: Diagnosis not present

## 2023-11-23 ENCOUNTER — Ambulatory Visit (HOSPITAL_BASED_OUTPATIENT_CLINIC_OR_DEPARTMENT_OTHER)
Admission: RE | Admit: 2023-11-23 | Discharge: 2023-11-23 | Disposition: A | Discharge status: Home / Self Care | Payer: Medicare Other | Source: Ambulatory Visit | Attending: Gastroenterology | Admitting: Gastroenterology

## 2023-11-23 DIAGNOSIS — K6289 Other specified diseases of anus and rectum: Secondary | ICD-10-CM | POA: Diagnosis present

## 2023-11-23 DIAGNOSIS — K611 Rectal abscess: Secondary | ICD-10-CM

## 2023-11-23 MED ORDER — GADOBUTROL 1 MMOL/ML IV SOLN
10.0000 mL | Freq: Once | INTRAVENOUS | Status: AC | PRN
  Administered 2023-11-23: 10 mL via INTRAVENOUS
  Filled 2023-11-23: qty 10

## 2023-11-27 ENCOUNTER — Ambulatory Visit (INDEPENDENT_AMBULATORY_CARE_PROVIDER_SITE_OTHER): Payer: Medicare Other | Admitting: Physician Assistant

## 2023-11-27 VITALS — BP 149/81 | HR 125 | Temp 98.7°F | Resp 18 | Ht 65.0 in | Wt 267.0 lb

## 2023-11-27 DIAGNOSIS — I70223 Atherosclerosis of native arteries of extremities with rest pain, bilateral legs: Secondary | ICD-10-CM

## 2023-11-27 NOTE — Progress Notes (Signed)
 HISTORY AND PHYSICAL     CC:  follow up. Requesting Provider:  Ralene Ok, MD  HPI: This is a 68 y.o. male who is here today for follow up for PAD.  Pt has hx of left CFA to ATA bypass with non reversed GSV tunneled through the lateral leg on 08/13/2023 by Dr. Chestine Spore.  On 08/16/2023, he underwent left 4th and 5th toe amputation and forefoot debridement by Dr. Hetty Blend.  He underwent right CFA to peroneal artery bypass with reversed GSV tunneled through the saphenectomy incision, right peroneal endarterectomy and debridement of right heel on 09/24/2023 by Dr. Chestine Spore.   Pt was last seen 10/23/2023 and at that time, he was doing well and did not have any rest pain and his wife felt his wounds were healing well.  His mobility had drastically improved.  He did have a wound vac on the most proximal vein harvest site, but was removed due to improvement of the wound.    The pt returns today for follow up.  He states that his wounds are getting better.  His wife is doing wet to dry dressing changes every other day.  He states his wounds are healing.  He states he is walking.  He does not have pain in his feet.   At the end of October, he was found to have an acute DVT oin the left femoral, popliteal, PT and peroneal veins.  He was placed on Eliquis.    The pt is on a statin for cholesterol management.    The pt is not on an aspirin.    Other AC:  Eliquis The pt is on CCB for hypertension.  The pt is not on medication for diabetes. Tobacco hx:  never    Past Medical History:  Diagnosis Date   Chest pain    History of Bell's palsy    History of kidney stones    Hypertension    Knee pain    Myocardial infarction (HCC)    14-15 yrs. ago   Obesity    Slipped cervical disc    SOB (shortness of breath)     Past Surgical History:  Procedure Laterality Date   ABDOMINAL AORTOGRAM W/LOWER EXTREMITY Bilateral 08/10/2023   Procedure: ABDOMINAL AORTOGRAM W/LOWER EXTREMITY;  Surgeon: Leonie Douglas,  MD;  Location: [facility];  Service: Cardiovascular;  Laterality: Bilateral;   AMPUTATION Left 08/16/2023   Procedure: LEFT FOURTH AND FIFTH TOE AMPUTATIONS WITH PLACEMENT OF WOUND VAC;  Surgeon: Daria Pastures, MD;  Location: [facility] OR;  Service: Vascular;  Laterality: Left;   APPLICATION OF WOUND VAC Left 08/13/2023   Procedure: APPLICATION OF WOUND VAC;  Surgeon: Cephus Shelling, MD;  Location: [facility] OR;  Service: Vascular;  Laterality: Left;   BACK SURGERY     LOWER   CHOLECYSTECTOMY     COLONOSCOPY WITH PROPOFOL N/A 06/03/2015   Procedure: COLONOSCOPY WITH PROPOFOL;  Surgeon: Charna Elizabeth, MD;  Location: [facility]OSCOPY;  Service: Endoscopy;  Laterality: N/A;   COLONOSCOPY WITH PROPOFOL N/A 07/27/2020   Procedure: COLONOSCOPY WITH PROPOFOL;  Surgeon: Charna Elizabeth, MD;  Location: [facility] ENDOSCOPY;  Service: Endoscopy;  Laterality: N/A;   ENDARTERECTOMY TIBIOPERONEAL Right 09/24/2023   Procedure: RIGHT ENDARTERECTOMY PERONEAL;  Surgeon: Cephus Shelling, MD;  Location: [facility] OR;  Service: Vascular;  Laterality: Right;   ESOPHAGOGASTRODUODENOSCOPY (EGD) WITH PROPOFOL N/A 07/27/2020   Procedure: ESOPHAGOGASTRODUODENOSCOPY (EGD) WITH PROPOFOL;  Surgeon: Charna Elizabeth, MD;  Location: [facility] ENDOSCOPY;  Service: Endoscopy;  Laterality:  N/A;   FEMORAL-TIBIAL BYPASS GRAFT Left 08/13/2023   Procedure: LEFT COMMON FEMORAL-ANTERIOR TIBIAL ARTERY BYPASS;  Surgeon: Cephus Shelling, MD;  Location: [facility] OR;  Service: Vascular;  Laterality: Left;   FEMORAL-TIBIAL BYPASS GRAFT Right 09/24/2023   Procedure: RIGHT BYPASS GRAFT COMMON FEMORAL-PERONEAL ARTERY USING RIGHT REVERSED GREATER SAPHENOUS VEIN;  Surgeon: Cephus Shelling, MD;  Location: [facility] OR;  Service: Vascular;  Laterality: Right;   Left Shoulder Surgery     Right Shoulder Surgery     THROMBECTOMY OF BYPASS GRAFT FEMORAL- POPLITEAL ARTERY Left 08/13/2023   Procedure: LEFT ANTERIOR TIBIAL THROMBECTOMY;  Surgeon: Cephus Shelling, MD;   Location: [facility] OR;  Service: Vascular;  Laterality: Left;   TOTAL KNEE ARTHROPLASTY Left    VEIN HARVEST Left 08/13/2023   Procedure: VEIN HARVEST OF GREATER SAPHEOUS VEIN;  Surgeon: Cephus Shelling, MD;  Location: [facility] OR;  Service: Vascular;  Laterality: Left;   VEIN HARVEST Right 09/24/2023   Procedure: GREATER SAPHENOUS VEIN HARVEST;  Surgeon: Cephus Shelling, MD;  Location: [facility] OR;  Service: Vascular;  Laterality: Right;   WOUND DEBRIDEMENT Left 09/24/2023   Procedure: DEBRIDEMENT OF LEFT HEEL WOUND;  Surgeon: Cephus Shelling, MD;  Location: [facility] OR;  Service: Vascular;  Laterality: Left;    No Known Allergies  Current Outpatient Medications  Medication Sig Dispense Refill   ALPRAZolam (XANAX) 0.5 MG tablet Take 0.5 mg by mouth 3 (three) times daily as needed for anxiety.     amLODipine (NORVASC) 10 MG tablet Take 10 mg by mouth daily.     apixaban (ELIQUIS) 5 MG TABS tablet Take 1 tablet (5 mg total) by mouth 2 (two) times daily. 60 tablet 0   atorvastatin (LIPITOR) 40 MG tablet Take 1 tablet (40 mg total) by mouth daily. 90 tablet 0   collagenase (SANTYL) 250 UNIT/GM ointment Apply topically daily. 30 g 0   cyclobenzaprine (FLEXERIL) 10 MG tablet Take 10 mg by mouth 2 (two) times daily as needed for muscle spasms.     doxycycline (VIBRAMYCIN) 100 MG capsule Take 1 capsule (100 mg total) by mouth 2 (two) times daily. 20 capsule 0   ELIQUIS 5 MG TABS tablet Take 1 tablet by mouth twice daily 60 tablet 0   gabapentin (NEURONTIN) 300 MG capsule Take 300 mg by mouth 2 (two) times daily.     HYDROcodone-acetaminophen (NORCO/VICODIN) 5-325 MG tablet Take 1 tablet by mouth every 6 (six) hours as needed for moderate pain (pain score 4-6) or severe pain (pain score 7-10). 20 tablet 0   ibuprofen (ADVIL) 200 MG tablet Take 600 mg by mouth as needed for mild pain (pain score 1-3) or moderate pain (pain score 4-6).     zolpidem (AMBIEN) 10 MG tablet Take 10 mg by mouth at bedtime.     No  current facility-administered medications for this visit.   PHYSICAL EXAMINATION:  Today's Vitals   11/27/23 1345 11/27/23 1347  BP: (!) 149/81   Pulse: (!) 125   Resp: 18   Temp: 98.7 F (37.1 C)   TempSrc: Temporal   SpO2: 96%   Weight: 267 lb (121.1 kg)   Height: 5\' 5"  (1.651 m)   PainSc: 6  6   PainLoc: Foot    Body mass index is 44.43 kg/m.   General:  WDWN in NAD; vital signs documented above Gait: Not observed Vascular Exam/Pulses: Left DP pulse is easily palpable and has brisk doppler flow in DP and PT Right DP/PT with brisk  doppler flow Extremities: wound on left foot drastically improved        Non-Invasive Vascular Imaging:   ABI's/TBI's on 11/20/2023: Right:  0.95/0.50 - Great toe pressure: 75 Left:  1.17/1.16 - Great toe pressure: 174  Arterial duplex on 11/20/2023: Right Graft #1: Right common femoral to peroneal bypass  +------------------+--------+--------+----------+--------+                   PSV cm/sStenosisWaveform  Comments  +------------------+--------+--------+----------+--------+  Inflow           142             triphasic           +------------------+--------+--------+----------+--------+  Prox Anastomosis  135             triphasic           +------------------+--------+--------+----------+--------+  Proximal Graft    127             triphasic           +------------------+--------+--------+----------+--------+  Mid Graft         104             triphasic           +------------------+--------+--------+----------+--------+  Distal Graft      57              triphasic           +------------------+--------+--------+----------+--------+  Distal Anastomosis82              triphasic           +------------------+--------+--------+----------+--------+  Outflow          57              monophasicbrisk     +------------------+--------+--------+----------+--------+   Left Graft #1: Left common  femoral to anterior tibial artery bypass.  +--------------------+--------+--------+---------+--------+                     PSV cm/sStenosisWaveform Comments  +--------------------+--------+--------+---------+--------+  Inflow             62              triphasic          +--------------------+--------+--------+---------+--------+  Proximal Anastomosis96              triphasic          +--------------------+--------+--------+---------+--------+  Proximal Graft      58              triphasic          +--------------------+--------+--------+---------+--------+  Mid Graft           69              triphasic          +--------------------+--------+--------+---------+--------+  Distal Graft        126             triphasic          +--------------------+--------+--------+---------+--------+  Distal Anastomosis  132             triphasic          +--------------------+--------+--------+---------+--------+  Outflow            207             triphasic          +--------------------+--------+--------+---------+--------+   Summary:  Patent bilateral bypass grafts with no visualized stenosis    Previous ABI  on 08/08/2023: Right:  0.27 Left:  0.29    ASSESSMENT/PLAN:: 68 y.o. male here for follow up for PAD with hx of  left CFA to ATA bypass with non reversed GSV tunneled through the lateral leg on 08/13/2023 by Dr. Chestine Spore.  On 08/16/2023, he underwent left 4th and 5th toe amputation and forefoot debridement by Dr. Hetty Blend.  He underwent right CFA to peroneal artery bypass with reversed GSV tunneled through the saphenectomy incision, right peroneal endarterectomy and debridement of right heel on 09/24/2023 by Dr. Chestine Spore.   PAD -pt BLE bypasses are patent and ABI's dramatically improved from prior to bypass.  His wounds on the left foot are dramatically improved.   Continue wet to dry dressings.  Discussed doing this daily instead of every other day.    -encouraged him to continue ambulating and elevate his leg when he is not up and about.  DVT -pt was found to have acute DVT in left leg on 08/08/2023 and was placed on Eliquis.  -discussed continuing this until his next visit and discussion with Dr. Chestine Spore can be had if he needs to continue this or if he can transition to a baby asa daily.  I would like for him to continue this for now for bypass patency to promote wound healing.    -continue statin/Eliquis  -pt will f/u in 3 months with BLE arterial duplex/ABI and carotid duplex given this has not been assessed and he has severe PAD BLE .   Doreatha Massed, [facility] [phone]  Clinic MD:   Chestine Spore

## 2023-11-28 ENCOUNTER — Other Ambulatory Visit: Payer: Self-pay

## 2023-11-28 ENCOUNTER — Other Ambulatory Visit: Payer: Self-pay | Admitting: Physician Assistant

## 2023-11-28 MED ORDER — APIXABAN 5 MG PO TABS: 5.0000 mg | ORAL_TABLET | Freq: Two times a day (BID) | ORAL | 0 refills | Status: DC

## 2023-11-28 MED ORDER — APIXABAN 5 MG PO TABS: 5.0000 mg | ORAL_TABLET | Freq: Two times a day (BID) | ORAL | 3 refills | Status: DC

## 2023-12-04 ENCOUNTER — Other Ambulatory Visit: Payer: Self-pay

## 2023-12-04 ENCOUNTER — Telehealth: Payer: Self-pay

## 2023-12-04 DIAGNOSIS — I739 Peripheral vascular disease, unspecified: Secondary | ICD-10-CM

## 2023-12-04 DIAGNOSIS — I70223 Atherosclerosis of native arteries of extremities with rest pain, bilateral legs: Secondary | ICD-10-CM

## 2023-12-04 NOTE — Telephone Encounter (Signed)
 Spoke with pt's HH nurse Deanna with [facility] to give VO to extend [facility] for wound care for 5 more weeks/once per week. No further questions/concerns at this time.

## 2023-12-26 ENCOUNTER — Other Ambulatory Visit: Payer: Self-pay | Admitting: Internal Medicine

## 2024-01-11 NOTE — Telephone Encounter (Signed)
 Pt needs to follow up with PCP

## 2024-01-15 ENCOUNTER — Telehealth: Payer: Self-pay

## 2024-01-15 NOTE — Telephone Encounter (Signed)
 Pt called about pharmacy denying his Lipitor refill from [place]. He has been advised that his PCP is to reorder this, as they will be managing labs on this. Pt verbalized understanding and will call his PCP for refills.

## 2024-01-24 ENCOUNTER — Telehealth: Payer: Self-pay

## 2024-01-24 NOTE — Telephone Encounter (Signed)
 Patient called the TRIAGE line and left message c/o "watery rash" on bilateral feet.  Nurse returned call, LM.

## 2024-01-25 ENCOUNTER — Telehealth: Payer: Self-pay | Admitting: *Deleted

## 2024-01-25 NOTE — Telephone Encounter (Signed)
 Triage call regarding rash, blister on pt's mostly left foot. Very itchy and has moved to the right foot the size of a sliver dollar. Pt instructed to try a cortisol cream and to continue to elevate his legs. Keep the area clean and dry daily.  To help will swelling try ace wraps for compression avoiding the foot . If continues to worsen after trying these interventions pt is to go to there PCP or a local urgent care. If further questions pt can call our office .

## 2024-02-01 ENCOUNTER — Encounter (HOSPITAL_BASED_OUTPATIENT_CLINIC_OR_DEPARTMENT_OTHER): Payer: Self-pay

## 2024-02-01 ENCOUNTER — Other Ambulatory Visit (HOSPITAL_BASED_OUTPATIENT_CLINIC_OR_DEPARTMENT_OTHER): Payer: Self-pay

## 2024-02-01 ENCOUNTER — Ambulatory Visit (HOSPITAL_BASED_OUTPATIENT_CLINIC_OR_DEPARTMENT_OTHER)
Admission: EM | Admit: 2024-02-01 | Discharge: 2024-02-01 | Disposition: A | Discharge status: Home / Self Care | Attending: Family Medicine | Admitting: Family Medicine

## 2024-02-01 DIAGNOSIS — R21 Rash and other nonspecific skin eruption: Secondary | ICD-10-CM

## 2024-02-01 MED ORDER — CLOTRIMAZOLE-BETAMETHASONE 1-0.05 % EX CREA
TOPICAL_CREAM | CUTANEOUS | 0 refills | Status: AC
  Filled 2024-02-01: qty 15, 7d supply, fill #0

## 2024-02-01 MED ORDER — CEPHALEXIN 500 MG PO CAPS
500.0000 mg | ORAL_CAPSULE | Freq: Four times a day (QID) | ORAL | 0 refills | Status: DC
  Filled 2024-02-01: qty 28, 7d supply, fill #0

## 2024-02-01 MED ORDER — TRIAMCINOLONE ACETONIDE 40 MG/ML IJ SUSP
40.0000 mg | Freq: Once | INTRAMUSCULAR | Status: AC
  Administered 2024-02-01: 40 mg via INTRAMUSCULAR

## 2024-02-01 NOTE — ED Provider Notes (Addendum)
 [facility]    CSN: [id number] Arrival date & time: 02/01/24  1039      History   Chief Complaint Chief Complaint  Patient presents with   Rash    HPI Tanner Phillips is a 68 y.o. male.   Reviewed 68 year old male with past medical history of peripheral arterial disease, DVT, cellulitis presents with rash to bilateral feet x 2 weeks. Sees a vascular doctor and it was suggested that patient come to urgent care if continues. States rash and blisters improve slightly overnight but then return when up and moving during the day. Patient states very itchy and painful due to swelling. Has not seen dermatology. On exam, patient has several areas to ankles, lower legs, and feet that are weeping clear fluid. + swelling. Patient reports he keeps legs elevated during the day. Tried cortisone cream for a few days with no relief Denies any new medications, foods or skin products No fever.    Rash   Past Medical History:  Diagnosis Date   Chest pain    History of Bell's palsy    History of kidney stones    Hypertension    Knee pain    Myocardial infarction (HCC)    14-15 yrs. ago   Obesity    Slipped cervical disc    SOB (shortness of breath)     Patient Active Problem List   Diagnosis Date Noted   Critical limb ischemia of both lower extremities (HCC) 09/18/2023   Non-healing surgical wound 09/14/2023   PAD (peripheral artery disease) (HCC) 08/08/2023   DVT, lower extremity (HCC) 08/07/2023   Radiculopathy 08/07/2023   Essential hypertension 08/07/2023   Renal insufficiency 08/07/2023   Cellulitis 08/06/2023   MYOCARDIAL INFARCTION 07/21/2010   PULMONARY NODULE, LEFT LOWER LOBE 07/21/2010    Past Surgical History:  Procedure Laterality Date   ABDOMINAL AORTOGRAM W/LOWER EXTREMITY Bilateral 08/10/2023   Procedure: ABDOMINAL AORTOGRAM W/LOWER EXTREMITY;  Surgeon: Tanner Che, MD;  [facility];  Service: Cardiovascular;  Laterality: Bilateral;    AMPUTATION Left 08/16/2023   Procedure: LEFT FOURTH AND FIFTH TOE AMPUTATIONS WITH PLACEMENT OF WOUND VAC;  Surgeon: Tanner Brawn, MD;  Location: [facility] OR;  Service: Vascular;  Laterality: Left;   APPLICATION OF WOUND VAC Left 08/13/2023   Procedure: APPLICATION OF WOUND VAC;  Surgeon: Tanner Hensen, MD;  Location: [facility] OR;  Service: Vascular;  Laterality: Left;   BACK SURGERY     LOWER   CHOLECYSTECTOMY     COLONOSCOPY WITH PROPOFOL  N/A 06/03/2015   Procedure: COLONOSCOPY WITH PROPOFOL ;  Surgeon: Tanner Falcon, MD;  Location: [facility];  Service: Endoscopy;  Laterality: N/A;   COLONOSCOPY WITH PROPOFOL  N/A 07/27/2020   Procedure: COLONOSCOPY WITH PROPOFOL ;  Surgeon: Tanner Falcon, MD;  Location: [facility];  Service: Endoscopy;  Laterality: N/A;   ENDARTERECTOMY TIBIOPERONEAL Right 09/24/2023   Procedure: RIGHT ENDARTERECTOMY PERONEAL;  Surgeon: Tanner Hensen, MD;  Location: [facility] OR;  Service: Vascular;  Laterality: Right;   ESOPHAGOGASTRODUODENOSCOPY (EGD) WITH PROPOFOL  N/A 07/27/2020   Procedure: ESOPHAGOGASTRODUODENOSCOPY (EGD) WITH PROPOFOL ;  Surgeon: Tanner Falcon, MD;  Location: [facility];  Service: Endoscopy;  Laterality: N/A;   FEMORAL-TIBIAL BYPASS GRAFT Left 08/13/2023   Procedure: LEFT COMMON FEMORAL-ANTERIOR TIBIAL ARTERY BYPASS;  Surgeon: Tanner Hensen, MD;  Location: [facility] OR;  Service: Vascular;  Laterality: Left;   FEMORAL-TIBIAL BYPASS GRAFT Right 09/24/2023   Procedure: RIGHT BYPASS GRAFT COMMON FEMORAL-PERONEAL ARTERY USING RIGHT REVERSED GREATER SAPHENOUS VEIN;  Surgeon: Tanner Hensen, MD;  Location: [facility] OR;  Service: Vascular;  Laterality: Right;   Left Shoulder Surgery     Right Shoulder Surgery     THROMBECTOMY OF BYPASS GRAFT FEMORAL- POPLITEAL ARTERY Left 08/13/2023   Procedure: LEFT ANTERIOR TIBIAL THROMBECTOMY;  Surgeon: Tanner Hensen, MD;  Location: [facility] OR;  Service: Vascular;  Laterality: Left;   TOTAL KNEE ARTHROPLASTY Left    VEIN  HARVEST Left 08/13/2023   Procedure: VEIN HARVEST OF GREATER SAPHEOUS VEIN;  Surgeon: Tanner Hensen, MD;  [facility];  Service: Vascular;  Laterality: Left;   VEIN HARVEST Right 09/24/2023   Procedure: GREATER SAPHENOUS VEIN HARVEST;  Surgeon: Tanner Hensen, MD;  [facility];  Service: Vascular;  Laterality: Right;   WOUND DEBRIDEMENT Left 09/24/2023   Procedure: DEBRIDEMENT OF LEFT HEEL WOUND;  Surgeon: Tanner Hensen, MD;  Location: [facility] OR;  Service: Vascular;  Laterality: Left;       Home Medications    Prior to Admission medications   Medication Sig Start Date End Date Taking? Authorizing Provider  cephALEXin  (KEFLEX ) 500 MG capsule Take 1 capsule (500 mg total) by mouth 4 (four) times daily. 02/01/24  Yes Tanner Phillips A, FNP  clotrimazole -betamethasone  (LOTRISONE ) cream Apply to affected area 2 times daily as needed. 02/01/24  Yes Deserea Bordley A, FNP  ALPRAZolam  (XANAX ) 0.5 MG tablet Take 0.5 mg by mouth 3 (three) times daily as needed for anxiety.    [provider]  amLODipine  (NORVASC ) 10 MG tablet Take 10 mg by mouth daily.    [provider]  apixaban  (ELIQUIS ) 5 MG TABS tablet Take 1 tablet (5 mg total) by mouth 2 (two) times daily. 11/28/23   Schuh, McKenzi P, PA-C  atorvastatin  (LIPITOR) 40 MG tablet Take 1 tablet (40 mg total) by mouth daily. 09/21/23 12/20/23  Tanner Deters, PA-C  collagenase  (SANTYL ) 250 UNIT/GM ointment Apply topically daily. 09/15/23   Butch Cashing, PA-C  cyclobenzaprine  (FLEXERIL ) 10 MG tablet Take 10 mg by mouth 2 (two) times daily as needed for muscle spasms. 06/22/23   [provider]  gabapentin  (NEURONTIN ) 300 MG capsule Take 300 mg by mouth 2 (two) times daily.    [provider]  HYDROcodone -acetaminophen  (NORCO/VICODIN) 5-325 MG tablet Take 1 tablet by mouth every 6 (six) hours as needed for moderate pain (pain score 4-6) or severe pain (pain score 7-10). 10/23/23   Tanner Deters, PA-C   ibuprofen  (ADVIL ) 200 MG tablet Take 600 mg by mouth as needed for mild pain (pain score 1-3) or moderate pain (pain score 4-6).    [provider]  zolpidem  (AMBIEN ) 10 MG tablet Take 10 mg by mouth at bedtime.    [provider]    Family History History reviewed. No pertinent family history.  Social History Social History   Tobacco Use   Smoking status: Never   Smokeless tobacco: Never  Vaping Use   Vaping status: Never Used  Substance Use Topics   Alcohol use: Not Currently   Drug use: No     Allergies   Patient has no known allergies.   Review of Systems Review of Systems  Skin:  Positive for rash.     Physical Exam Triage Vital Signs ED Triage Vitals  Encounter Vitals Group     BP 02/01/24 1108 (!) 140/74     Systolic BP Percentile --      Diastolic BP Percentile --      Pulse Rate  02/01/24 1108 (!) 102     Resp 02/01/24 1108 20     Temp 02/01/24 1108 98.4 F (36.9 C)     Temp Source 02/01/24 1108 Oral     SpO2 02/01/24 1108 94 %     Weight --      Height --      Head Circumference --      Peak Flow --      Pain Score 02/01/24 1111 7     Pain Loc --      Pain Education --      Exclude from Growth Chart --    No data found.  Updated Vital Signs BP (!) 140/74 (BP Location: Right Arm)   Pulse (!) 102   Temp 98.4 F (36.9 C) (Oral)   Resp 20   SpO2 94%   Visual Acuity Right Eye Distance:   Left Eye Distance:   Bilateral Distance:    Right Eye Near:   Left Eye Near:    Bilateral Near:     Physical Exam Constitutional:      General: He is not in acute distress.    Appearance: Normal appearance. He is not ill-appearing or toxic-appearing.  Pulmonary:     Effort: Pulmonary effort is normal.  Musculoskeletal:     Right lower leg: Edema present.     Left lower leg: Edema present.  Skin:    General: Skin is warm and dry.     Findings: Erythema and rash present.     Comments: See picture for detail   Neurological:      Mental Status: He is alert.         UC Treatments / Results  Labs (all labs ordered are listed, but only abnormal results are displayed) Labs Reviewed - No data to display  EKG   Radiology No results found.  Procedures Procedures (including critical care time)  Medications Ordered in UC Medications  triamcinolone  acetonide (KENALOG -40) injection 40 mg (40 mg Intramuscular Given 02/01/24 1208)    Initial Impression / Assessment and Plan / UC Course  I have reviewed the triage vital signs and the nursing notes.  Pertinent labs & imaging results that were available during my care of the patient were reviewed by me and considered in my medical decision making (see chart for details).     Rash-differentials include bullous impetigo, fungal, contact dermatitis, erythema multiforme Reviewed notes and previous pictures sent to vascular surgeon Reviewed case with Dr.Piontek Agreed to cover bases and go ahead and treat for fungal and secondary bacterial infection.  Decided to go ahead and give steroid injection here to in case this is inflammatory and also help with the itching and irritation, swelling. Recommended if this does not improve his condition he will need to see his doctor ER for worsening symptoms  Final Clinical Impressions(s) / UC Diagnoses   Final diagnoses:  Rash and nonspecific skin eruption     Discharge Instructions      I am treating you for possible fungal and secondary staph infection. Use the cream as prescribed. Antibiotics as prescribed Please follow-up with your doctor for any continued issues     ED Prescriptions     Medication Sig Dispense Auth. Provider   cephALEXin  (KEFLEX ) 500 MG capsule Take 1 capsule (500 mg total) by mouth 4 (four) times daily. 28 capsule Lexy Meininger A, FNP   clotrimazole -betamethasone  (LOTRISONE ) cream Apply to affected area 2 times daily as needed. 15 g Landa Pine, FNP  PDMP not reviewed this  encounter.   Landa Pine, FNP 02/01/24 1233    Jerri Morale A, FNP 02/01/24 1234

## 2024-02-01 NOTE — Discharge Instructions (Addendum)
 I am treating you for possible fungal and secondary staph infection. Use the cream as prescribed. Antibiotics as prescribed Please follow-up with your doctor for any continued issues

## 2024-02-01 NOTE — ED Triage Notes (Addendum)
 Rash to bilateral feet x 2 weeks. Sees a vascular doctor and it was suggested that patient come to urgent care if continues. States rash and blisters improve slightly overnight but then return when up and moving during the day. Patient states very itchy and painful due to swelling. Has not seen dermatology. On exam, patient has several areas to ankles, lower legs, and feet that are weeping clear fluid. + swelling. Patient reports he keeps legs elevated during the day.

## 2024-02-04 ENCOUNTER — Other Ambulatory Visit: Payer: Self-pay

## 2024-02-04 ENCOUNTER — Other Ambulatory Visit (HOSPITAL_BASED_OUTPATIENT_CLINIC_OR_DEPARTMENT_OTHER): Payer: Self-pay

## 2024-02-04 MED ORDER — CLOTRIMAZOLE-BETAMETHASONE 1-0.05 % EX CREA
TOPICAL_CREAM | Freq: Two times a day (BID) | CUTANEOUS | 1 refills | Status: DC
  Filled 2024-02-04: qty 45, 23d supply, fill #0
  Filled 2024-02-06: qty 45, 30d supply, fill #0

## 2024-02-06 ENCOUNTER — Other Ambulatory Visit (HOSPITAL_BASED_OUTPATIENT_CLINIC_OR_DEPARTMENT_OTHER): Payer: Self-pay

## 2024-03-14 ENCOUNTER — Other Ambulatory Visit (HOSPITAL_BASED_OUTPATIENT_CLINIC_OR_DEPARTMENT_OTHER): Payer: Self-pay

## 2024-03-14 MED ORDER — BELSOMRA 20 MG PO TABS
20.0000 mg | ORAL_TABLET | Freq: Every day | ORAL | 1 refills | Status: DC
  Filled 2024-03-14: qty 30, 30d supply, fill #0

## 2024-03-25 ENCOUNTER — Ambulatory Visit: Payer: Medicare Other | Attending: Vascular Surgery | Admitting: Vascular Surgery

## 2024-03-25 ENCOUNTER — Encounter: Payer: Self-pay | Admitting: Vascular Surgery

## 2024-03-25 ENCOUNTER — Ambulatory Visit (HOSPITAL_COMMUNITY)
Admission: RE | Admit: 2024-03-25 | Discharge: 2024-03-25 | Disposition: A | Discharge status: Home / Self Care | Payer: Medicare Other | Source: Ambulatory Visit | Attending: Vascular Surgery | Admitting: Vascular Surgery

## 2024-03-25 VITALS — BP 138/82 | Temp 95.0°F | Resp 20 | Ht 65.0 in | Wt 282.6 lb

## 2024-03-25 DIAGNOSIS — I739 Peripheral vascular disease, unspecified: Secondary | ICD-10-CM

## 2024-03-25 DIAGNOSIS — I70223 Atherosclerosis of native arteries of extremities with rest pain, bilateral legs: Secondary | ICD-10-CM

## 2024-03-25 DIAGNOSIS — I82412 Acute embolism and thrombosis of left femoral vein: Secondary | ICD-10-CM | POA: Diagnosis not present

## 2024-03-25 LAB — VAS US ABI WITH/WO TBI
Left ABI: 1.03
Right ABI: 0.95

## 2024-03-25 NOTE — Progress Notes (Signed)
 Patient name: Tanner Phillips MRN: [id number] DOB: 04/02/1956 Sex: male  REASON FOR CONSULT: Surveillance of bilateral lower extremity bypass grafts  HPI: Tanner Phillips is a 68 y.o. male, with history of hypertension and coronary artery disease and peripheral vascular disease that presents for follow-up for surveillance of his bilateral lower extremity bypass grafts.  No lower extremity complaints other than swelling today.  Although the wife states he has been having some drainage from the left lateral foot where his toe amputations were previously performed.  Ongoing for past month and intermittent.    Previously underwent left common femoral to AT bypass with ipsilateral nonreversed great saphenous vein on 08/13/2023 and then left 4th and 5th toe amputations.  Later on 09/14/2023 he underwent a right common femoral to peroneal bypass with reversed saphenous vein.  Past Medical History:  Diagnosis Date   Carotid artery occlusion    Chest pain    History of Bell's palsy    History of kidney stones    Hypertension    Knee pain    Myocardial infarction (HCC)    14-15 yrs. ago   Obesity    Peripheral arterial disease (HCC)    Slipped cervical disc    SOB (shortness of breath)     Past Surgical History:  Procedure Laterality Date   ABDOMINAL AORTOGRAM W/LOWER EXTREMITY Bilateral 08/10/2023   Procedure: ABDOMINAL AORTOGRAM W/LOWER EXTREMITY;  Surgeon: Carlene Che, MD;  [facility]: [facility];  Service: Cardiovascular;  Laterality: Bilateral;   AMPUTATION Left 08/16/2023   Procedure: LEFT FOURTH AND FIFTH TOE AMPUTATIONS WITH PLACEMENT OF WOUND VAC;  Surgeon: Philipp Brawn, MD;  Location: [facility] OR;  Service: Vascular;  Laterality: Left;   APPLICATION OF WOUND VAC Left 08/13/2023   Procedure: APPLICATION OF WOUND VAC;  Surgeon: Young Hensen, MD;  [facility];  Service: Vascular;  Laterality: Left;   BACK SURGERY     LOWER   CHOLECYSTECTOMY     COLONOSCOPY WITH  PROPOFOL  N/A 06/03/2015   Procedure: COLONOSCOPY WITH PROPOFOL ;  Surgeon: Tami Falcon, MD;  Location: [facility];  Service: Endoscopy;  Laterality: N/A;   COLONOSCOPY WITH PROPOFOL  N/A 07/27/2020   Procedure: COLONOSCOPY WITH PROPOFOL ;  Surgeon: Tami Falcon, MD;  Location: [facility];  Service: Endoscopy;  Laterality: N/A;   ENDARTERECTOMY TIBIOPERONEAL Right 09/24/2023   Procedure: RIGHT ENDARTERECTOMY PERONEAL;  Surgeon: Young Hensen, MD;  [facility];  Service: Vascular;  Laterality: Right;   ESOPHAGOGASTRODUODENOSCOPY (EGD) WITH PROPOFOL  N/A 07/27/2020   Procedure: ESOPHAGOGASTRODUODENOSCOPY (EGD) WITH PROPOFOL ;  Surgeon: Tami Falcon, MD;  Location: [facility];  Service: Endoscopy;  Laterality: N/A;   FEMORAL-TIBIAL BYPASS GRAFT Left 08/13/2023   Procedure: LEFT COMMON FEMORAL-ANTERIOR TIBIAL ARTERY BYPASS;  Surgeon: Young Hensen, MD;  Location: [facility] OR;  Service: Vascular;  Laterality: Left;   FEMORAL-TIBIAL BYPASS GRAFT Right 09/24/2023   Procedure: RIGHT BYPASS GRAFT COMMON FEMORAL-PERONEAL ARTERY USING RIGHT REVERSED GREATER SAPHENOUS VEIN;  Surgeon: Young Hensen, MD;  [facility];  Service: Vascular;  Laterality: Right;   Left Shoulder Surgery     Right Shoulder Surgery     THROMBECTOMY OF BYPASS GRAFT FEMORAL- POPLITEAL ARTERY Left 08/13/2023   Procedure: LEFT ANTERIOR TIBIAL THROMBECTOMY;  Surgeon: Young Hensen, MD;  Location: [facility] OR;  Service: Vascular;  Laterality: Left;   TOTAL KNEE ARTHROPLASTY Left    VEIN HARVEST Left 08/13/2023   Procedure: VEIN HARVEST OF GREATER SAPHEOUS VEIN;  Surgeon: Jimmye Moulds  J, MD;  Location: [facility] OR;  Service: Vascular;  Laterality: Left;   VEIN HARVEST Right 09/24/2023   Procedure: GREATER SAPHENOUS VEIN HARVEST;  Surgeon: Young Hensen, MD;  Location: [facility] OR;  Service: Vascular;  Laterality: Right;   WOUND DEBRIDEMENT Left 09/24/2023   Procedure: DEBRIDEMENT OF LEFT HEEL WOUND;  Surgeon:  Young Hensen, MD;  Location: [facility] OR;  Service: Vascular;  Laterality: Left;    History reviewed. No pertinent family history.  SOCIAL HISTORY: Social History   Socioeconomic History   Marital status: Married    Spouse name: Not on file   Number of children: Not on file   Years of education: Not on file   Highest education level: Not on file  Occupational History   Not on file  Tobacco Use   Smoking status: Never   Smokeless tobacco: Never  Vaping Use   Vaping status: Never Used  Substance and Sexual Activity   Alcohol use: Not Currently   Drug use: No   Sexual activity: Not on file  Other Topics Concern   Not on file  Social History Narrative   MARRIED -WORKS AT [employer]   Social Drivers of Health   Financial Resource Strain: Not on file  Food Insecurity: No Food Insecurity (09/24/2023)   Hunger Vital Sign    Worried About Running Out of Food in the Last Year: Never true    Ran Out of Food in the Last Year: Never true  Transportation Needs: No Transportation Needs (09/24/2023)   PRAPARE - Administrator, Civil Service (Medical): No    Lack of Transportation (Non-Medical): No  Physical Activity: Not on file  Stress: Not on file  Social Connections: Not on file  Intimate Partner Violence: Not At Risk (09/24/2023)   Humiliation, Afraid, Rape, and Kick questionnaire    Fear of Current or Ex-Partner: No    Emotionally Abused: No    Physically Abused: No    Sexually Abused: No    No Known Allergies  Current Outpatient Medications  Medication Sig Dispense Refill   ALPRAZolam  (XANAX ) 0.5 MG tablet Take 0.5 mg by mouth 3 (three) times daily as needed for anxiety.     amLODipine  (NORVASC ) 10 MG tablet Take 10 mg by mouth daily.     apixaban  (ELIQUIS ) 5 MG TABS tablet Take 1 tablet (5 mg total) by mouth 2 (two) times daily. 60 tablet 3   atorvastatin  (LIPITOR) 40 MG tablet Take 1 tablet (40 mg total) by mouth daily. 90 tablet 0   cephALEXin   (KEFLEX ) 500 MG capsule Take 1 capsule (500 mg total) by mouth 4 (four) times daily. 28 capsule 0   clotrimazole -betamethasone  (LOTRISONE ) cream Apply to affected area 2 times daily as needed. 15 g 0   clotrimazole -betamethasone  (LOTRISONE ) cream Apply 0.5 inch topically 2 (two) times daily. 45 g 1   collagenase  (SANTYL ) 250 UNIT/GM ointment Apply topically daily. 30 g 0   cyclobenzaprine  (FLEXERIL ) 10 MG tablet Take 10 mg by mouth 2 (two) times daily as needed for muscle spasms.     gabapentin  (NEURONTIN ) 300 MG capsule Take 300 mg by mouth 2 (two) times daily.     HYDROcodone -acetaminophen  (NORCO/VICODIN) 5-325 MG tablet Take 1 tablet by mouth every 6 (six) hours as needed for moderate pain (pain score 4-6) or severe pain (pain score 7-10). 20 tablet 0   ibuprofen  (ADVIL ) 200 MG tablet Take 600 mg by mouth as needed for mild pain (pain score 1-3)  or moderate pain (pain score 4-6).     Semaglutide-Weight Management (WEGOVY) 0.25 MG/0.5ML SOAJ Inject 0.25 mg into the skin once a week.     Suvorexant  (BELSOMRA ) 20 MG TABS Take one tablet by mouth once per night within 30 minutes of bedtime. (Only if at least 7 hrs remain before time of waking) 30 tablet 1   zolpidem  (AMBIEN ) 10 MG tablet Take 10 mg by mouth at bedtime.     No current facility-administered medications for this visit.    REVIEW OF SYSTEMS:  [X]  denotes positive finding, [ ]  denotes negative finding Cardiac  Comments:  Chest pain or chest pressure:    Shortness of breath upon exertion:    Short of breath when lying flat:    Irregular heart rhythm:        Vascular    Pain in calf, thigh, or hip brought on by ambulation:    Pain in feet at night that wakes you up from your sleep:     Blood clot in your veins:    Leg swelling:         Pulmonary    Oxygen at home:    Productive cough:     Wheezing:         Neurologic    Sudden weakness in arms or legs:     Sudden numbness in arms or legs:     Sudden onset of difficulty  speaking or slurred speech:    Temporary loss of vision in one eye:     Problems with dizziness:         Gastrointestinal    Blood in stool:     Vomited blood:         Genitourinary    Burning when urinating:     Blood in urine:        Psychiatric    Major depression:         Hematologic    Bleeding problems:    Problems with blood clotting too easily:        Skin    Rashes or ulcers:        Constitutional    Fever or chills:      PHYSICAL EXAM: Vitals:   03/25/24 1357  BP: 138/82  Resp: 20  Temp: (!) 95 F (35 C)  TempSrc: Temporal  SpO2: 93%  Weight: 282 lb 9.6 oz (128.2 kg)  Height: 5' 5 (1.651 m)    GENERAL: The patient is a well-nourished male, in no acute distress. The vital signs are documented above. CARDIAC: There is a regular rate and rhythm.  VASCULAR:  Palpable pulse right fem peroneal bypass graft Brisk triphasic right peroneal and DP signal Palpable left DP pulse PULMONARY: No respiratory distress. ABDOMEN: Soft and non-tender. MUSCULOSKELETAL: There are no major deformities or cyanosis. NEUROLOGIC: No focal weakness or paresthesias are detected. SKIN: There are no ulcers or rashes noted. PSYCHIATRIC: The patient has a normal affect.    DATA:   ABIs today are 0.95 on the right triphasic and 1.03 on the left triphasic  Bilateral lower extremity arterial duplex shows patent right femoral to peroneal bypass and patent left common femoral to anterior tibial bypass without stenosis.  Carotid ultrasound shows minimal 1 to 39% stenosis bilaterally  Assessment/Plan:  69 y.o. male, with history of hypertension and coronary artery disease and peripheral vascular disease that presents for follow-up for surveillance of his bilateral lower extremity bypass grafts.  Previously underwent left common femoral to AT  bypass with ipsilateral nonreversed great saphenous vein on 08/13/2023 and then left 4th and 5th toe amputations.  Later on 09/14/2023 he  underwent a right common femoral to peroneal bypass with reversed saphenous vein.  This was all for CLI with tissue loss.  His wounds ultimately healed.  Discussed both bypass grafts are widely patent on duplex today.  I have recommended MRI given some persistent drainage of his left lateral foot where he has had prior toe amputations that have healed.  Plan rule out any evidence of underlying chronic osteomyelitis.  Discussed he can follow-up with me after the MRI.  Discussed his carotid duplex has minimal 1 to 39% stenosis bilaterally.  No indication for intervention on his carotid disease and discussed with follow-up carotid ultrasound in like 2 years.  For asymptomatic disease recommend surgery for over 80%.  I think he can stop his Eliquis  as this was started for DVT in October 2024 and this was a provoked first-time DVT.  Discussed aspirin  statin for risk reduction.   Young Hensen, MD [facility] Office: [phone]

## 2024-03-26 ENCOUNTER — Other Ambulatory Visit: Payer: Self-pay

## 2024-03-26 ENCOUNTER — Other Ambulatory Visit (HOSPITAL_BASED_OUTPATIENT_CLINIC_OR_DEPARTMENT_OTHER): Payer: Self-pay

## 2024-03-26 DIAGNOSIS — I739 Peripheral vascular disease, unspecified: Secondary | ICD-10-CM

## 2024-03-26 MED ORDER — CLOTRIMAZOLE-BETAMETHASONE 1-0.05 % EX CREA
TOPICAL_CREAM | Freq: Two times a day (BID) | CUTANEOUS | 1 refills | Status: DC
  Filled 2024-03-26: qty 45, 15d supply, fill #0

## 2024-04-07 ENCOUNTER — Other Ambulatory Visit (HOSPITAL_BASED_OUTPATIENT_CLINIC_OR_DEPARTMENT_OTHER): Payer: Self-pay

## 2024-04-07 MED ORDER — CLOTRIMAZOLE-BETAMETHASONE 1-0.05 % EX CREA
TOPICAL_CREAM | CUTANEOUS | 1 refills | Status: DC
  Filled 2024-04-07: qty 45, 30d supply, fill #0

## 2024-04-07 MED ORDER — FUROSEMIDE 40 MG PO TABS
40.0000 mg | ORAL_TABLET | Freq: Every day | ORAL | 0 refills | Status: AC | PRN
  Filled 2024-04-07: qty 30, 30d supply, fill #0

## 2024-04-08 ENCOUNTER — Ambulatory Visit
Admission: RE | Admit: 2024-04-08 | Discharge: 2024-04-08 | Disposition: A | Discharge status: Home / Self Care | Source: Ambulatory Visit | Attending: Vascular Surgery | Admitting: Vascular Surgery

## 2024-04-08 DIAGNOSIS — I739 Peripheral vascular disease, unspecified: Secondary | ICD-10-CM

## 2024-04-08 MED ORDER — GADOPICLENOL 0.5 MMOL/ML IV SOLN
10.0000 mL | Freq: Once | INTRAVENOUS | Status: AC | PRN
  Administered 2024-04-08: 10 mL via INTRAVENOUS

## 2024-04-22 ENCOUNTER — Telehealth: Payer: Self-pay

## 2024-04-22 NOTE — Telephone Encounter (Addendum)
 Called pt to follow up on his foot pain he reported through his MyChart on 04/19/24.  No answer, left message to call back.  Patient returned call and Triage nurse able to talk to him about the pain in heel. Pt reported it gets worse as the day goes on. Pt reported think yellow goo on the incision of the top of his foot.  Advised pt to follow-up with this PCP. Patient verbally agreed.  Patient advised to call back or go to the ED if signs of infection (increased pain, fever, red streaks, puss-like discharge) become evident.

## 2024-05-02 ENCOUNTER — Other Ambulatory Visit (HOSPITAL_BASED_OUTPATIENT_CLINIC_OR_DEPARTMENT_OTHER): Payer: Self-pay

## 2024-05-06 ENCOUNTER — Encounter: Payer: Self-pay | Admitting: Vascular Surgery

## 2024-05-06 ENCOUNTER — Ambulatory Visit: Attending: Vascular Surgery | Admitting: Vascular Surgery

## 2024-05-06 VITALS — BP 137/82 | HR 105 | Temp 98.7°F | Resp 20 | Ht 65.0 in | Wt 285.2 lb

## 2024-05-06 DIAGNOSIS — I70223 Atherosclerosis of native arteries of extremities with rest pain, bilateral legs: Secondary | ICD-10-CM

## 2024-05-06 NOTE — Progress Notes (Signed)
 Patient name: Tanner Phillips MRN: [id number] DOB: 1956-02-05 Sex: male  REASON FOR CONSULT: Follow-up after MRI foot, hx bilateral lower extremity bypass grafts  HPI: Tanner Phillips is a 68 y.o. male, with history of hypertension and coronary artery disease and peripheral vascular disease that presents for follow-up after MRI of his left foot for small area drainage.  Previously underwent left common femoral to AT bypass with ipsilateral nonreversed great saphenous vein on 08/13/2023 and then left 4th and 5th toe amputations.  Later on 09/14/2023 he underwent a right common femoral to peroneal bypass with reversed saphenous vein.  Today complains of a new area on his left heel where there is a crack  Past Medical History:  Diagnosis Date   Carotid artery occlusion    Chest pain    History of Bell's palsy    History of kidney stones    Hypertension    Knee pain    Myocardial infarction (HCC)    14-15 yrs. ago   Obesity    Peripheral arterial disease (HCC)    Slipped cervical disc    SOB (shortness of breath)     Past Surgical History:  Procedure Laterality Date   ABDOMINAL AORTOGRAM W/LOWER EXTREMITY Bilateral 08/10/2023   Procedure: ABDOMINAL AORTOGRAM W/LOWER EXTREMITY;  Surgeon: Magda Debby SAILOR, MD;  Location: [facility];  Service: Cardiovascular;  Laterality: Bilateral;   AMPUTATION Left 08/16/2023   Procedure: LEFT FOURTH AND FIFTH TOE AMPUTATIONS WITH PLACEMENT OF WOUND VAC;  Surgeon: Pearline Norman GORMAN, MD;  Location: [facility] OR;  Service: Vascular;  Laterality: Left;   APPLICATION OF WOUND VAC Left 08/13/2023   Procedure: APPLICATION OF WOUND VAC;  Surgeon: Gretta Lonni PARAS, MD;  Location: [facility] OR;  Service: Vascular;  Laterality: Left;   BACK SURGERY     LOWER   CHOLECYSTECTOMY     COLONOSCOPY WITH PROPOFOL  N/A 06/03/2015   Procedure: COLONOSCOPY WITH PROPOFOL ;  Surgeon: Renaye Sous, MD;  Location: [facility];  Service: Endoscopy;  Laterality: N/A;   COLONOSCOPY  WITH PROPOFOL  N/A 07/27/2020   Procedure: COLONOSCOPY WITH PROPOFOL ;  Surgeon: Sous Renaye, MD;  Location: [facility];  Service: Endoscopy;  Laterality: N/A;   ENDARTERECTOMY TIBIOPERONEAL Right 09/24/2023   Procedure: RIGHT ENDARTERECTOMY PERONEAL;  Surgeon: Gretta Lonni PARAS, MD;  Location: [facility] OR;  Service: Vascular;  Laterality: Right;   ESOPHAGOGASTRODUODENOSCOPY (EGD) WITH PROPOFOL  N/A 07/27/2020   Procedure: ESOPHAGOGASTRODUODENOSCOPY (EGD) WITH PROPOFOL ;  Surgeon: Sous Renaye, MD;  Location: [facility];  Service: Endoscopy;  Laterality: N/A;   FEMORAL-TIBIAL BYPASS GRAFT Left 08/13/2023   Procedure: LEFT COMMON FEMORAL-ANTERIOR TIBIAL ARTERY BYPASS;  Surgeon: Gretta Lonni PARAS, MD;  Location: [facility] OR;  Service: Vascular;  Laterality: Left;   FEMORAL-TIBIAL BYPASS GRAFT Right 09/24/2023   Procedure: RIGHT BYPASS GRAFT COMMON FEMORAL-PERONEAL ARTERY USING RIGHT REVERSED GREATER SAPHENOUS VEIN;  Surgeon: Gretta Lonni PARAS, MD;  Location: [facility] OR;  Service: Vascular;  Laterality: Right;   Left Shoulder Surgery     Right Shoulder Surgery     THROMBECTOMY OF BYPASS GRAFT FEMORAL- POPLITEAL ARTERY Left 08/13/2023   Procedure: LEFT ANTERIOR TIBIAL THROMBECTOMY;  Surgeon: Gretta Lonni PARAS, MD;  Location: [facility];  Service: Vascular;  Laterality: Left;   TOTAL KNEE ARTHROPLASTY Left    VEIN HARVEST Left 08/13/2023   Procedure: VEIN HARVEST OF GREATER SAPHEOUS VEIN;  Surgeon: Gretta Lonni PARAS, MD;  Location: [facility] OR;  Service: Vascular;  Laterality: Left;   VEIN HARVEST Right 09/24/2023   Procedure:  GREATER SAPHENOUS VEIN HARVEST;  Surgeon: Gretta Lonni PARAS, MD;  Location: [facility] OR;  Service: Vascular;  Laterality: Right;   WOUND DEBRIDEMENT Left 09/24/2023   Procedure: DEBRIDEMENT OF LEFT HEEL WOUND;  Surgeon: Gretta Lonni PARAS, MD;  Location: [facility] OR;  Service: Vascular;  Laterality: Left;    History reviewed. No pertinent family history.  SOCIAL HISTORY: Social History    Socioeconomic History   Marital status: Married    Spouse name: Not on file   Number of children: Not on file   Years of education: Not on file   Highest education level: Not on file  Occupational History   Not on file  Tobacco Use   Smoking status: Never   Smokeless tobacco: Never  Vaping Use   Vaping status: Never Used  Substance and Sexual Activity   Alcohol use: Not Currently   Drug use: No   Sexual activity: Not on file  Other Topics Concern   Not on file  Social History Narrative   MARRIED -WORKS AT [employer]   Social Drivers of Health   Financial Resource Strain: Not on file  Food Insecurity: No Food Insecurity (09/24/2023)   Hunger Vital Sign    Worried About Running Out of Food in the Last Year: Never true    Ran Out of Food in the Last Year: Never true  Transportation Needs: No Transportation Needs (09/24/2023)   PRAPARE - Administrator, Civil Service (Medical): No    Lack of Transportation (Non-Medical): No  Physical Activity: Not on file  Stress: Not on file  Social Connections: Not on file  Intimate Partner Violence: Not At Risk (09/24/2023)   Humiliation, Afraid, Rape, and Kick questionnaire    Fear of Current or Ex-Partner: No    Emotionally Abused: No    Physically Abused: No    Sexually Abused: No    No Known Allergies  Current Outpatient Medications  Medication Sig Dispense Refill   ALPRAZolam  (XANAX ) 0.5 MG tablet Take 0.5 mg by mouth 3 (three) times daily as needed for anxiety.     amLODipine  (NORVASC ) 10 MG tablet Take 10 mg by mouth daily.     apixaban  (ELIQUIS ) 5 MG TABS tablet Take 1 tablet (5 mg total) by mouth 2 (two) times daily. 60 tablet 3   atorvastatin  (LIPITOR) 40 MG tablet Take 1 tablet (40 mg total) by mouth daily. 90 tablet 0   cephALEXin  (KEFLEX ) 500 MG capsule Take 1 capsule (500 mg total) by mouth 4 (four) times daily. 28 capsule 0   clotrimazole -betamethasone  (LOTRISONE ) cream Apply to affected area 2  times daily as needed. 15 g 0   clotrimazole -betamethasone  (LOTRISONE ) cream Apply 0.5 inch topically 2 (two) times daily. 45 g 1   clotrimazole -betamethasone  (LOTRISONE ) cream Apply one half inches topically 2 (two) times daily. 45 g 1   clotrimazole -betamethasone  (LOTRISONE ) cream Apply one half inch by topical route twice daily 45 g 1   collagenase  (SANTYL ) 250 UNIT/GM ointment Apply topically daily. 30 g 0   cyclobenzaprine  (FLEXERIL ) 10 MG tablet Take 10 mg by mouth 2 (two) times daily as needed for muscle spasms.     furosemide  (LASIX ) 40 MG tablet Take 1 tablet (40 mg total) by mouth daily as needed. 30 tablet 0   gabapentin  (NEURONTIN ) 300 MG capsule Take 300 mg by mouth 2 (two) times daily.     HYDROcodone -acetaminophen  (NORCO/VICODIN) 5-325 MG tablet Take 1 tablet by mouth every 6 (six) hours as needed for  moderate pain (pain score 4-6) or severe pain (pain score 7-10). 20 tablet 0   ibuprofen  (ADVIL ) 200 MG tablet Take 600 mg by mouth as needed for mild pain (pain score 1-3) or moderate pain (pain score 4-6).     Semaglutide-Weight Management (WEGOVY) 0.25 MG/0.5ML SOAJ Inject 0.25 mg into the skin once a week.     Suvorexant  (BELSOMRA ) 20 MG TABS Take one tablet by mouth once per night within 30 minutes of bedtime. (Only if at least 7 hrs remain before time of waking) 30 tablet 1   zolpidem  (AMBIEN ) 10 MG tablet Take 10 mg by mouth at bedtime.     No current facility-administered medications for this visit.    REVIEW OF SYSTEMS:  [X]  denotes positive finding, [ ]  denotes negative finding Cardiac  Comments:  Chest pain or chest pressure:    Shortness of breath upon exertion:    Short of breath when lying flat:    Irregular heart rhythm:        Vascular    Pain in calf, thigh, or hip brought on by ambulation:    Pain in feet at night that wakes you up from your sleep:     Blood clot in your veins:    Leg swelling:         Pulmonary    Oxygen at home:    Productive cough:      Wheezing:         Neurologic    Sudden weakness in arms or legs:     Sudden numbness in arms or legs:     Sudden onset of difficulty speaking or slurred speech:    Temporary loss of vision in one eye:     Problems with dizziness:         Gastrointestinal    Blood in stool:     Vomited blood:         Genitourinary    Burning when urinating:     Blood in urine:        Psychiatric    Major depression:         Hematologic    Bleeding problems:    Problems with blood clotting too easily:        Skin    Rashes or ulcers:        Constitutional    Fever or chills:      PHYSICAL EXAM: Vitals:   05/06/24 1353  BP: 137/82  Pulse: (!) 105  Resp: 20  Temp: 98.7 F (37.1 C)  TempSrc: Temporal  SpO2: 92%  Weight: 285 lb 3.2 oz (129.4 kg)  Height: 5' 5 (1.651 m)    GENERAL: The patient is a well-nourished male, in no acute distress. The vital signs are documented above. CARDIAC: There is a regular rate and rhythm.  VASCULAR:  Palpable pulse right fem peroneal bypass graft Palpable left DP pulse PULMONARY: No respiratory distress. ABDOMEN: Soft and non-tender. MUSCULOSKELETAL: There are no major deformities or cyanosis. NEUROLOGIC: No focal weakness or paresthesias are detected. SKIN: There are no ulcers or rashes noted. PSYCHIATRIC: The patient has a normal affect.    DATA:   ABIs previously were 0.95 on the right triphasic and 1.03 on the left triphasic  Bilateral lower extremity arterial duplex shows patent right femoral to peroneal bypass and patent left common femoral to anterior tibial bypass without stenosis.  Carotid ultrasound shows minimal 1 to 39% stenosis bilaterally  Assessment/Plan:  68 y.o. male, with history of  hypertension and coronary artery disease and peripheral vascular disease that presents for follow-up after MRI of his left foot for small area drainage.  Previously underwent left common femoral to AT bypass with ipsilateral nonreversed  great saphenous vein on 08/13/2023 and then left 4th and 5th toe amputations.  Later on 09/14/2023 he underwent a right common femoral to peroneal bypass with reversed saphenous vein.  Both bypasses were widely patent on last duplex imaging at last visit.  I did send him for MRI of the left foot.  No evidence of osteomyelitis or other drainable fluid collection as we discussed today.  He again has palpable pulses in both feet today.  Will arrange follow-up in 6 months with lower extremity arterial duplexes.  Does have a small crack on the back of his left heel that is very painful.  I discussed gentle Ace wrap's to help with swelling and making sure he has proper fitting shoes and trying some moleskin.   Lonni DOROTHA Gaskins, MD [facility] Office: [phone]

## 2024-05-07 ENCOUNTER — Other Ambulatory Visit: Payer: Self-pay | Admitting: *Deleted

## 2024-05-07 DIAGNOSIS — I739 Peripheral vascular disease, unspecified: Secondary | ICD-10-CM

## 2024-05-07 DIAGNOSIS — I70223 Atherosclerosis of native arteries of extremities with rest pain, bilateral legs: Secondary | ICD-10-CM

## 2024-05-26 ENCOUNTER — Other Ambulatory Visit (HOSPITAL_BASED_OUTPATIENT_CLINIC_OR_DEPARTMENT_OTHER): Payer: Self-pay

## 2024-06-12 ENCOUNTER — Other Ambulatory Visit (HOSPITAL_BASED_OUTPATIENT_CLINIC_OR_DEPARTMENT_OTHER): Payer: Self-pay

## 2024-09-25 ENCOUNTER — Ambulatory Visit: Admission: RE | Admit: 2024-09-25 | Source: Ambulatory Visit

## 2024-09-25 ENCOUNTER — Other Ambulatory Visit: Payer: Self-pay | Admitting: Internal Medicine

## 2024-09-25 DIAGNOSIS — R059 Cough, unspecified: Secondary | ICD-10-CM

## 2024-11-11 ENCOUNTER — Ambulatory Visit: Admitting: Vascular Surgery

## 2024-11-11 ENCOUNTER — Encounter: Payer: Self-pay | Admitting: Vascular Surgery

## 2024-11-11 ENCOUNTER — Ambulatory Visit (HOSPITAL_BASED_OUTPATIENT_CLINIC_OR_DEPARTMENT_OTHER)
Admission: RE | Admit: 2024-11-11 | Discharge: 2024-11-11 | Disposition: A | Source: Ambulatory Visit | Attending: Surgery | Admitting: Surgery

## 2024-11-11 ENCOUNTER — Ambulatory Visit (HOSPITAL_COMMUNITY)
Admission: RE | Admit: 2024-11-11 | Discharge: 2024-11-11 | Disposition: A | Source: Ambulatory Visit | Attending: Surgery | Admitting: Surgery

## 2024-11-11 VITALS — BP 136/82 | HR 100 | Temp 98.8°F | Resp 22 | Ht 65.0 in | Wt 267.3 lb

## 2024-11-11 DIAGNOSIS — I70223 Atherosclerosis of native arteries of extremities with rest pain, bilateral legs: Secondary | ICD-10-CM | POA: Diagnosis not present

## 2024-11-11 DIAGNOSIS — I739 Peripheral vascular disease, unspecified: Secondary | ICD-10-CM

## 2024-11-11 LAB — VAS US ABI WITH/WO TBI
Left ABI: 0.95
Right ABI: 0.98

## 2024-11-11 MED ORDER — ATORVASTATIN CALCIUM 40 MG PO TABS: 40.0000 mg | ORAL_TABLET | Freq: Every day | ORAL | 4 refills | Status: AC

## 2024-11-18 ENCOUNTER — Ambulatory Visit: Admitting: Podiatry
